# Patient Record
Sex: Female | Born: 1955 | Race: White | Hispanic: No | Smoking: Never smoker
Health system: Southern US, Community
[De-identification: ages and names within clinical notes are randomized; demographics above are authoritative.]

## PROBLEM LIST (undated history)

## (undated) DIAGNOSIS — F419 Anxiety disorder, unspecified: Secondary | ICD-10-CM

## (undated) DIAGNOSIS — R7303 Prediabetes: Secondary | ICD-10-CM

## (undated) DIAGNOSIS — T7840XA Allergy, unspecified, initial encounter: Secondary | ICD-10-CM

## (undated) DIAGNOSIS — E785 Hyperlipidemia, unspecified: Secondary | ICD-10-CM

## (undated) DIAGNOSIS — E119 Type 2 diabetes mellitus without complications: Secondary | ICD-10-CM

## (undated) DIAGNOSIS — J302 Other seasonal allergic rhinitis: Secondary | ICD-10-CM

## (undated) DIAGNOSIS — E669 Obesity, unspecified: Secondary | ICD-10-CM

## (undated) DIAGNOSIS — I10 Essential (primary) hypertension: Secondary | ICD-10-CM

## (undated) HISTORY — DX: Type 2 diabetes mellitus without complications: E11.9

## (undated) HISTORY — PX: COLONOSCOPY: SHX174

## (undated) HISTORY — DX: Hyperlipidemia, unspecified: E78.5

## (undated) HISTORY — DX: Anxiety disorder, unspecified: F41.9

## (undated) HISTORY — DX: Allergy, unspecified, initial encounter: T78.40XA

## (undated) HISTORY — DX: Obesity, unspecified: E66.9

## (undated) HISTORY — DX: Essential (primary) hypertension: I10

## (undated) HISTORY — DX: Prediabetes: R73.03

## (undated) HISTORY — DX: Other seasonal allergic rhinitis: J30.2

---
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
## 1995-05-19 HISTORY — PX: FRACTURE SURGERY: SHX138

## 2000-02-16 ENCOUNTER — Encounter: Admission: RE | Admit: 2000-02-16 | Discharge: 2000-02-16 | Payer: Self-pay | Admitting: Gynecology

## 2000-02-16 ENCOUNTER — Encounter: Payer: Self-pay | Admitting: Gynecology

## 2001-03-09 ENCOUNTER — Encounter: Admission: RE | Admit: 2001-03-09 | Discharge: 2001-03-09 | Payer: Self-pay | Admitting: Gynecology

## 2001-03-09 ENCOUNTER — Encounter: Payer: Self-pay | Admitting: Gynecology

## 2001-07-07 ENCOUNTER — Other Ambulatory Visit: Admission: RE | Admit: 2001-07-07 | Discharge: 2001-07-07 | Payer: Self-pay | Admitting: Family Medicine

## 2002-03-10 ENCOUNTER — Encounter: Payer: Self-pay | Admitting: Family Medicine

## 2002-03-10 ENCOUNTER — Encounter: Admission: RE | Admit: 2002-03-10 | Discharge: 2002-03-10 | Payer: Self-pay | Admitting: Family Medicine

## 2003-02-22 ENCOUNTER — Encounter: Admission: RE | Admit: 2003-02-22 | Discharge: 2003-02-22 | Payer: Self-pay | Admitting: Family Medicine

## 2003-02-22 ENCOUNTER — Encounter: Payer: Self-pay | Admitting: Family Medicine

## 2004-03-14 ENCOUNTER — Encounter: Admission: RE | Admit: 2004-03-14 | Discharge: 2004-03-14 | Payer: Self-pay | Admitting: Family Medicine

## 2005-04-02 ENCOUNTER — Encounter: Admission: RE | Admit: 2005-04-02 | Discharge: 2005-04-02 | Payer: Self-pay | Admitting: Family Medicine

## 2006-04-05 ENCOUNTER — Encounter: Admission: RE | Admit: 2006-04-05 | Discharge: 2006-04-05 | Payer: Self-pay | Admitting: Family Medicine

## 2007-04-08 ENCOUNTER — Ambulatory Visit (HOSPITAL_COMMUNITY): Admission: RE | Admit: 2007-04-08 | Discharge: 2007-04-08 | Payer: Self-pay | Admitting: Family Medicine

## 2007-05-09 ENCOUNTER — Ambulatory Visit: Payer: Self-pay | Admitting: Gastroenterology

## 2007-05-09 LAB — HM COLONOSCOPY: HM Colonoscopy: NORMAL

## 2008-04-09 ENCOUNTER — Ambulatory Visit (HOSPITAL_COMMUNITY): Admission: RE | Admit: 2008-04-09 | Discharge: 2008-04-09 | Payer: Self-pay | Admitting: Family Medicine

## 2009-05-15 ENCOUNTER — Ambulatory Visit (HOSPITAL_COMMUNITY): Admission: RE | Admit: 2009-05-15 | Discharge: 2009-05-15 | Payer: Self-pay | Admitting: Family Medicine

## 2010-06-12 ENCOUNTER — Ambulatory Visit (HOSPITAL_COMMUNITY)
Admission: RE | Admit: 2010-06-12 | Discharge: 2010-06-12 | Payer: Self-pay | Source: Home / Self Care | Attending: Family Medicine | Admitting: Family Medicine

## 2011-05-27 ENCOUNTER — Other Ambulatory Visit (HOSPITAL_COMMUNITY): Payer: Self-pay | Admitting: Family Medicine

## 2011-05-27 DIAGNOSIS — Z1231 Encounter for screening mammogram for malignant neoplasm of breast: Secondary | ICD-10-CM

## 2011-06-26 ENCOUNTER — Ambulatory Visit (HOSPITAL_COMMUNITY)
Admission: RE | Admit: 2011-06-26 | Discharge: 2011-06-26 | Disposition: A | Discharge status: Home / Self Care | Payer: Federal, State, Local not specified - PPO | Source: Ambulatory Visit | Attending: Family Medicine | Admitting: Family Medicine

## 2011-06-26 DIAGNOSIS — Z1231 Encounter for screening mammogram for malignant neoplasm of breast: Secondary | ICD-10-CM | POA: Insufficient documentation

## 2011-07-08 LAB — HM PAP SMEAR: HM PAP: NEGATIVE

## 2012-04-22 LAB — HM PAP SMEAR: HM Pap smear: NEGATIVE

## 2012-10-11 ENCOUNTER — Other Ambulatory Visit (HOSPITAL_COMMUNITY): Payer: Self-pay | Admitting: Family Medicine

## 2012-10-11 DIAGNOSIS — Z1231 Encounter for screening mammogram for malignant neoplasm of breast: Secondary | ICD-10-CM

## 2012-10-18 ENCOUNTER — Ambulatory Visit (HOSPITAL_COMMUNITY)
Admission: RE | Admit: 2012-10-18 | Discharge: 2012-10-18 | Disposition: A | Discharge status: Home / Self Care | Payer: Federal, State, Local not specified - PPO | Source: Ambulatory Visit | Attending: Family Medicine | Admitting: Family Medicine

## 2012-10-18 DIAGNOSIS — Z1231 Encounter for screening mammogram for malignant neoplasm of breast: Secondary | ICD-10-CM | POA: Insufficient documentation

## 2013-10-26 LAB — HM MAMMOGRAPHY

## 2013-11-06 ENCOUNTER — Other Ambulatory Visit (HOSPITAL_COMMUNITY): Payer: Self-pay | Admitting: Family Medicine

## 2013-11-06 DIAGNOSIS — Z1231 Encounter for screening mammogram for malignant neoplasm of breast: Secondary | ICD-10-CM

## 2013-11-09 ENCOUNTER — Ambulatory Visit (HOSPITAL_COMMUNITY)
Admission: RE | Admit: 2013-11-09 | Discharge: 2013-11-09 | Disposition: A | Discharge status: Home / Self Care | Payer: Federal, State, Local not specified - PPO | Source: Ambulatory Visit | Attending: Family Medicine | Admitting: Family Medicine

## 2013-11-09 DIAGNOSIS — Z1231 Encounter for screening mammogram for malignant neoplasm of breast: Secondary | ICD-10-CM | POA: Insufficient documentation

## 2013-11-09 LAB — HM MAMMOGRAPHY

## 2014-08-22 LAB — CBC AND DIFFERENTIAL
HEMATOCRIT: 43 % (ref 36–46)
Hemoglobin: 14.4 g/dL (ref 12.0–16.0)
PLATELETS: 315 10*3/uL (ref 150–399)
WBC: 4.8 10*3/mL

## 2014-08-22 LAB — HEMOGLOBIN A1C: Hgb A1c MFr Bld: 6.3 % — AB (ref 4.0–6.0)

## 2014-08-22 LAB — TSH: TSH: 2.16 u[IU]/mL (ref <=5.90)

## 2014-10-10 LAB — LIPID PANEL
CHOLESTEROL: 204 mg/dL — AB (ref 0–200)
HDL: 75 mg/dL — AB (ref 35–70)
LDL CALC: 114 mg/dL
Triglycerides: 73 mg/dL (ref 40–160)

## 2014-10-10 LAB — BASIC METABOLIC PANEL
BUN: 20 mg/dL (ref 4–21)
CREATININE: 0.6 mg/dL (ref 0.5–1.1)
Glucose: 109 mg/dL
Potassium: 3.9 mmol/L (ref 3.4–5.3)
Sodium: 140 mmol/L (ref 137–147)

## 2014-10-10 LAB — HEPATIC FUNCTION PANEL
ALT: 30 U/L (ref 7–35)
AST: 32 U/L (ref 13–35)

## 2014-12-06 ENCOUNTER — Other Ambulatory Visit: Payer: Self-pay | Admitting: Family Medicine

## 2014-12-06 DIAGNOSIS — Z1231 Encounter for screening mammogram for malignant neoplasm of breast: Secondary | ICD-10-CM

## 2014-12-12 ENCOUNTER — Ambulatory Visit (HOSPITAL_COMMUNITY)
Admission: RE | Admit: 2014-12-12 | Discharge: 2014-12-12 | Disposition: A | Discharge status: Home / Self Care | Payer: Federal, State, Local not specified - PPO | Source: Ambulatory Visit | Attending: Family Medicine | Admitting: Family Medicine

## 2014-12-12 DIAGNOSIS — Z1231 Encounter for screening mammogram for malignant neoplasm of breast: Secondary | ICD-10-CM | POA: Diagnosis not present

## 2014-12-24 ENCOUNTER — Other Ambulatory Visit: Payer: Self-pay | Admitting: Family Medicine

## 2014-12-24 DIAGNOSIS — E785 Hyperlipidemia, unspecified: Secondary | ICD-10-CM

## 2014-12-25 DIAGNOSIS — E785 Hyperlipidemia, unspecified: Secondary | ICD-10-CM | POA: Insufficient documentation

## 2014-12-25 NOTE — Telephone Encounter (Signed)
Last OV 07/2014  Thanks,   -Mikalah Skyles  

## 2014-12-25 NOTE — Telephone Encounter (Signed)
Pt called to make sure [facility] sent the refill request. Thanks TNP

## 2015-02-14 DIAGNOSIS — J309 Allergic rhinitis, unspecified: Secondary | ICD-10-CM | POA: Insufficient documentation

## 2015-02-14 DIAGNOSIS — E559 Vitamin D deficiency, unspecified: Secondary | ICD-10-CM | POA: Insufficient documentation

## 2015-02-14 DIAGNOSIS — F4320 Adjustment disorder, unspecified: Secondary | ICD-10-CM | POA: Insufficient documentation

## 2015-02-14 DIAGNOSIS — I10 Essential (primary) hypertension: Secondary | ICD-10-CM | POA: Insufficient documentation

## 2015-02-14 DIAGNOSIS — E1159 Type 2 diabetes mellitus with other circulatory complications: Secondary | ICD-10-CM | POA: Insufficient documentation

## 2015-02-14 DIAGNOSIS — E7800 Pure hypercholesterolemia, unspecified: Secondary | ICD-10-CM | POA: Insufficient documentation

## 2015-02-14 DIAGNOSIS — F419 Anxiety disorder, unspecified: Secondary | ICD-10-CM | POA: Insufficient documentation

## 2015-02-14 DIAGNOSIS — E119 Type 2 diabetes mellitus without complications: Secondary | ICD-10-CM | POA: Insufficient documentation

## 2015-02-14 DIAGNOSIS — E1169 Type 2 diabetes mellitus with other specified complication: Secondary | ICD-10-CM | POA: Insufficient documentation

## 2015-02-14 DIAGNOSIS — R7303 Prediabetes: Secondary | ICD-10-CM | POA: Insufficient documentation

## 2015-02-14 DIAGNOSIS — G4700 Insomnia, unspecified: Secondary | ICD-10-CM | POA: Insufficient documentation

## 2015-02-15 ENCOUNTER — Ambulatory Visit (INDEPENDENT_AMBULATORY_CARE_PROVIDER_SITE_OTHER): Payer: Federal, State, Local not specified - PPO | Admitting: Family Medicine

## 2015-02-15 ENCOUNTER — Encounter: Payer: Self-pay | Admitting: Family Medicine

## 2015-02-15 VITALS — BP 120/64 | HR 92 | Temp 98.2°F | Resp 16 | Ht 65.5 in | Wt 189.0 lb

## 2015-02-15 DIAGNOSIS — I10 Essential (primary) hypertension: Secondary | ICD-10-CM

## 2015-02-15 DIAGNOSIS — J309 Allergic rhinitis, unspecified: Secondary | ICD-10-CM | POA: Diagnosis not present

## 2015-02-15 DIAGNOSIS — Z23 Encounter for immunization: Secondary | ICD-10-CM

## 2015-02-15 DIAGNOSIS — R7309 Other abnormal glucose: Secondary | ICD-10-CM

## 2015-02-15 DIAGNOSIS — R7303 Prediabetes: Secondary | ICD-10-CM

## 2015-02-15 DIAGNOSIS — E785 Hyperlipidemia, unspecified: Secondary | ICD-10-CM | POA: Diagnosis not present

## 2015-02-15 LAB — POCT GLYCOSYLATED HEMOGLOBIN (HGB A1C)
Est. average glucose Bld gHb Est-mCnc: 123
Hemoglobin A1C: 5.9

## 2015-02-15 NOTE — Progress Notes (Signed)
Subjective:    Patient ID: MAURA BRAATEN, female    DOB: 02-16-1956, 59 y.o.   MRN: [id number]  Hypertension This is a chronic problem. The problem is unchanged. The problem is controlled. Associated symptoms include headaches (occasional possibly due to allergies). Pertinent negatives include no anxiety, blurred vision, chest pain, malaise/fatigue, neck pain, orthopnea, palpitations, peripheral edema, shortness of breath or sweats. Treatments tried: Triamterene-HCTZ 37.5-25. The current treatment provides moderate improvement. There are no compliance problems (Pt has lost 10 pounds since LOV).  There is no history of angina.  Hyperglycemia This is a chronic (Last A1C 08/22/2014 and was 6.3%) problem. Associated symptoms include arthralgias, headaches (occasional possibly due to allergies) and myalgias (Leg cramps; possibly due to Statin?). Pertinent negatives include no abdominal pain, anorexia, change in bowel habit, chest pain, diaphoresis, fatigue, nausea, neck pain, numbness, urinary symptoms or visual change.   Is on medication for cholesterol.   Not sure if causing cramps.  Does want to continue the medication.  Not severe enough to stop.    Review of Systems  Constitutional: Negative for malaise/fatigue, diaphoresis and fatigue.  Eyes: Negative for blurred vision.  Respiratory: Negative for shortness of breath.   Cardiovascular: Negative for chest pain, palpitations and orthopnea.  Gastrointestinal: Negative for nausea, abdominal pain, anorexia and change in bowel habit.  Musculoskeletal: Positive for myalgias (Leg cramps; possibly due to Statin?) and arthralgias. Negative for neck pain.  Neurological: Positive for headaches (occasional possibly due to allergies). Negative for numbness.   BP 120/64 mmHg  Pulse 92  Temp(Src) 98.2 F (36.8 C) (Oral)  Resp 16  Ht 5' 5.5" (1.664 m)  Wt 189 lb (85.73 kg)  BMI 30.96 kg/m2   Patient Active Problem List   Diagnosis Date Noted  .  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 Allergic rhinitis 02/14/2015  . Anxiety 02/14/2015  . Hypercholesteremia 02/14/2015  . BP (high blood pressure) 02/14/2015  . Cannot sleep 02/14/2015  . Borderline diabetes 02/14/2015  . Avitaminosis D 02/14/2015  . Hyperlipemia 12/25/2014   Past Medical History  Diagnosis Date  . Anxiety   . Hypertension   . Hyperlipidemia   . Allergy    Current Outpatient Prescriptions on File Prior to Visit  Medication Sig  . aspirin 81 MG tablet Take 1 tablet by mouth daily.  . B Complex Vitamins (VITAMIN B COMPLEX PO) Take 1 tablet by mouth daily.  . Cholecalciferol 10000 UNITS CAPS Take 1 Can by mouth daily.  . fluticasone (FLONASE) 50 MCG/ACT nasal spray Place 2 sprays into the nose daily.  Marland Kitchen loratadine (CLARITIN) 10 MG tablet Take 1 tablet by mouth daily.  . MULTIPLE VITAMIN PO Take 1 tablet by mouth daily.  Marland Kitchen omega-3 fish oil (MAXEPA) 1000 MG CAPS capsule Take 1 capsule by mouth daily.  . simvastatin (ZOCOR) 20 MG tablet TAKE ONE TABLET BY MOUTH AT BEDTIME  . triamterene-hydrochlorothiazide (MAXZIDE-25) 37.5-25 MG tablet Take 1 tablet by mouth daily.   No current facility-administered medications on file prior to visit.   Allergies  Allergen Reactions  . Codeine     Itching  . Lisinopril Cough   Past Surgical History  Procedure Laterality Date  . Cesarean section    . Mouth surgery  1976    WISDOM TEETH REMOVED  . Fracture surgery Right 1997    PLATES AND PINS   Social History   Social History  . Marital Status: Married    Spouse Name: N/A  . Number of Children: N/A  .  Years of Education: N/A   Occupational History  . Not on file.   Social History Main Topics  . Smoking status: Never Smoker   . Smokeless tobacco: Never Used  . Alcohol Use: Yes     Comment: rare glass of wine  . Drug Use: No  . Sexual Activity: Not on file   Other Topics Concern  . Not on file   Social History Narrative   Family History  Problem Relation  Age of Onset  . Diabetes Mother   . Hyperlipidemia Mother   . Cancer Father   . Congestive Heart Failure Father   . CVA Maternal Grandmother   . Diabetes Paternal Grandfather         Objective:   Physical Exam  Constitutional: She is oriented to person, place, and time. She appears well-developed and well-nourished.  Eyes: Conjunctivae and EOM are normal. Pupils are equal, round, and reactive to light. Right eye exhibits no discharge. Left eye exhibits no discharge.  Neck: Normal range of motion. Neck supple.  Cardiovascular: Normal rate and regular rhythm.   Pulmonary/Chest: Effort normal and breath sounds normal.  Neurological: She is alert and oriented to person, place, and time.  Psychiatric: She has a normal mood and affect. Her behavior is normal. Judgment and thought content normal.  BP 120/64 mmHg  Pulse 92  Temp(Src) 98.2 F (36.8 C) (Oral)  Resp 16  Ht 5' 5.5" (1.664 m)  Wt 189 lb (85.73 kg)  BMI 30.96 kg/m2     Assessment & Plan:  1. Borderline diabetes Improved. Continue current lifestyle changes. Recheck in 6 months. - POCT glycosylated hemoglobin (Hb A1C) Results for orders placed or performed in visit on 02/15/15  POCT glycosylated hemoglobin (Hb A1C)  Result Value Ref Range   Hemoglobin A1C 5.9    Est. average glucose Bld gHb Est-mCnc 123    vi 2. Flu vaccine need Given today.  - Flu Vaccine QUAD 36+ mos IM  3. Essential hypertension Stable. Continue current medication.   4. Allergic rhinitis, unspecified allergic rhinitis type Stable. Continue current medication. Call if worsens.   5. For cholesterol- Continue medication. Call if cramps worsens.  Lorie Phenix, MD

## 2015-03-02 ENCOUNTER — Other Ambulatory Visit: Payer: Self-pay | Admitting: Family Medicine

## 2015-03-02 DIAGNOSIS — I10 Essential (primary) hypertension: Secondary | ICD-10-CM

## 2015-03-02 NOTE — Telephone Encounter (Signed)
Pt contacted office for refill request on the following medications: triamterene-hydrochlorothiazide (MAXZIDE-25) 37.5-25 MG tablet to [facility] [address]. Thanks TNP

## 2015-03-02 NOTE — Telephone Encounter (Signed)
Refilled medication per Dr. Elease HashimotoMaloney.

## 2015-06-22 ENCOUNTER — Other Ambulatory Visit: Payer: Self-pay | Admitting: Family Medicine

## 2015-06-22 DIAGNOSIS — E7800 Pure hypercholesterolemia, unspecified: Secondary | ICD-10-CM

## 2015-06-24 ENCOUNTER — Other Ambulatory Visit: Payer: Self-pay | Admitting: Family Medicine

## 2015-06-24 DIAGNOSIS — E7800 Pure hypercholesterolemia, unspecified: Secondary | ICD-10-CM

## 2015-06-24 MED ORDER — SIMVASTATIN 20 MG PO TABS: 20.0000 mg | ORAL_TABLET | Freq: Every day | ORAL | Status: DC

## 2015-06-24 NOTE — Telephone Encounter (Signed)
Pt call ed for refill simvastatin (ZOCOR) 20 MG tablet .  She wants 90 days  [facility] [address]  Pt call back [phone]  Thanks Barth Kirks

## 2015-08-05 ENCOUNTER — Ambulatory Visit (INDEPENDENT_AMBULATORY_CARE_PROVIDER_SITE_OTHER): Payer: Federal, State, Local not specified - PPO | Admitting: Family Medicine

## 2015-08-05 ENCOUNTER — Encounter: Payer: Self-pay | Admitting: Family Medicine

## 2015-08-05 VITALS — BP 136/72 | HR 104 | Temp 99.1°F | Resp 16 | Wt 196.0 lb

## 2015-08-05 DIAGNOSIS — J069 Acute upper respiratory infection, unspecified: Secondary | ICD-10-CM

## 2015-08-05 MED ORDER — HYDROCODONE-HOMATROPINE 5-1.5 MG/5ML PO SYRP: 5.0000 mL | ORAL_SOLUTION | Freq: Three times a day (TID) | ORAL | Status: DC | PRN

## 2015-08-05 NOTE — Progress Notes (Signed)
Subjective:    Patient ID: Krista Cooper, female    DOB: [birth year], 60 y.o.   MRN: [id number]  URI  The current episode started in the past 7 days (x 5 days). Progression since onset: cough is worse at night. Associated symptoms include congestion, coughing (dry), diarrhea, headaches, neck pain, a plugged ear sensation, rhinorrhea, sinus pain, sneezing and a sore throat. Pertinent negatives include no abdominal pain, chest pain, dysuria, ear pain, joint pain, joint swelling, nausea, swollen glands, vomiting or wheezing. She has tried acetaminophen (Robitussin, cough drops) for the symptoms. The treatment provided moderate relief.      Review of Systems  HENT: Positive for congestion, rhinorrhea, sneezing and sore throat. Negative for ear pain.   Respiratory: Positive for cough (dry). Negative for wheezing.   Cardiovascular: Negative for chest pain.  Gastrointestinal: Positive for diarrhea. Negative for nausea, vomiting and abdominal pain.  Genitourinary: Negative for dysuria.  Musculoskeletal: Positive for neck pain. Negative for joint pain.  Neurological: Positive for headaches.   BP 136/72 mmHg  Pulse 104  Temp(Src) 99.1 F (37.3 C) (Oral)  Resp 16  Wt 196 lb (88.905 kg)  SpO2 98%   Patient Active Problem List   Diagnosis Date Noted  . 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 Allergic rhinitis 02/14/2015  . Anxiety 02/14/2015  . Hypercholesteremia 02/14/2015  . BP (high blood pressure) 02/14/2015  . Cannot sleep 02/14/2015  . Borderline diabetes 02/14/2015  . Avitaminosis D 02/14/2015  . Hyperlipemia 12/25/2014   Past Medical History  Diagnosis Date  . Anxiety   . Hypertension   . Hyperlipidemia   . Allergy    Current Outpatient Prescriptions on File Prior to Visit  Medication Sig  . aspirin 81 MG tablet Take 1 tablet by mouth daily.  . B Complex Vitamins (VITAMIN B COMPLEX PO) Take 1 tablet by mouth daily.  . Cholecalciferol 10000 UNITS CAPS Take 1 Can by mouth  daily.  . fluticasone (FLONASE) 50 MCG/ACT nasal spray Place 2 sprays into the nose daily.  Marland Kitchen loratadine (CLARITIN) 10 MG tablet Take 1 tablet by mouth every other day.   . MULTIPLE VITAMIN PO Take 1 tablet by mouth daily.  Marland Kitchen omega-3 fish oil (MAXEPA) 1000 MG CAPS capsule Take 1 capsule by mouth daily.  . simvastatin (ZOCOR) 20 MG tablet Take 1 tablet (20 mg total) by mouth at bedtime.  . triamterene-hydrochlorothiazide (MAXZIDE-25) 37.5-25 MG tablet TAKE ONE TABLET BY MOUTH ONCE DAILY   No current facility-administered medications on file prior to visit.   Allergies  Allergen Reactions  . Codeine     Itching; Can take Cough Syrup  . Lisinopril Cough   Past Surgical History  Procedure Laterality Date  . Cesarean section    . Mouth surgery  1976    WISDOM TEETH REMOVED  . Fracture surgery Right 1997    PLATES AND PINS   Social History   Social History  . Marital Status: Married    Spouse Name: N/A  . Number of Children: N/A  . Years of Education: N/A   Occupational History  . Not on file.   Social History Main Topics  . Smoking status: Never Smoker   . Smokeless tobacco: Never Used  . Alcohol Use: Yes     Comment: rare glass of wine  . Drug Use: No  . Sexual Activity: Not on file   Other Topics Concern  . Not on file   Social History Narrative   Family History  Problem  Relation Age of Onset  . Diabetes Mother   . Hyperlipidemia Mother   . Cancer Father   . Congestive Heart Failure Father   . CVA Maternal Grandmother   . Diabetes Paternal Grandfather        Objective:   Physical Exam  Constitutional: She appears well-developed and well-nourished.  HENT:  Right Ear: External ear normal.  Left Ear: External ear normal.  Nose: Nose normal. Right sinus exhibits no maxillary sinus tenderness and no frontal sinus tenderness. Left sinus exhibits no maxillary sinus tenderness and no frontal sinus tenderness.  Cardiovascular: Normal rate and regular rhythm.     Pulmonary/Chest: Effort normal and breath sounds normal.  Psychiatric: She has a normal mood and affect. Her behavior is normal.    BP 136/72 mmHg  Pulse 104  Temp(Src) 99.1 F (37.3 C) (Oral)  Resp 16  Wt 196 lb (88.905 kg)  SpO2 98%     Assessment & Plan:  1. Upper respiratory infection New problem. Continue OTC medication and add Hycodan at night to help her sleep. Patient instructed to call back if condition worsens or does not improve, including fever.   - HYDROcodone-homatropine (HYCODAN) 5-1.5 MG/5ML syrup; Take 5 mLs by mouth every 8 (eight) hours as needed for cough.  Dispense: 120 mL; Refill: 0   Patient was seen and examined by Leo GrosserNancy J. Laquashia Mergenthaler, MD, and note scribed by Allene DillonEmily Drozdowski, CMA.  I have reviewed the document for accuracy and completeness and I agree with above. Leo Grosser- Kirstyn Lean J. Aundrea Horace, MD

## 2015-08-07 ENCOUNTER — Telehealth: Payer: Self-pay | Admitting: Family Medicine

## 2015-08-07 NOTE — Telephone Encounter (Signed)
Suspect a virus. Thanks.

## 2015-08-07 NOTE — Telephone Encounter (Signed)
Pt states she started throwing up Tuesday afternoon about 3 times from 4pm to 7pm.  Pt is not throwing up this morning.  Pt spoke to the pharmacy last night and he did not think it was from the cough medication but maybe a virus.  Pt is asking if Dr Dorthy CoolerMoloney thinks it was from the cough medication or a virus?  [id number]#[phone]/WJCB#[phone]/MW

## 2015-08-07 NOTE — Telephone Encounter (Signed)
Pt advised.   Thanks,   -Laura  

## 2015-08-12 ENCOUNTER — Encounter: Payer: Federal, State, Local not specified - PPO | Admitting: Family Medicine

## 2015-09-16 ENCOUNTER — Encounter: Payer: Self-pay | Admitting: Family Medicine

## 2015-09-16 ENCOUNTER — Ambulatory Visit (INDEPENDENT_AMBULATORY_CARE_PROVIDER_SITE_OTHER): Payer: Federal, State, Local not specified - PPO | Admitting: Family Medicine

## 2015-09-16 VITALS — BP 138/88 | HR 64 | Temp 98.3°F | Resp 16 | Ht 65.5 in | Wt 196.0 lb

## 2015-09-16 DIAGNOSIS — R7303 Prediabetes: Secondary | ICD-10-CM | POA: Diagnosis not present

## 2015-09-16 DIAGNOSIS — Z0000 Encounter for general adult medical examination without abnormal findings: Secondary | ICD-10-CM | POA: Diagnosis not present

## 2015-09-16 DIAGNOSIS — E7800 Pure hypercholesterolemia, unspecified: Secondary | ICD-10-CM

## 2015-09-16 DIAGNOSIS — E559 Vitamin D deficiency, unspecified: Secondary | ICD-10-CM | POA: Diagnosis not present

## 2015-09-16 DIAGNOSIS — F419 Anxiety disorder, unspecified: Secondary | ICD-10-CM | POA: Diagnosis not present

## 2015-09-16 DIAGNOSIS — I10 Essential (primary) hypertension: Secondary | ICD-10-CM | POA: Diagnosis not present

## 2015-09-16 DIAGNOSIS — Z1239 Encounter for other screening for malignant neoplasm of breast: Secondary | ICD-10-CM

## 2015-09-16 DIAGNOSIS — Z1211 Encounter for screening for malignant neoplasm of colon: Secondary | ICD-10-CM | POA: Diagnosis not present

## 2015-09-16 DIAGNOSIS — J302 Other seasonal allergic rhinitis: Secondary | ICD-10-CM

## 2015-09-16 LAB — POCT URINALYSIS DIPSTICK
Bilirubin, UA: NEGATIVE
Blood, UA: NEGATIVE
Glucose, UA: NEGATIVE
KETONES UA: NEGATIVE
Leukocytes, UA: NEGATIVE
Nitrite, UA: NEGATIVE
PH UA: 7.5
PROTEIN UA: NEGATIVE
UROBILINOGEN UA: 0.2

## 2015-09-16 LAB — IFOBT (OCCULT BLOOD): IMMUNOLOGICAL FECAL OCCULT BLOOD TEST: NEGATIVE

## 2015-09-16 MED ORDER — TRIAMTERENE-HCTZ 37.5-25 MG PO TABS: 1.0000 | ORAL_TABLET | Freq: Every day | ORAL | Status: DC

## 2015-09-16 MED ORDER — SIMVASTATIN 20 MG PO TABS: 20.0000 mg | ORAL_TABLET | Freq: Every day | ORAL | Status: DC

## 2015-09-16 MED ORDER — FLUTICASONE PROPIONATE 50 MCG/ACT NA SUSP: 2.0000 | Freq: Every day | NASAL | Status: DC

## 2015-09-16 NOTE — Progress Notes (Signed)
Patient ID: KALI DEADWYLER, female   DOB: Sep 22, 1955, 59 y.o.   MRN: [id number]       Patient: Krista Cooper, Female    DOB: Nov 23, 1955, 60 y.o.   MRN: [id number] Visit Date: 09/16/2015  Today's Provider: Lorie Phenix, MD   Chief Complaint  Patient presents with  . Annual Exam   Subjective:    Annual physical exam CARNETTA LOSADA is a 60 y.o. female who presents today for health maintenance and complete physical. She feels well. She reports exercising daily. She reports she is sleeping well.  08/10/14 CPE 04/22/12 Pap-neg; HPV-neg 12/12/14 Mammogram-BI-RADS 1 05/09/07 Colonoscopy-normal -----------------------------------------------------------------   Review of Systems  Constitutional: Positive for diaphoresis.  HENT: Positive for sneezing.   Eyes: Negative.   Respiratory: Negative.   Cardiovascular: Negative.   Gastrointestinal: Negative.   Endocrine: Negative.   Genitourinary: Negative.   Musculoskeletal: Positive for arthralgias.  Skin: Negative.   Allergic/Immunologic: Positive for environmental allergies.  Neurological: Negative.   Hematological: Negative.   Psychiatric/Behavioral: Negative.     Social History      She  reports that she has never smoked. She has never used smokeless tobacco. She reports that she drinks alcohol. She reports that she does not use illicit drugs.       Social History   Social History  . Marital Status: Married    Spouse Name: N/A  . Number of Children: N/A  . Years of Education: N/A   Social History Main Topics  . Smoking status: Never Smoker   . Smokeless tobacco: Never Used  . Alcohol Use: Yes     Comment: rare glass of wine  . Drug Use: No  . Sexual Activity: Not Asked   Other Topics Concern  . None   Social History Narrative    Past Medical History  Diagnosis Date  . Anxiety   . Hypertension   . Hyperlipidemia   . Allergy      Patient Active Problem List   Diagnosis Date Noted  . Adaptation  reaction 02/14/2015  . Allergic rhinitis 02/14/2015  . Anxiety 02/14/2015  . Hypercholesteremia 02/14/2015  . BP (high blood pressure) 02/14/2015  . Cannot sleep 02/14/2015  . Borderline diabetes 02/14/2015  . Avitaminosis D 02/14/2015  . Hyperlipemia 12/25/2014    Past Surgical History  Procedure Laterality Date  . Cesarean section    . Mouth surgery  1976    WISDOM TEETH REMOVED  . Fracture surgery Right 1997    PLATES AND PINS    Family History        Family Status  Relation Status Death Age  . Mother Deceased   . Father Deceased 53    LUNG CANCER  . Paternal Grandfather Deceased     MI  . Daughter Deceased     MVA        Her family history includes COPD in her mother; CVA in her maternal grandmother; Cancer in her father; Congestive Heart Failure in her father; Diabetes in her mother and paternal grandfather; Hyperlipidemia in her mother; Hypertension in her father and mother.    Allergies  Allergen Reactions  . Codeine     Itching; Can take Cough Syrup  . Lisinopril Cough    Previous Medications   ASPIRIN 81 MG TABLET    Take 1 tablet by mouth daily.   B COMPLEX VITAMINS (VITAMIN B COMPLEX PO)    Take 1 tablet by mouth daily.   CHOLECALCIFEROL 10000 UNITS CAPS  Take 1 Can by mouth daily.   FLUTICASONE (FLONASE) 50 MCG/ACT NASAL SPRAY    Place 2 sprays into the nose daily.   HYDROCODONE-HOMATROPINE (HYCODAN) 5-1.5 MG/5ML SYRUP    Take 5 mLs by mouth every 8 (eight) hours as needed for cough.   LORATADINE (CLARITIN) 10 MG TABLET    Take 1 tablet by mouth every other day.    MULTIPLE VITAMIN PO    Take 1 tablet by mouth daily.   OMEGA-3 FISH OIL (MAXEPA) 1000 MG CAPS CAPSULE    Take 1 capsule by mouth daily.   SIMVASTATIN (ZOCOR) 20 MG TABLET    Take 1 tablet (20 mg total) by mouth at bedtime.   TRIAMTERENE-HYDROCHLOROTHIAZIDE (MAXZIDE-25) 37.5-25 MG TABLET    TAKE ONE TABLET BY MOUTH ONCE DAILY    Patient Care Team: Lorie PhenixNancy Don Tiu, MD as PCP - General  (Family Medicine)     Objective:   Vitals: BP 138/88 mmHg  Pulse 64  Temp(Src) 98.3 F (36.8 C) (Oral)  Resp 16  Ht 5' 5.5" (1.664 m)  Wt 196 lb (88.905 kg)  BMI 32.11 kg/m2   Physical Exam  Constitutional: She is oriented to person, place, and time. She appears well-developed and well-nourished.  HENT:  Head: Normocephalic and atraumatic.  Right Ear: Tympanic membrane, external ear and ear canal normal.  Left Ear: Tympanic membrane, external ear and ear canal normal.  Nose: Nose normal.  Mouth/Throat: Uvula is midline, oropharynx is clear and moist and mucous membranes are normal.  Eyes: Conjunctivae, EOM and lids are normal. Pupils are equal, round, and reactive to light.  Neck: Trachea normal and normal range of motion. Neck supple. Carotid bruit is not present. No thyroid mass and no thyromegaly present.  Cardiovascular: Normal rate, regular rhythm and normal heart sounds.   Pulmonary/Chest: Effort normal and breath sounds normal.  Abdominal: Soft. Normal appearance and bowel sounds are normal. There is no hepatosplenomegaly. There is no tenderness.  Genitourinary: Rectum normal. No breast swelling, tenderness or discharge.  Musculoskeletal: Normal range of motion.  Lymphadenopathy:    She has no cervical adenopathy.    She has no axillary adenopathy.  Neurological: She is alert and oriented to person, place, and time. She has normal strength. No cranial nerve deficit.  Skin: Skin is warm, dry and intact.  Psychiatric: She has a normal mood and affect. Her speech is normal and behavior is normal. Judgment and thought content normal. Cognition and memory are normal.     Depression Screen PHQ 2/9 Scores 09/16/2015  PHQ - 2 Score 0      Assessment & Plan:     Routine Health Maintenance and Physical Exam  Exercise Activities and Dietary recommendations Goals    None      Immunization History  Administered Date(s) Administered  . Influenza,inj,Quad PF,36+ Mos  02/15/2015  . Tdap 10/15/2005       1. Annual physical exam Stable. Patient advised to continue eating healthy and exercise daily. - POCT urinalysis dipstick  2. Breast cancer screening - MM DIGITAL SCREENING BILATERAL; Future  3. Colon cancer screening - IFOBT POC (occult bld, rslt in office)  4. Essential hypertension - CBC with Differential/Platelet - Comprehensive metabolic panel - triamterene-hydrochlorothiazide (MAXZIDE-25) 37.5-25 MG tablet; Take 1 tablet by mouth daily.  Dispense: 90 tablet; Refill: 3  5. Borderline diabetes - Hemoglobin A1c  6. Anxiety - TSH  7. Avitaminosis D - VITAMIN D 25 Hydroxy (Vit-D Deficiency, Fractures)  8. Hypercholesteremia - Lipid Panel With  LDL/HDL Ratio - simvastatin (ZOCOR) 20 MG tablet; Take 1 tablet (20 mg total) by mouth at bedtime.  Dispense: 90 tablet; Refill: 3  9. Other seasonal allergic rhinitis - fluticasone (FLONASE) 50 MCG/ACT nasal spray; Place 2 sprays into both nostrils daily.  Dispense: 16 g; Refill: 5   Patient seen and examined by Dr. Leo Grosser, and note scribed by Liz Beach. Dimas, CMA.  I have reviewed the document for accuracy and completeness and I agree with above. Leo Grosser, MD    Lorie Phenix, MD   --------------------------------------------------------------------

## 2015-09-25 DIAGNOSIS — E7800 Pure hypercholesterolemia, unspecified: Secondary | ICD-10-CM | POA: Diagnosis not present

## 2015-09-25 DIAGNOSIS — E559 Vitamin D deficiency, unspecified: Secondary | ICD-10-CM | POA: Diagnosis not present

## 2015-09-25 DIAGNOSIS — R7303 Prediabetes: Secondary | ICD-10-CM | POA: Diagnosis not present

## 2015-09-25 DIAGNOSIS — F419 Anxiety disorder, unspecified: Secondary | ICD-10-CM | POA: Diagnosis not present

## 2015-09-25 DIAGNOSIS — I10 Essential (primary) hypertension: Secondary | ICD-10-CM | POA: Diagnosis not present

## 2015-09-26 ENCOUNTER — Telehealth: Payer: Self-pay

## 2015-09-26 DIAGNOSIS — J302 Other seasonal allergic rhinitis: Secondary | ICD-10-CM

## 2015-09-26 LAB — TSH: TSH: 3 u[IU]/mL (ref 0.450–4.500)

## 2015-09-26 LAB — CBC WITH DIFFERENTIAL/PLATELET
BASOS: 1 %
Basophils Absolute: 0 10*3/uL (ref 0.0–0.2)
EOS (ABSOLUTE): 0.1 10*3/uL (ref 0.0–0.4)
Eos: 2 %
Hematocrit: 41.2 % (ref 34.0–46.6)
Hemoglobin: 13.8 g/dL (ref 11.1–15.9)
Immature Grans (Abs): 0 10*3/uL (ref 0.0–0.1)
Immature Granulocytes: 0 %
Lymphocytes Absolute: 1.5 10*3/uL (ref 0.7–3.1)
Lymphs: 35 %
MCH: 29.4 pg (ref 26.6–33.0)
MCHC: 33.5 g/dL (ref 31.5–35.7)
MCV: 88 fL (ref 79–97)
MONOS ABS: 0.4 10*3/uL (ref 0.1–0.9)
Monocytes: 10 %
NEUTROS ABS: 2.3 10*3/uL (ref 1.4–7.0)
Neutrophils: 52 %
PLATELETS: 328 10*3/uL (ref 150–379)
RBC: 4.7 x10E6/uL (ref 3.77–5.28)
RDW: 14.6 % (ref 12.3–15.4)
WBC: 4.3 10*3/uL (ref 3.4–10.8)

## 2015-09-26 LAB — LIPID PANEL WITH LDL/HDL RATIO
CHOLESTEROL TOTAL: 183 mg/dL (ref 100–199)
HDL: 75 mg/dL (ref >39)
LDL CALC: 97 mg/dL (ref 0–99)
LDl/HDL Ratio: 1.3 ratio units (ref 0.0–3.2)
TRIGLYCERIDES: 54 mg/dL (ref 0–149)
VLDL CHOLESTEROL CAL: 11 mg/dL (ref 5–40)

## 2015-09-26 LAB — COMPREHENSIVE METABOLIC PANEL
A/G RATIO: 2.6 — AB (ref 1.2–2.2)
ALT: 27 IU/L (ref 0–32)
AST: 31 IU/L (ref 0–40)
Albumin: 4.5 g/dL (ref 3.5–5.5)
Alkaline Phosphatase: 62 IU/L (ref 39–117)
BILIRUBIN TOTAL: 0.6 mg/dL (ref 0.0–1.2)
BUN/Creatinine Ratio: 18 (ref 9–23)
BUN: 12 mg/dL (ref 6–24)
CHLORIDE: 96 mmol/L (ref 96–106)
CO2: 27 mmol/L (ref 18–29)
Calcium: 9.5 mg/dL (ref 8.7–10.2)
Creatinine, Ser: 0.65 mg/dL (ref 0.57–1.00)
GFR calc non Af Amer: 97 mL/min/{1.73_m2} (ref >59)
GFR, EST AFRICAN AMERICAN: 112 mL/min/{1.73_m2} (ref >59)
Globulin, Total: 1.7 g/dL (ref 1.5–4.5)
Glucose: 107 mg/dL — ABNORMAL HIGH (ref 65–99)
POTASSIUM: 4 mmol/L (ref 3.5–5.2)
Sodium: 138 mmol/L (ref 134–144)
Total Protein: 6.2 g/dL (ref 6.0–8.5)

## 2015-09-26 LAB — HEMOGLOBIN A1C
Est. average glucose Bld gHb Est-mCnc: 137 mg/dL
Hgb A1c MFr Bld: 6.4 % — ABNORMAL HIGH (ref 4.8–5.6)

## 2015-09-26 LAB — VITAMIN D 25 HYDROXY (VIT D DEFICIENCY, FRACTURES): VIT D 25 HYDROXY: 29.8 ng/mL — AB (ref 30.0–100.0)

## 2015-09-26 MED ORDER — FLUTICASONE PROPIONATE 50 MCG/ACT NA SUSP: 2.0000 | Freq: Every day | NASAL | Status: DC

## 2015-09-26 NOTE — Telephone Encounter (Signed)
-----   Message from Lorie PhenixNancy Maloney, MD sent at 09/26/2015  2:26 PM EDT ----- Labs stable. Blood sugar not as good as previous. Recommend treat with lifestyle modification and recheck in 3 to 4 months.  Vitamin D borderline. May sure taking Vit D 1000 daily.  Thanks.

## 2015-09-26 NOTE — Telephone Encounter (Signed)
Advised pt of lab results. Pt verbally acknowledges understanding. Pt requested refill for Flonase. Sent to mail-order pharmacy per verbal ok from Dr. Elease HashimotoMaloney. Allene DillonEmily Drozdowski, CMA

## 2015-11-01 ENCOUNTER — Other Ambulatory Visit: Payer: Self-pay

## 2015-11-01 DIAGNOSIS — J302 Other seasonal allergic rhinitis: Secondary | ICD-10-CM

## 2015-11-01 MED ORDER — FLUTICASONE PROPIONATE 50 MCG/ACT NA SUSP: 2.0000 | Freq: Every day | NASAL | Status: DC

## 2015-11-01 NOTE — Addendum Note (Signed)
Addended by: Kavin LeechWALSH, Kylei Purington E on: 11/01/2015 02:57 PM   Modules accepted: Orders

## 2015-11-04 ENCOUNTER — Other Ambulatory Visit: Payer: Self-pay

## 2015-11-04 DIAGNOSIS — J302 Other seasonal allergic rhinitis: Secondary | ICD-10-CM

## 2015-11-04 MED ORDER — FLUTICASONE PROPIONATE 50 MCG/ACT NA SUSP: 2.0000 | Freq: Every day | NASAL | Status: DC

## 2015-12-26 ENCOUNTER — Ambulatory Visit
Admission: RE | Admit: 2015-12-26 | Discharge: 2015-12-26 | Disposition: A | Discharge status: Home / Self Care | Payer: Federal, State, Local not specified - PPO | Source: Ambulatory Visit | Attending: Family Medicine | Admitting: Family Medicine

## 2015-12-26 DIAGNOSIS — Z1239 Encounter for other screening for malignant neoplasm of breast: Secondary | ICD-10-CM

## 2015-12-26 DIAGNOSIS — Z1231 Encounter for screening mammogram for malignant neoplasm of breast: Secondary | ICD-10-CM | POA: Diagnosis not present

## 2015-12-31 ENCOUNTER — Other Ambulatory Visit: Payer: Self-pay | Admitting: Family Medicine

## 2015-12-31 DIAGNOSIS — R928 Other abnormal and inconclusive findings on diagnostic imaging of breast: Secondary | ICD-10-CM

## 2016-01-01 ENCOUNTER — Other Ambulatory Visit: Payer: Self-pay | Admitting: Physician Assistant

## 2016-01-01 DIAGNOSIS — R928 Other abnormal and inconclusive findings on diagnostic imaging of breast: Secondary | ICD-10-CM

## 2016-01-02 ENCOUNTER — Ambulatory Visit
Admission: RE | Admit: 2016-01-02 | Discharge: 2016-01-02 | Disposition: A | Discharge status: Home / Self Care | Payer: Federal, State, Local not specified - PPO | Source: Ambulatory Visit | Attending: Physician Assistant | Admitting: Physician Assistant

## 2016-01-02 DIAGNOSIS — R928 Other abnormal and inconclusive findings on diagnostic imaging of breast: Secondary | ICD-10-CM

## 2016-01-02 DIAGNOSIS — N63 Unspecified lump in breast: Secondary | ICD-10-CM | POA: Diagnosis not present

## 2016-01-16 DIAGNOSIS — K080 Exfoliation of teeth due to systemic causes: Secondary | ICD-10-CM | POA: Diagnosis not present

## 2016-03-19 ENCOUNTER — Ambulatory Visit (INDEPENDENT_AMBULATORY_CARE_PROVIDER_SITE_OTHER): Payer: Federal, State, Local not specified - PPO | Admitting: Physician Assistant

## 2016-03-19 ENCOUNTER — Encounter: Payer: Self-pay | Admitting: Physician Assistant

## 2016-03-19 VITALS — BP 126/70 | HR 80 | Temp 98.4°F | Resp 16 | Wt 193.0 lb

## 2016-03-19 DIAGNOSIS — I10 Essential (primary) hypertension: Secondary | ICD-10-CM

## 2016-03-19 DIAGNOSIS — Z23 Encounter for immunization: Secondary | ICD-10-CM

## 2016-03-19 DIAGNOSIS — R7303 Prediabetes: Secondary | ICD-10-CM | POA: Diagnosis not present

## 2016-03-19 DIAGNOSIS — E7800 Pure hypercholesterolemia, unspecified: Secondary | ICD-10-CM | POA: Diagnosis not present

## 2016-03-19 LAB — POCT GLYCOSYLATED HEMOGLOBIN (HGB A1C)
Est. average glucose Bld gHb Est-mCnc: 128
Hemoglobin A1C: 6.1

## 2016-03-19 NOTE — Patient Instructions (Signed)
Prediabetes Eating Plan Prediabetes--also called impaired glucose tolerance or impaired fasting glucose--is a condition that causes blood sugar (blood glucose) levels to be higher than normal. Following a healthy diet can help to keep prediabetes under control. It can also help to lower the risk of type 2 diabetes and heart disease, which are increased in people who have prediabetes. Along with regular exercise, a healthy diet:  Promotes weight loss.  Helps to control blood sugar levels.  Helps to improve the way that the body uses insulin. WHAT DO I NEED TO KNOW ABOUT THIS EATING PLAN?  Use the glycemic index (GI) to plan your meals. The index tells you how quickly a food will raise your blood sugar. Choose low-GI foods. These foods take a longer time to raise blood sugar.  Pay close attention to the amount of carbohydrates in the food that you eat. Carbohydrates increase blood sugar levels.  Keep track of how many calories you take in. Eating the right amount of calories will help you to achieve a healthy weight. Losing about 7 percent of your starting weight can help to prevent type 2 diabetes.  You may want to follow a Mediterranean diet. This diet includes a lot of vegetables, lean meats or fish, whole grains, fruits, and healthy oils and fats. WHAT FOODS CAN I EAT? Grains Whole grains, such as whole-wheat or whole-grain breads, crackers, cereals, and pasta. Unsweetened oatmeal. Bulgur. Barley. Quinoa. Brown rice. Corn or whole-wheat flour tortillas or taco shells. Vegetables Lettuce. Spinach. Peas. Beets. Cauliflower. Cabbage. Broccoli. Carrots. Tomatoes. Squash. Eggplant. Herbs. Peppers. Onions. Cucumbers. Brussels sprouts. Fruits Berries. Bananas. Apples. Oranges. Grapes. Papaya. Mango. Pomegranate. Kiwi. Grapefruit. Cherries. Meats and Other Protein Sources Seafood. Lean meats, such as chicken and turkey or lean cuts of pork and beef. Tofu. Eggs. Nuts. Beans. Dairy Low-fat or  fat-free dairy products, such as yogurt, cottage cheese, and cheese. Beverages Water. Tea. Coffee. Sugar-free or diet soda. Seltzer water. Milk. Milk alternatives, such as soy or almond milk. Condiments Mustard. Relish. Low-fat, low-sugar ketchup. Low-fat, low-sugar barbecue sauce. Low-fat or fat-free mayonnaise. Sweets and Desserts Sugar-free or low-fat pudding. Sugar-free or low-fat ice cream and other frozen treats. Fats and Oils Avocado. Walnuts. Olive oil. The items listed above may not be a complete list of recommended foods or beverages. Contact your dietitian for more options.  WHAT FOODS ARE NOT RECOMMENDED? Grains Refined white flour and flour products, such as bread, pasta, snack foods, and cereals. Beverages Sweetened drinks, such as sweet iced tea and soda. Sweets and Desserts Baked goods, such as cake, cupcakes, pastries, cookies, and cheesecake. The items listed above may not be a complete list of foods and beverages to avoid. Contact your dietitian for more information.   This information is not intended to replace advice given to you by your health care provider. Make sure you discuss any questions you have with your health care provider.   Document Released: 09/18/2014 Document Reviewed: 09/18/2014 Elsevier Interactive Patient Education 2016 [employer]  

## 2016-03-19 NOTE — Progress Notes (Signed)
Patient: Krista Cooper Female    DOB: Apr 13, 1956   60 y.o.   MRN: [id number] Visit Date: 03/19/2016  Today's Provider: Margaretann LovelessJennifer M Terrion Gencarelli, PA-C   Chief Complaint  Patient presents with  . Hyperglycemia  . Hypertension  . Hyperlipidemia   Subjective:    HPI  Prediabetes, Follow-up:   Lab Results  Component Value Date   HGBA1C 6.1 03/19/2016   HGBA1C 6.4 (H) 09/25/2015   HGBA1C 5.9 02/15/2015   GLUCOSE 107 (H) 09/25/2015    Last seen for for this 6 months ago.  Management since that visit includes no changes. Current symptoms include none and have been stable.  Weight trend: stable Prior visit with dietician: no Current diet: in general, a "healthy" diet   Current exercise: bicycling and walking  Pertinent Labs:    Component Value Date/Time   CHOL 183 09/25/2015 0826   TRIG 54 09/25/2015 0826   CREATININE 0.65 09/25/2015 0826    Wt Readings from Last 3 Encounters:  03/19/16 193 lb (87.5 kg)  09/16/15 196 lb (88.9 kg)  08/05/15 196 lb (88.9 kg)    Hypertension, follow-up:  BP Readings from Last 3 Encounters:  03/19/16 126/70  09/16/15 138/88  08/05/15 136/72    She was last seen for hypertension 6 months ago.  BP at that visit was 138/88. Management changes since that visit include no changes. She reports excellent compliance with treatment. She is not having side effects.  She is exercising. She is adherent to low salt diet.   Outside blood pressures are stable. She is experiencing none.  Patient denies chest pain and lower extremity edema.   Cardiovascular risk factors include none.  Use of agents associated with hypertension: none.     Weight trend: stable Wt Readings from Last 3 Encounters:  03/19/16 193 lb (87.5 kg)  09/16/15 196 lb (88.9 kg)  08/05/15 196 lb (88.9 kg)    Current diet: in general, a "healthy" diet    ------------------------------------------------------------------------   Lipid/Cholesterol, Follow-up:     Last seen for this6 months ago.  Management changes since that visit include no changes. . Last Lipid Panel:    Component Value Date/Time   CHOL 183 09/25/2015 0826   TRIG 54 09/25/2015 0826   HDL 75 09/25/2015 0826   LDLCALC 97 09/25/2015 0826    Risk factors for vascular disease include hypercholesterolemia and hypertension  She reports excellent compliance with treatment. She is not having side effects.  Current symptoms include none and have been stable. Weight trend: stable Prior visit with dietician: no Current diet: in general, a "healthy" diet   Current exercise: walking  Wt Readings from Last 3 Encounters:  03/19/16 193 lb (87.5 kg)  09/16/15 196 lb (88.9 kg)  08/05/15 196 lb (88.9 kg)    -------------------------------------------------------------------     Allergies  Allergen Reactions  . Codeine     Itching; Can take Cough Syrup  . Lisinopril Cough     Current Outpatient Prescriptions:  .  aspirin 81 MG tablet, Take 1 tablet by mouth daily., Disp: , Rfl:  .  B Complex Vitamins (VITAMIN B COMPLEX PO), Take 1 tablet by mouth daily., Disp: , Rfl:  .  Cholecalciferol 10000 UNITS CAPS, Take 2 capsules by mouth daily. , Disp: , Rfl:  .  fluticasone (FLONASE) 50 MCG/ACT nasal spray, Place 2 sprays into both nostrils daily., Disp: 48 g, Rfl: 1 .  loratadine (CLARITIN) 10 MG tablet, Take 1 tablet by  mouth every other day. , Disp: , Rfl:  .  MULTIPLE VITAMIN PO, Take 1 tablet by mouth daily., Disp: , Rfl:  .  omega-3 fish oil (MAXEPA) 1000 MG CAPS capsule, Take 1 capsule by mouth daily., Disp: , Rfl:  .  simvastatin (ZOCOR) 20 MG tablet, Take 1 tablet (20 mg total) by mouth at bedtime., Disp: 90 tablet, Rfl: 3 .  triamterene-hydrochlorothiazide (MAXZIDE-25) 37.5-25 MG tablet, Take 1 tablet by mouth daily., Disp: 90 tablet, Rfl: 3  Review of Systems  Constitutional: Negative.   Respiratory: Negative.   Cardiovascular: Negative.   Gastrointestinal:  Negative.   Endocrine: Negative.   Neurological: Negative.     Social History  Substance Use Topics  . Smoking status: Never Smoker  . Smokeless tobacco: Never Used  . Alcohol use Yes     Comment: rare glass of wine   Objective:   BP 126/70 (BP Location: Right Arm, Patient Position: Sitting, Cuff Size: Large)   Pulse 80   Temp 98.4 F (36.9 C) (Oral)   Resp 16   Wt 193 lb (87.5 kg)   BMI 31.63 kg/m   Physical Exam  Constitutional: She appears well-developed and well-nourished. No distress.  Neck: Normal range of motion. Neck supple.  Cardiovascular: Normal rate, regular rhythm and normal heart sounds.  Exam reveals no gallop and no friction rub.   No murmur heard. Pulmonary/Chest: Effort normal and breath sounds normal. No respiratory distress. She has no wheezes. She has no rales.  Musculoskeletal: She exhibits no edema.  Skin: She is not diaphoretic.  Vitals reviewed.     Assessment & Plan:     1. Borderline diabetes A1c improved back to 6.1. Continue lifestyle modifications.  - POCT glycosylated hemoglobin (Hb A1C)  2. Essential hypertension Stable. Continue Maxzide.  3. Hypercholesteremia Stable.   4. Need for TD vaccine Tdap Vaccine given to patient without complications. Patient sat for 15 minutes after administration and was tolerated well without adverse effects. - Td : Tetanus/diphtheria >7yo Preservative  free  5. Need for influenza vaccination Flu vaccine given today without complication. Patient sat upright for 15 minutes to check for adverse reaction before being released. - Flu Vaccine QUAD 36+ mos PF IM (Fluarix & Fluzone Quad PF)       Margaretann LovelessJennifer M Eliz Nigg, PA-C  [facility]

## 2016-08-11 ENCOUNTER — Other Ambulatory Visit: Payer: Self-pay | Admitting: Physician Assistant

## 2016-08-11 DIAGNOSIS — Z1231 Encounter for screening mammogram for malignant neoplasm of breast: Secondary | ICD-10-CM

## 2016-09-23 ENCOUNTER — Encounter: Payer: Self-pay | Admitting: Physician Assistant

## 2016-09-23 ENCOUNTER — Telehealth: Payer: Self-pay | Admitting: Physician Assistant

## 2016-09-23 ENCOUNTER — Ambulatory Visit (INDEPENDENT_AMBULATORY_CARE_PROVIDER_SITE_OTHER): Payer: Federal, State, Local not specified - PPO | Admitting: Physician Assistant

## 2016-09-23 VITALS — BP 130/80 | HR 77 | Temp 98.6°F | Resp 16 | Ht 66.0 in | Wt 194.4 lb

## 2016-09-23 DIAGNOSIS — Z1231 Encounter for screening mammogram for malignant neoplasm of breast: Secondary | ICD-10-CM | POA: Diagnosis not present

## 2016-09-23 DIAGNOSIS — Z1211 Encounter for screening for malignant neoplasm of colon: Secondary | ICD-10-CM

## 2016-09-23 DIAGNOSIS — J302 Other seasonal allergic rhinitis: Secondary | ICD-10-CM

## 2016-09-23 DIAGNOSIS — Z124 Encounter for screening for malignant neoplasm of cervix: Secondary | ICD-10-CM | POA: Diagnosis not present

## 2016-09-23 DIAGNOSIS — Z1239 Encounter for other screening for malignant neoplasm of breast: Secondary | ICD-10-CM

## 2016-09-23 DIAGNOSIS — R7303 Prediabetes: Secondary | ICD-10-CM | POA: Diagnosis not present

## 2016-09-23 DIAGNOSIS — Z0000 Encounter for general adult medical examination without abnormal findings: Secondary | ICD-10-CM | POA: Diagnosis not present

## 2016-09-23 DIAGNOSIS — E785 Hyperlipidemia, unspecified: Secondary | ICD-10-CM

## 2016-09-23 DIAGNOSIS — Z1329 Encounter for screening for other suspected endocrine disorder: Secondary | ICD-10-CM | POA: Diagnosis not present

## 2016-09-23 DIAGNOSIS — I10 Essential (primary) hypertension: Secondary | ICD-10-CM

## 2016-09-23 MED ORDER — FLUTICASONE PROPIONATE 50 MCG/ACT NA SUSP: 2.0000 | Freq: Every day | NASAL | 1 refills | Status: DC

## 2016-09-23 MED ORDER — TRIAMTERENE-HCTZ 37.5-25 MG PO TABS: 1.0000 | ORAL_TABLET | Freq: Every day | ORAL | 3 refills | Status: DC

## 2016-09-23 MED ORDER — SIMVASTATIN 20 MG PO TABS: 20.0000 mg | ORAL_TABLET | Freq: Every day | ORAL | 3 refills | Status: DC

## 2016-09-23 NOTE — Patient Instructions (Signed)
Claritin-D x 7 days   Health Maintenance for Postmenopausal Women Menopause is a normal process in which your reproductive ability comes to an end. This process happens gradually over a span of months to years, usually between the ages of 42 and 100. Menopause is complete when you have missed 12 consecutive menstrual periods. It is important to talk with your health care provider about some of the most common conditions that affect postmenopausal women, such as heart disease, cancer, and bone loss (osteoporosis). Adopting a healthy lifestyle and getting preventive care can help to promote your health and wellness. Those actions can also lower your chances of developing some of these common conditions. What should I know about menopause? During menopause, you may experience a number of symptoms, such as:  Moderate-to-severe hot flashes.  Night sweats.  Decrease in sex drive.  Mood swings.  Headaches.  Tiredness.  Irritability.  Memory problems.  Insomnia. Choosing to treat or not to treat menopausal changes is an individual decision that you make with your health care provider. What should I know about hormone replacement therapy and supplements? Hormone therapy products are effective for treating symptoms that are associated with menopause, such as hot flashes and night sweats. Hormone replacement carries certain risks, especially as you become older. If you are thinking about using estrogen or estrogen with progestin treatments, discuss the benefits and risks with your health care provider. What should I know about heart disease and stroke? Heart disease, heart attack, and stroke become more likely as you age. This may be due, in part, to the hormonal changes that your body experiences during menopause. These can affect how your body processes dietary fats, triglycerides, and cholesterol. Heart attack and stroke are both medical emergencies. There are many things that you can do to  help prevent heart disease and stroke:  Have your blood pressure checked at least every 1-2 years. High blood pressure causes heart disease and increases the risk of stroke.  If you are 44-40 years old, ask your health care provider if you should take aspirin to prevent a heart attack or a stroke.  Do not use any tobacco products, including cigarettes, chewing tobacco, or electronic cigarettes. If you need help quitting, ask your health care provider.  It is important to eat a healthy diet and maintain a healthy weight.  Be sure to include plenty of vegetables, fruits, low-fat dairy products, and lean protein.  Avoid eating foods that are high in solid fats, added sugars, or salt (sodium).  Get regular exercise. This is one of the most important things that you can do for your health.  Try to exercise for at least 150 minutes each week. The type of exercise that you do should increase your heart rate and make you sweat. This is known as moderate-intensity exercise.  Try to do strengthening exercises at least twice each week. Do these in addition to the moderate-intensity exercise.  Know your numbers.Ask your health care provider to check your cholesterol and your blood glucose. Continue to have your blood tested as directed by your health care provider. What should I know about cancer screening? There are several types of cancer. Take the following steps to reduce your risk and to catch any cancer development as early as possible. Breast Cancer  Practice breast self-awareness.  This means understanding how your breasts normally appear and feel.  It also means doing regular breast self-exams. Let your health care provider know about any changes, no matter how small.  If you are 23 or older, have a clinician do a breast exam (clinical breast exam or CBE) every year. Depending on your age, family history, and medical history, it may be recommended that you also have a yearly breast X-ray  (mammogram).  If you have a family history of breast cancer, talk with your health care provider about genetic screening.  If you are at high risk for breast cancer, talk with your health care provider about having an MRI and a mammogram every year.  Breast cancer (BRCA) gene test is recommended for women who have family members with BRCA-related cancers. Results of the assessment will determine the need for genetic counseling and BRCA1 and for BRCA2 testing. BRCA-related cancers include these types:  Breast. This occurs in males or females.  Ovarian.  Tubal. This may also be called fallopian tube cancer.  Cancer of the abdominal or pelvic lining (peritoneal cancer).  Prostate.  Pancreatic. Cervical, Uterine, and Ovarian Cancer  Your health care provider may recommend that you be screened regularly for cancer of the pelvic organs. These include your ovaries, uterus, and vagina. This screening involves a pelvic exam, which includes checking for microscopic changes to the surface of your cervix (Pap test).  For women ages 21-65, health care providers may recommend a pelvic exam and a Pap test every three years. For women ages 18-65, they may recommend the Pap test and pelvic exam, combined with testing for human papilloma virus (HPV), every five years. Some types of HPV increase your risk of cervical cancer. Testing for HPV may also be done on women of any age who have unclear Pap test results.  Other health care providers may not recommend any screening for nonpregnant women who are considered low risk for pelvic cancer and have no symptoms. Ask your health care provider if a screening pelvic exam is right for you.  If you have had past treatment for cervical cancer or a condition that could lead to cancer, you need Pap tests and screening for cancer for at least 20 years after your treatment. If Pap tests have been discontinued for you, your risk factors (such as having a new sexual  partner) need to be reassessed to determine if you should start having screenings again. Some women have medical problems that increase the chance of getting cervical cancer. In these cases, your health care provider may recommend that you have screening and Pap tests more often.  If you have a family history of uterine cancer or ovarian cancer, talk with your health care provider about genetic screening.  If you have vaginal bleeding after reaching menopause, tell your health care provider.  There are currently no reliable tests available to screen for ovarian cancer. Lung Cancer  Lung cancer screening is recommended for adults 77-68 years old who are at high risk for lung cancer because of a history of smoking. A yearly low-dose CT scan of the lungs is recommended if you:  Currently smoke.  Have a history of at least 30 pack-years of smoking and you currently smoke or have quit within the past 15 years. A pack-year is smoking an average of one pack of cigarettes per day for one year. Yearly screening should:  Continue until it has been 15 years since you quit.  Stop if you develop a health problem that would prevent you from having lung cancer treatment. Colorectal Cancer  This type of cancer can be detected and can often be prevented.  Routine colorectal cancer screening usually  begins at age 63 and continues through age 53.  If you have risk factors for colon cancer, your health care provider may recommend that you be screened at an earlier age.  If you have a family history of colorectal cancer, talk with your health care provider about genetic screening.  Your health care provider may also recommend using home test kits to check for hidden blood in your stool.  A small camera at the end of a tube can be used to examine your colon directly (sigmoidoscopy or colonoscopy). This is done to check for the earliest forms of colorectal cancer.  Direct examination of the colon should be  repeated every 5-10 years until age 80. However, if early forms of precancerous polyps or small growths are found or if you have a family history or genetic risk for colorectal cancer, you may need to be screened more often. Skin Cancer  Check your skin from head to toe regularly.  Monitor any moles. Be sure to tell your health care provider:  About any new moles or changes in moles, especially if there is a change in a mole's shape or color.  If you have a mole that is larger than the size of a pencil eraser.  If any of your family members has a history of skin cancer, especially at a young age, talk with your health care provider about genetic screening.  Always use sunscreen. Apply sunscreen liberally and repeatedly throughout the day.  Whenever you are outside, protect yourself by wearing long sleeves, pants, a wide-brimmed hat, and sunglasses. What should I know about osteoporosis? Osteoporosis is a condition in which bone destruction happens more quickly than new bone creation. After menopause, you may be at an increased risk for osteoporosis. To help prevent osteoporosis or the bone fractures that can happen because of osteoporosis, the following is recommended:  If you are 29-86 years old, get at least 1,000 mg of calcium and at least 600 mg of vitamin D per day.  If you are older than age 76 but younger than age 63, get at least 1,200 mg of calcium and at least 600 mg of vitamin D per day.  If you are older than age 76, get at least 1,200 mg of calcium and at least 800 mg of vitamin D per day. Smoking and excessive alcohol intake increase the risk of osteoporosis. Eat foods that are rich in calcium and vitamin D, and do weight-bearing exercises several times each week as directed by your health care provider. What should I know about how menopause affects my mental health? Depression may occur at any age, but it is more common as you become older. Common symptoms of depression  include:  Low or sad mood.  Changes in sleep patterns.  Changes in appetite or eating patterns.  Feeling an overall lack of motivation or enjoyment of activities that you previously enjoyed.  Frequent crying spells. Talk with your health care provider if you think that you are experiencing depression. What should I know about immunizations? It is important that you get and maintain your immunizations. These include:  Tetanus, diphtheria, and pertussis (Tdap) booster vaccine.  Influenza every year before the flu season begins.  Pneumonia vaccine.  Shingles vaccine. Your health care provider may also recommend other immunizations. This information is not intended to replace advice given to you by your health care provider. Make sure you discuss any questions you have with your health care provider. Document Released: 06/26/2005 Document Revised: 11/22/2015  Document Reviewed: 02/05/2015 Elsevier Interactive Patient Education  2017 [employer].

## 2016-09-23 NOTE — Progress Notes (Signed)
Patient: Krista Cooper, Female    DOB: 11-24-1955, 61 y.o.   MRN: [id number] Visit Date: 09/23/2016  Today's Provider: Margaretann LovelessJennifer M Jakaleb Payer, PA-C   Chief Complaint  Patient presents with  . Annual Exam   Subjective:    Annual physical exam Krista Cooper is a 61 y.o. female who presents today for health maintenance and complete physical. She feels well. She reports exercising. She reports she is sleeping well.  Last CPE:09/16/15 Last Pap:04/22/12-Neg,-HPV-neg Last Mammogram:01/02/16-BI-RADS 2. Has mammogram scheduled for 12/16/2016 Last Colonosopy:05/09/2007-WNL  -----------------------------------------------------------------   Review of Systems  Constitutional: Negative.   HENT: Positive for rhinorrhea.        Ear popping  Eyes: Negative.   Respiratory: Negative.   Cardiovascular: Negative.   Gastrointestinal: Negative.   Endocrine: Negative.   Genitourinary: Negative.   Musculoskeletal: Negative.   Skin: Negative.   Allergic/Immunologic: Positive for environmental allergies.  Neurological: Positive for dizziness.  Hematological: Negative.   Psychiatric/Behavioral: Negative.   All positive ROS are related to seasonal allergies  Social History      She  reports that she has never smoked. She has never used smokeless tobacco. She reports that she drinks alcohol. She reports that she does not use drugs.       Social History   Social History  . Marital status: Married    Spouse name: N/A  . Number of children: N/A  . Years of education: N/A   Social History Main Topics  . Smoking status: Never Smoker  . Smokeless tobacco: Never Used  . Alcohol use Yes     Comment: rare glass of wine  . Drug use: No  . Sexual activity: Not Asked   Other Topics Concern  . None   Social History Narrative  . None    Past Medical History:  Diagnosis Date  . Allergy   . Anxiety   . Hyperlipidemia   . Hypertension      Patient Active Problem List   Diagnosis Date Noted  . 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 Allergic rhinitis 02/14/2015  . Anxiety 02/14/2015  . Hypercholesteremia 02/14/2015  . BP (high blood pressure) 02/14/2015  . Cannot sleep 02/14/2015  . Borderline diabetes 02/14/2015  . Avitaminosis D 02/14/2015  . Hyperlipemia 12/25/2014    Past Surgical History:  Procedure Laterality Date  . CESAREAN SECTION    . FRACTURE SURGERY Right 1997   PLATES AND PINS  . MOUTH SURGERY  1976   WISDOM TEETH REMOVED    Family History        Family Status  Relation Status  . Mother Deceased  . Father Deceased at age 61   LUNG CANCER  . Paternal Grandfather Deceased   MI  . Daughter Deceased   MVA  . Maternal Grandmother         Her family history includes COPD in her mother; CVA in her maternal grandmother; Cancer in her father; Congestive Heart Failure in her father; Diabetes in her mother and paternal grandfather; Hyperlipidemia in her mother; Hypertension in her father and mother.     Allergies  Allergen Reactions  . Codeine     Itching; Can take Cough Syrup  . Lisinopril Cough     Current Outpatient Prescriptions:  .  aspirin 81 MG tablet, Take 1 tablet by mouth daily., Disp: , Rfl:  .  B Complex Vitamins (VITAMIN B COMPLEX PO), Take 1 tablet by mouth daily., Disp: , Rfl:  .  Cholecalciferol  10000 UNITS CAPS, Take 2 capsules by mouth daily. , Disp: , Rfl:  .  fluticasone (FLONASE) 50 MCG/ACT nasal spray, Place 2 sprays into both nostrils daily., Disp: 48 g, Rfl: 1 .  loratadine (CLARITIN) 10 MG tablet, Take 1 tablet by mouth every other day. , Disp: , Rfl:  .  MULTIPLE VITAMIN PO, Take 1 tablet by mouth daily., Disp: , Rfl:  .  omega-3 fish oil (MAXEPA) 1000 MG CAPS capsule, Take 1 capsule by mouth daily., Disp: , Rfl:  .  simvastatin (ZOCOR) 20 MG tablet, Take 1 tablet (20 mg total) by mouth at bedtime., Disp: 90 tablet, Rfl: 3 .  triamterene-hydrochlorothiazide (MAXZIDE-25) 37.5-25 MG tablet, Take 1 tablet by  mouth daily., Disp: 90 tablet, Rfl: 3   Patient Care Team: Margaretann Loveless, PA-C as PCP - General (Family Medicine)      Objective:   Vitals: BP 130/80 (BP Location: Right Arm, Patient Position: Sitting, Cuff Size: Normal)   Pulse 77   Temp 98.6 F (37 C) (Oral)   Resp 16   Ht 5\' 6"  (1.676 m)   Wt 194 lb 6.4 oz (88.2 kg)   BMI 31.38 kg/m     Physical Exam  Constitutional: She is oriented to person, place, and time. She appears well-developed and well-nourished. No distress.  HENT:  Head: Normocephalic and atraumatic.  Right Ear: Hearing, external ear and ear canal normal. Tympanic membrane is not perforated, not erythematous, not retracted and not bulging. A middle ear effusion is present.  Left Ear: Hearing, external ear and ear canal normal. Tympanic membrane is not perforated, not erythematous, not retracted and not bulging. A middle ear effusion is present.  Nose: Mucosal edema present. No rhinorrhea. Right sinus exhibits no maxillary sinus tenderness and no frontal sinus tenderness. Left sinus exhibits no maxillary sinus tenderness and no frontal sinus tenderness.  Mouth/Throat: Uvula is midline, oropharynx is clear and moist and mucous membranes are normal. No oropharyngeal exudate, posterior oropharyngeal edema or posterior oropharyngeal erythema.  Eyes: Conjunctivae and EOM are normal. Pupils are equal, round, and reactive to light. Right eye exhibits no discharge. Left eye exhibits no discharge. No scleral icterus.  Neck: Normal range of motion. Neck supple. No JVD present. Carotid bruit is not present. No tracheal deviation present. No thyromegaly present.  Cardiovascular: Normal rate, regular rhythm, normal heart sounds and intact distal pulses.  Exam reveals no gallop and no friction rub.   No murmur heard. Pulmonary/Chest: Effort normal and breath sounds normal. No respiratory distress. She has no wheezes. She has no rales. She exhibits no tenderness. Right breast  exhibits no inverted nipple, no mass, no nipple discharge, no skin change and no tenderness. Left breast exhibits no inverted nipple, no mass, no nipple discharge, no skin change and no tenderness. Breasts are symmetrical.  Abdominal: Soft. Bowel sounds are normal. She exhibits no distension and no mass. There is no tenderness. There is no rebound and no guarding. Hernia confirmed negative in the right inguinal area and confirmed negative in the left inguinal area.  Genitourinary: Rectum normal, vagina normal and uterus normal. No breast swelling, tenderness, discharge or bleeding. Pelvic exam was performed with patient supine. There is no rash, tenderness, lesion or injury on the right labia. There is no rash, tenderness, lesion or injury on the left labia. Cervix exhibits no motion tenderness, no discharge and no friability. Right adnexum displays no mass, no tenderness and no fullness. Left adnexum displays no mass, no tenderness and no  fullness. No erythema, tenderness or bleeding in the vagina. No signs of injury around the vagina. No vaginal discharge found.  Musculoskeletal: Normal range of motion. She exhibits no edema or tenderness.  Lymphadenopathy:    She has no cervical adenopathy.       Right: No inguinal adenopathy present.       Left: No inguinal adenopathy present.  Neurological: She is alert and oriented to person, place, and time. She has normal reflexes. No cranial nerve deficit. Coordination normal.  Skin: Skin is warm and dry. No rash noted. She is not diaphoretic.  Psychiatric: She has a normal mood and affect. Her behavior is normal. Judgment and thought content normal.  Vitals reviewed.    Depression Screen PHQ 2/9 Scores 09/23/2016 09/16/2015  PHQ - 2 Score 0 0  PHQ- 9 Score 0 -      Assessment & Plan:     Routine Health Maintenance and Physical Exam  Exercise Activities and Dietary recommendations Goals    None      Immunization History  Administered Date(s)  Administered  . Influenza,inj,Quad PF,36+ Mos 02/15/2015, 03/19/2016  . Td 03/19/2016  . Tdap 10/15/2005    Health Maintenance  Topic Date Due  . PAP SMEAR  04/23/2015  . INFLUENZA VACCINE  12/16/2016  . COLONOSCOPY  05/08/2017  . MAMMOGRAM  12/25/2017  . TETANUS/TDAP  03/19/2026  . Hepatitis C Screening  Completed  . HIV Screening  Completed     Discussed health benefits of physical activity, and encouraged her to engage in regular exercise appropriate for her age and condition.    1. Annual physical exam Normal physical exam today. Will check labs as below and f/u pending lab results. If labs are stable and WNL she will not need to have these rechecked for one year at her next annual physical exam. She is to call the office in the meantime if she has any acute issue, questions or concerns.  2. Screening for breast cancer Mammogram scheduled for 12/2016 already. Breast exam today normal. Patient does perform self breast exams regularly.  3. Screening for colon cancer Colonoscopy in 2008 normal. No family history of colon cancer. Prefers cologuard currently but is willing to get colonoscopy if cologuard positive. - Cologuard  4. Screening for cervical cancer Pap collected today. Will send as below and f/u pending results. - Pap IG and HPV (high risk) DNA detection  5. Screening for thyroid disorder Will check labs as below and f/u pending results. - TSH  6. Essential hypertension Stable. Continue triamterene-hctz 37.5-25mg . Will check labs as below and f/u pending results. - CBC with Differential/Platelet - Comprehensive metabolic panel - triamterene-hydrochlorothiazide (MAXZIDE-25) 37.5-25 MG tablet; Take 1 tablet by mouth daily.  Dispense: 90 tablet; Refill: 3  7. Borderline diabetes Will check labs as below and f/u pending results. - Hemoglobin A1c  8. Hyperlipidemia, unspecified hyperlipidemia type Stable. Continue Simvastatin 20mg . Will check labs as below and f/u  pending results. - CBC with Differential/Platelet - Comprehensive metabolic panel - Lipid panel - simvastatin (ZOCOR) 20 MG tablet; Take 1 tablet (20 mg total) by mouth at bedtime.  Dispense: 90 tablet; Refill: 3  9. Other seasonal allergic rhinitis Stable. Diagnosis pulled for medication refill. Continue current medical treatment plan. - fluticasone (FLONASE) 50 MCG/ACT nasal spray; Place 2 sprays into both nostrils daily.  Dispense: 48 g; Refill: 1  --------------------------------------------------------------------    Margaretann Loveless, PA-C  [facility]

## 2016-09-23 NOTE — Telephone Encounter (Signed)
Order for cologuard faxed to [facility] °

## 2016-09-26 LAB — PAP IG AND HPV HIGH-RISK
HPV, high-risk: NEGATIVE
PAP SMEAR COMMENT: 0

## 2016-09-28 ENCOUNTER — Telehealth: Payer: Self-pay

## 2016-09-28 NOTE — Telephone Encounter (Signed)
-----   Message from Margaretann LovelessJennifer M Burnette, PA-C sent at 09/26/2016  9:00 AM EDT ----- Pap is normal, HPV negative.  Will repeat in 3-5 years.

## 2016-09-28 NOTE — Telephone Encounter (Signed)
LMTCB  Thanks,  -Nima Bamburg 

## 2016-09-29 NOTE — Telephone Encounter (Signed)
LMTCB

## 2016-09-29 NOTE — Telephone Encounter (Signed)
Patient advised as below. Patient verbalizes understanding and is in agreement with treatment plan.  

## 2016-10-15 DIAGNOSIS — Z1329 Encounter for screening for other suspected endocrine disorder: Secondary | ICD-10-CM | POA: Diagnosis not present

## 2016-10-15 DIAGNOSIS — E785 Hyperlipidemia, unspecified: Secondary | ICD-10-CM | POA: Diagnosis not present

## 2016-10-15 DIAGNOSIS — R7303 Prediabetes: Secondary | ICD-10-CM | POA: Diagnosis not present

## 2016-10-15 DIAGNOSIS — I10 Essential (primary) hypertension: Secondary | ICD-10-CM | POA: Diagnosis not present

## 2016-10-16 LAB — CBC WITH DIFFERENTIAL/PLATELET
BASOS: 0 %
Basophils Absolute: 0 10*3/uL (ref 0.0–0.2)
EOS (ABSOLUTE): 0.1 10*3/uL (ref 0.0–0.4)
EOS: 1 %
HEMATOCRIT: 42 % (ref 34.0–46.6)
Hemoglobin: 13.7 g/dL (ref 11.1–15.9)
Immature Grans (Abs): 0 10*3/uL (ref 0.0–0.1)
Immature Granulocytes: 0 %
LYMPHS ABS: 1.6 10*3/uL (ref 0.7–3.1)
Lymphs: 35 %
MCH: 28.8 pg (ref 26.6–33.0)
MCHC: 32.6 g/dL (ref 31.5–35.7)
MCV: 88 fL (ref 79–97)
MONOS ABS: 0.4 10*3/uL (ref 0.1–0.9)
Monocytes: 8 %
Neutrophils Absolute: 2.6 10*3/uL (ref 1.4–7.0)
Neutrophils: 56 %
PLATELETS: 313 10*3/uL (ref 150–379)
RBC: 4.75 x10E6/uL (ref 3.77–5.28)
RDW: 14 % (ref 12.3–15.4)
WBC: 4.6 10*3/uL (ref 3.4–10.8)

## 2016-10-16 LAB — COMPREHENSIVE METABOLIC PANEL
A/G RATIO: 2 (ref 1.2–2.2)
ALK PHOS: 62 IU/L (ref 39–117)
ALT: 25 IU/L (ref 0–32)
AST: 29 IU/L (ref 0–40)
Albumin: 4.3 g/dL (ref 3.6–4.8)
BILIRUBIN TOTAL: 0.6 mg/dL (ref 0.0–1.2)
BUN/Creatinine Ratio: 25 (ref 12–28)
BUN: 15 mg/dL (ref 8–27)
CHLORIDE: 99 mmol/L (ref 96–106)
CO2: 25 mmol/L (ref 18–29)
Calcium: 9.6 mg/dL (ref 8.7–10.3)
Creatinine, Ser: 0.59 mg/dL (ref 0.57–1.00)
GFR calc Af Amer: 115 mL/min/{1.73_m2} (ref >59)
GFR, EST NON AFRICAN AMERICAN: 100 mL/min/{1.73_m2} (ref >59)
GLOBULIN, TOTAL: 2.2 g/dL (ref 1.5–4.5)
Glucose: 113 mg/dL — ABNORMAL HIGH (ref 65–99)
POTASSIUM: 3.9 mmol/L (ref 3.5–5.2)
SODIUM: 140 mmol/L (ref 134–144)
Total Protein: 6.5 g/dL (ref 6.0–8.5)

## 2016-10-16 LAB — LIPID PANEL
CHOL/HDL RATIO: 2.7 ratio (ref 0.0–4.4)
CHOLESTEROL TOTAL: 184 mg/dL (ref 100–199)
HDL: 69 mg/dL (ref >39)
LDL Calculated: 101 mg/dL — ABNORMAL HIGH (ref 0–99)
TRIGLYCERIDES: 70 mg/dL (ref 0–149)
VLDL Cholesterol Cal: 14 mg/dL (ref 5–40)

## 2016-10-16 LAB — HEMOGLOBIN A1C
Est. average glucose Bld gHb Est-mCnc: 126 mg/dL
Hgb A1c MFr Bld: 6 % — ABNORMAL HIGH (ref 4.8–5.6)

## 2016-10-16 LAB — TSH: TSH: 2.55 u[IU]/mL (ref 0.450–4.500)

## 2016-10-22 LAB — COLOGUARD: COLOGUARD: NEGATIVE

## 2016-12-16 ENCOUNTER — Ambulatory Visit
Admission: RE | Admit: 2016-12-16 | Discharge: 2016-12-16 | Disposition: A | Discharge status: Home / Self Care | Payer: Federal, State, Local not specified - PPO | Source: Ambulatory Visit | Attending: Physician Assistant | Admitting: Physician Assistant

## 2016-12-16 DIAGNOSIS — Z1231 Encounter for screening mammogram for malignant neoplasm of breast: Secondary | ICD-10-CM

## 2016-12-17 ENCOUNTER — Telehealth: Payer: Self-pay

## 2016-12-17 NOTE — Telephone Encounter (Signed)
-----   Message from Margaretann LovelessJennifer M Burnette, PA-C sent at 12/17/2016  8:20 AM EDT ----- Normal mammogram. Repeat screening in one year.

## 2016-12-17 NOTE — Telephone Encounter (Signed)
Patient advised as directed below.  Thanks,  -Joseline 

## 2017-03-16 DIAGNOSIS — Z23 Encounter for immunization: Secondary | ICD-10-CM | POA: Diagnosis not present

## 2017-03-26 ENCOUNTER — Encounter: Payer: Self-pay | Admitting: Physician Assistant

## 2017-03-26 ENCOUNTER — Ambulatory Visit: Payer: Federal, State, Local not specified - PPO | Admitting: Physician Assistant

## 2017-03-26 VITALS — BP 140/86 | HR 90 | Temp 98.1°F | Resp 16 | Ht 66.0 in | Wt 191.8 lb

## 2017-03-26 DIAGNOSIS — R7303 Prediabetes: Secondary | ICD-10-CM

## 2017-03-26 DIAGNOSIS — R030 Elevated blood-pressure reading, without diagnosis of hypertension: Secondary | ICD-10-CM | POA: Diagnosis not present

## 2017-03-26 DIAGNOSIS — I10 Essential (primary) hypertension: Secondary | ICD-10-CM | POA: Insufficient documentation

## 2017-03-26 LAB — POCT GLYCOSYLATED HEMOGLOBIN (HGB A1C)
ESTIMATED AVERAGE GLUCOSE: 131
Hemoglobin A1C: 6.2

## 2017-03-26 NOTE — Progress Notes (Signed)
Patient: Krista LudwigCarolyn C Cooper Female    DOB: 1955/10/02   61 y.o.   MRN: [id number] Visit Date: 03/26/2017  Today's Provider: Margaretann LovelessJennifer M Macel Yearsley, PA-C   Chief Complaint  Patient presents with  . Hyperglycemia  . Hypertension   Subjective:    HPI  Prediabetes, Follow-up:   Lab Results  Component Value Date   HGBA1C 6.0 (H) 10/15/2016   HGBA1C 6.1 03/19/2016   HGBA1C 6.4 (H) 09/25/2015   GLUCOSE 113 (H) 10/15/2016   GLUCOSE 107 (H) 09/25/2015    Last seen for for this 6 months ago.  Management since that visit includes no changes. Current symptoms include none and have been stable.  Weight trend: stable Prior visit with dietician: no Current diet: in general, a "healthy" diet   Current exercise: walking  Pertinent Labs:    Component Value Date/Time   CHOL 184 10/15/2016 0919   TRIG 70 10/15/2016 0919   CHOLHDL 2.7 10/15/2016 0919   CREATININE 0.59 10/15/2016 0919    Wt Readings from Last 3 Encounters:  03/26/17 191 lb 12.8 oz (87 kg)  09/23/16 194 lb 6.4 oz (88.2 kg)  03/19/16 193 lb (87.5 kg)     Hypertension, follow-up:  BP Readings from Last 3 Encounters:  03/26/17 140/86  09/23/16 130/80  03/19/16 126/70    She was last seen for hypertension 6 months ago.  BP at that visit was 130/80. Management changes since that visit include no changes. She reports excellent compliance with treatment. She is not having side effects.  She is exercising. She is adherent to low salt diet.   Outside blood pressures are stable. She is experiencing none.  Patient denies chest pain.   Cardiovascular risk factors include hypertension and obesity (BMI >= 30 kg/m2).  Use of agents associated with hypertension: none.     Weight trend: stable Wt Readings from Last 3 Encounters:  03/26/17 191 lb 12.8 oz (87 kg)  09/23/16 194 lb 6.4 oz (88.2 kg)  03/19/16 193 lb (87.5 kg)    Current diet: in general, a "healthy" diet     ------------------------------------------------------------------------       Allergies  Allergen Reactions  . Codeine     Itching; Can take Cough Syrup  . Lisinopril Cough     Current Outpatient Medications:  .  aspirin 81 MG tablet, Take 1 tablet by mouth daily., Disp: , Rfl:  .  B Complex Vitamins (VITAMIN B COMPLEX PO), Take 1 tablet by mouth daily., Disp: , Rfl:  .  Cholecalciferol 10000 UNITS CAPS, Take 2 capsules by mouth daily. , Disp: , Rfl:  .  fluticasone (FLONASE) 50 MCG/ACT nasal spray, Place 2 sprays into both nostrils daily., Disp: 48 g, Rfl: 1 .  loratadine (CLARITIN) 10 MG tablet, Take 1 tablet by mouth every other day. , Disp: , Rfl:  .  MULTIPLE VITAMIN PO, Take 1 tablet by mouth daily., Disp: , Rfl:  .  omega-3 fish oil (MAXEPA) 1000 MG CAPS capsule, Take 1 capsule by mouth daily., Disp: , Rfl:  .  simvastatin (ZOCOR) 20 MG tablet, Take 1 tablet (20 mg total) by mouth at bedtime., Disp: 90 tablet, Rfl: 3 .  triamterene-hydrochlorothiazide (MAXZIDE-25) 37.5-25 MG tablet, Take 1 tablet by mouth daily., Disp: 90 tablet, Rfl: 3  Review of Systems  Constitutional: Negative.   HENT: Negative.   Respiratory: Negative.   Cardiovascular: Negative.   Gastrointestinal: Negative.   Endocrine: Negative.   Allergic/Immunologic: Positive for environmental allergies.  Neurological: Negative.     Social History   Tobacco Use  . Smoking status: Never Smoker  . Smokeless tobacco: Never Used  Substance Use Topics  . Alcohol use: Yes    Comment: rare glass of wine   Objective:   BP 140/86 (BP Location: Left Arm, Patient Position: Sitting, Cuff Size: Large)   Pulse 90   Temp 98.1 F (36.7 C) (Oral)   Resp 16   Ht 5\' 6"  (1.676 m)   Wt 191 lb 12.8 oz (87 kg)   SpO2 99%   BMI 30.96 kg/m  Vitals:   03/26/17 0814  BP: 140/86  Pulse: 90  Resp: 16  Temp: 98.1 F (36.7 C)  TempSrc: Oral  SpO2: 99%  Weight: 191 lb 12.8 oz (87 kg)  Height: 5\' 6"  (1.676 m)      Physical Exam  Constitutional: She appears well-developed and well-nourished. No distress.  Neck: Normal range of motion. Neck supple. No JVD present. No tracheal deviation present. No thyromegaly present.  Cardiovascular: Normal rate, regular rhythm and normal heart sounds. Exam reveals no gallop and no friction rub.  No murmur heard. Pulmonary/Chest: Effort normal and breath sounds normal. No respiratory distress. She has no wheezes. She has no rales.  Musculoskeletal: She exhibits no edema.  Lymphadenopathy:    She has no cervical adenopathy.  Skin: She is not diaphoretic.  Vitals reviewed.      Assessment & Plan:     1. Borderline diabetes A1c up slightly to 6.2 from 6.0. Patient reports not exercising as much in the summer due to heat and humidity, but has recently started back daily walking. Continue lifestyle modifications. I will see her back in 6 months for CPE. - POCT glycosylated hemoglobin (Hb A1C)  2. White coat syndrome without diagnosis of hypertension BP up in office today but home readings range from 110-120s/70s. No symptoms.       Margaretann LovelessJennifer M Astha Probasco, PA-C  [facility]

## 2017-03-26 NOTE — Patient Instructions (Signed)

## 2017-04-21 DIAGNOSIS — K080 Exfoliation of teeth due to systemic causes: Secondary | ICD-10-CM | POA: Diagnosis not present

## 2017-05-05 IMAGING — MG DIGITAL SCREENING BILATERAL MAMMOGRAM WITH CAD
4 series · 4 of 4 positions shown · non-contrast
Comparison: Previous exam(s).

CLINICAL DATA: Screening.

EXAM:
DIGITAL SCREENING BILATERAL MAMMOGRAM WITH CAD

[L MLO]
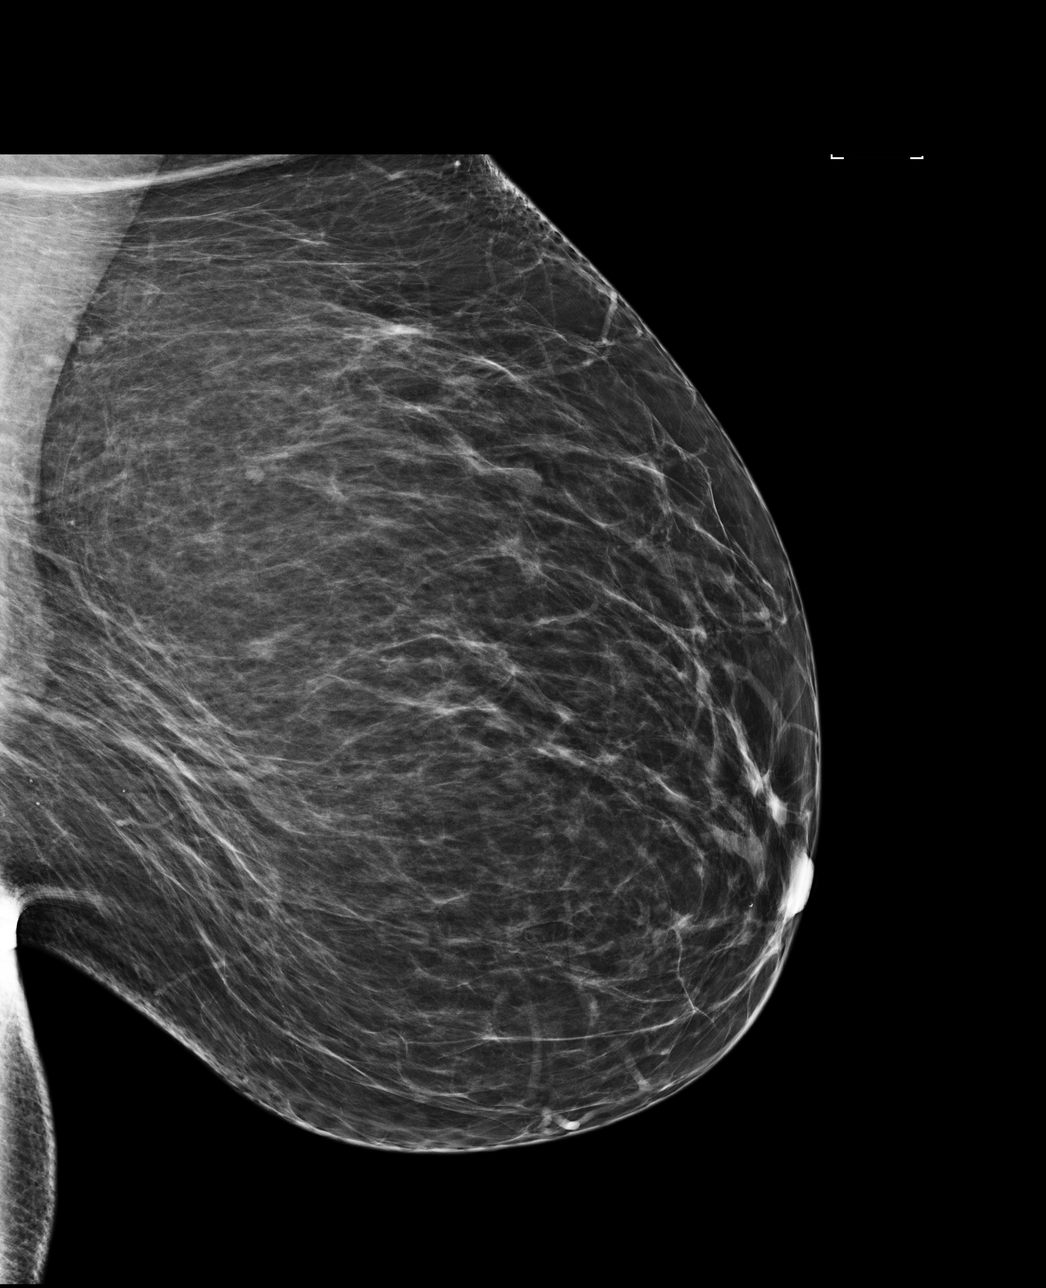

[R CC]
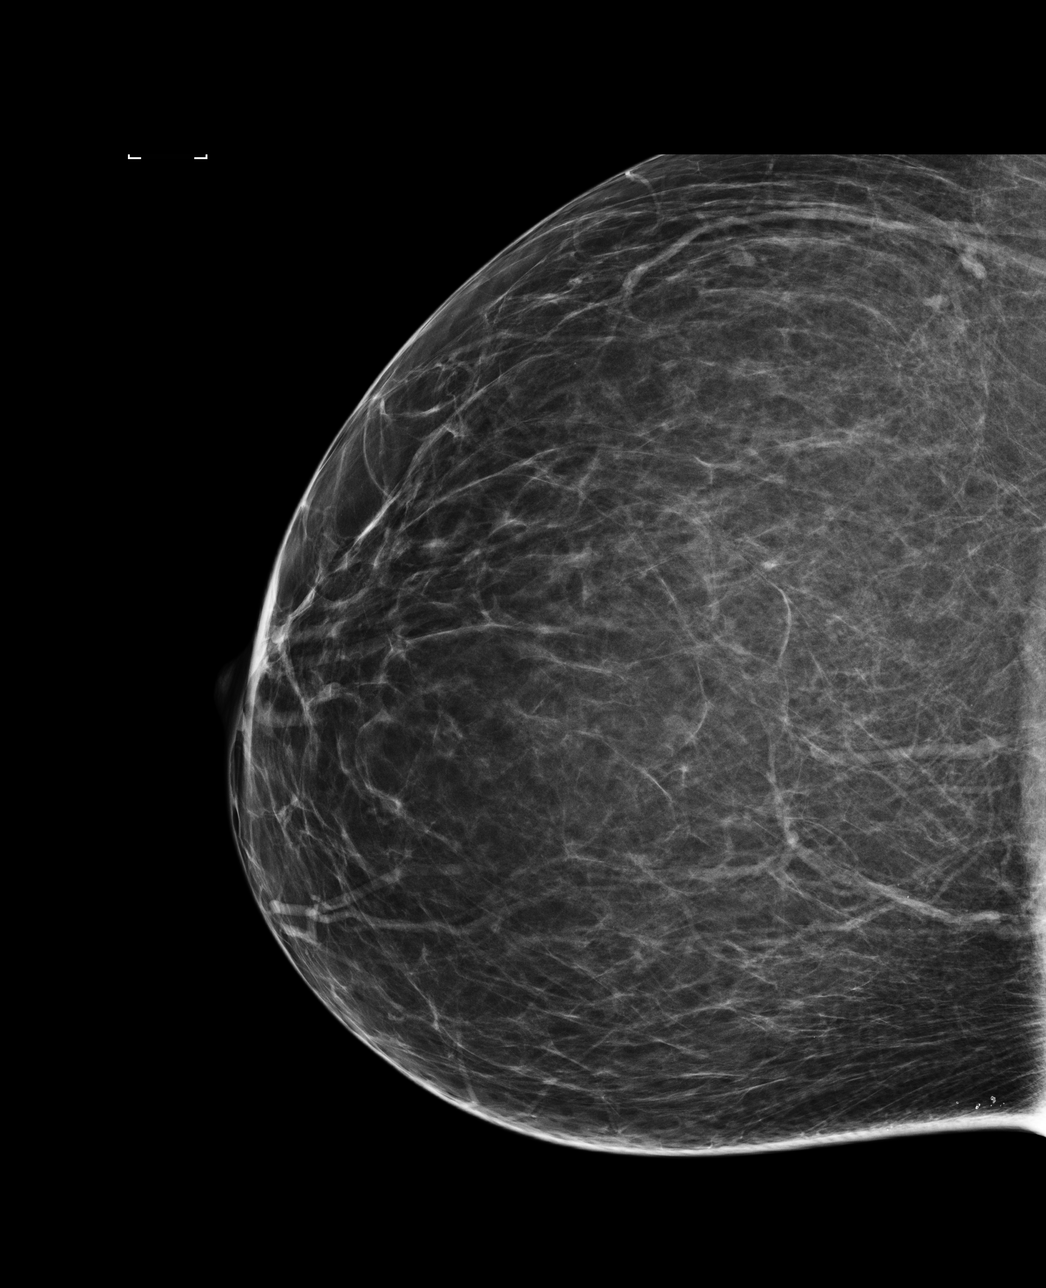

[L CC]
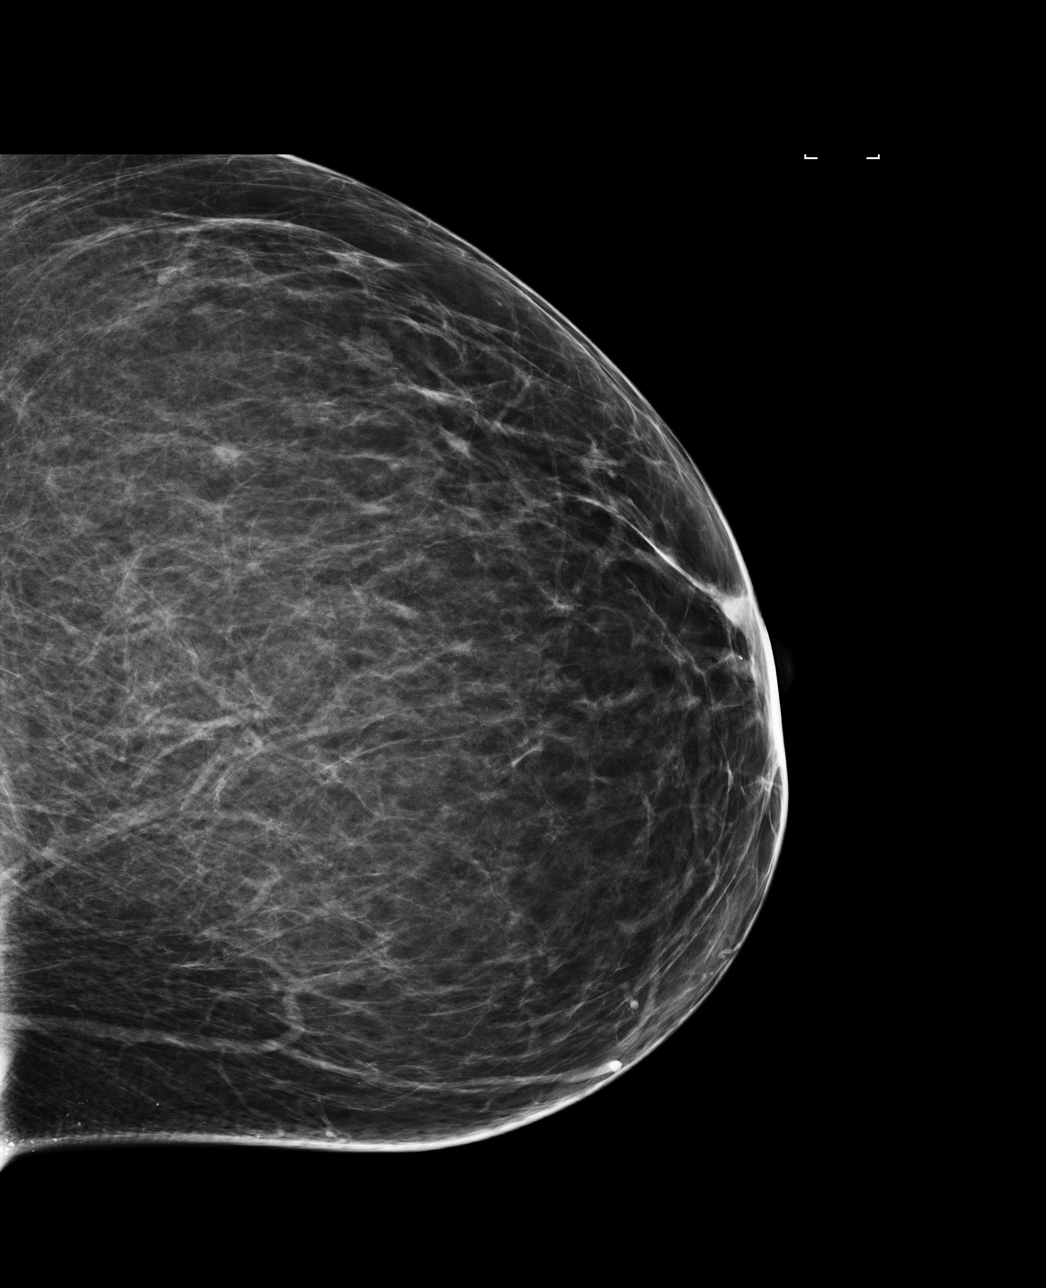

[R MLO]
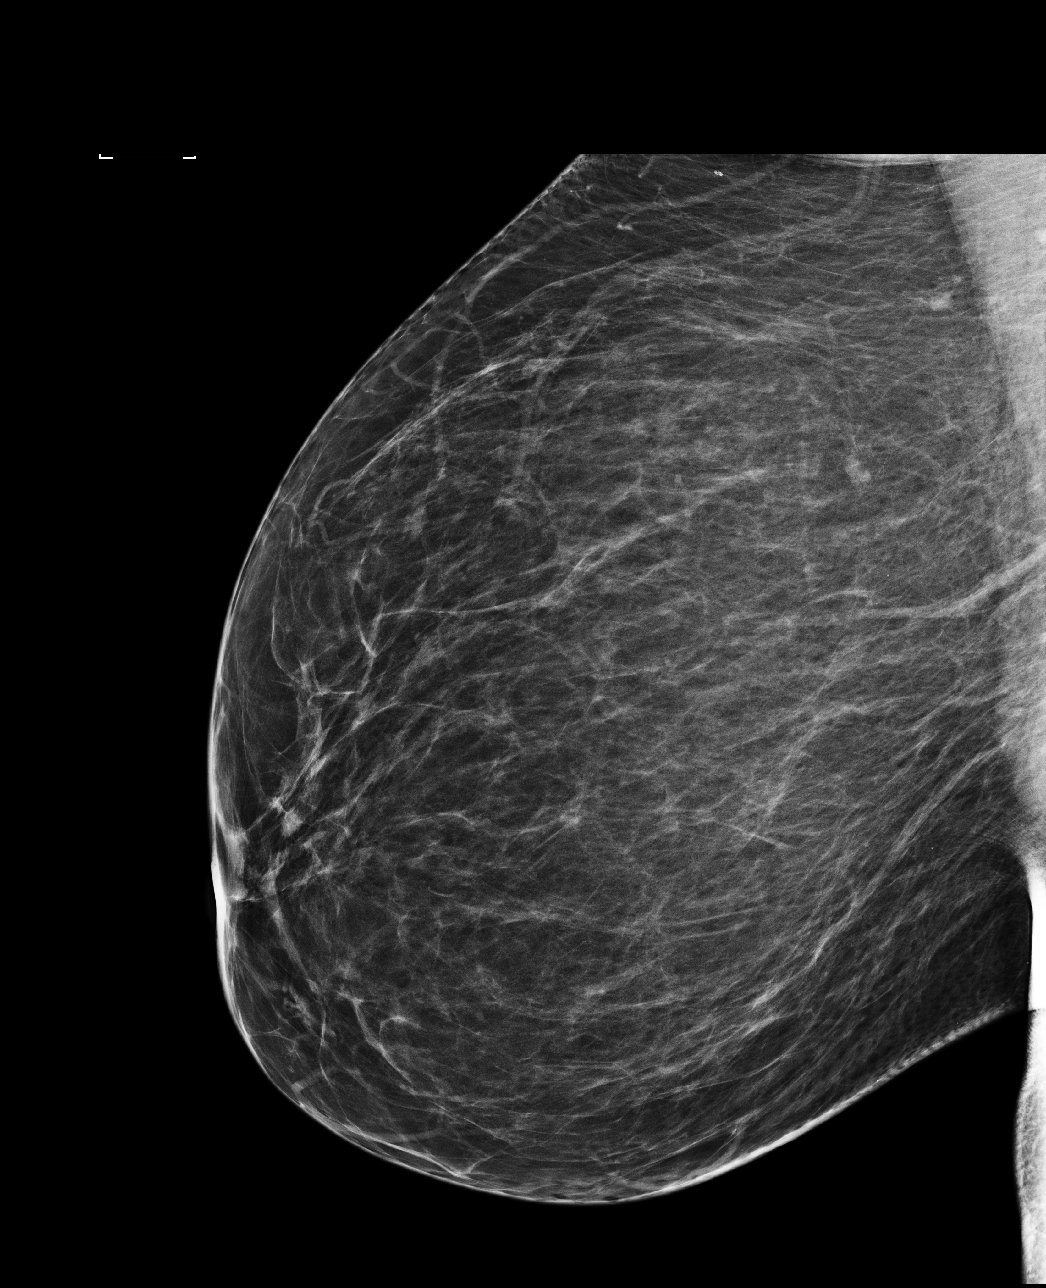

[4 of 4 positions shown; findings below may reference images not displayed]

ACR Breast Density Category b: There are scattered areas of
fibroglandular density.
FINDINGS: In the left breast, a possible asymmetries warrant further
evaluation. In the right breast, no findings suspicious for
malignancy. Images were processed with CAD.
IMPRESSION: Further evaluation is suggested for possible asymmetries in the left
breast.

RECOMMENDATION:
Diagnostic mammogram and possibly ultrasound of the left breast.
(Code:QV-[id number])

The patient will be contacted regarding the findings, and additional
imaging will be scheduled.

BI-RADS CATEGORY  0: Incomplete. Need additional imaging evaluation
and/or prior mammograms for comparison.

## 2017-09-10 ENCOUNTER — Other Ambulatory Visit: Payer: Self-pay | Admitting: Physician Assistant

## 2017-09-10 DIAGNOSIS — E785 Hyperlipidemia, unspecified: Secondary | ICD-10-CM

## 2017-09-10 MED ORDER — SIMVASTATIN 20 MG PO TABS: 20.0000 mg | ORAL_TABLET | Freq: Every day | ORAL | 3 refills | Status: DC

## 2017-09-10 NOTE — Telephone Encounter (Signed)
Please review. Thanks!  

## 2017-09-10 NOTE — Telephone Encounter (Signed)
[facility] faxed a refill request for the following medication. Thanks CC  simvastatin (ZOCOR) 20 MG tablet

## 2017-09-24 ENCOUNTER — Encounter: Payer: Self-pay | Admitting: Physician Assistant

## 2017-09-24 ENCOUNTER — Ambulatory Visit (INDEPENDENT_AMBULATORY_CARE_PROVIDER_SITE_OTHER): Payer: Federal, State, Local not specified - PPO | Admitting: Physician Assistant

## 2017-09-24 VITALS — BP 128/80 | HR 88 | Temp 98.3°F | Resp 16 | Ht 66.0 in | Wt 190.0 lb

## 2017-09-24 DIAGNOSIS — I10 Essential (primary) hypertension: Secondary | ICD-10-CM

## 2017-09-24 DIAGNOSIS — Z1239 Encounter for other screening for malignant neoplasm of breast: Secondary | ICD-10-CM

## 2017-09-24 DIAGNOSIS — E7800 Pure hypercholesterolemia, unspecified: Secondary | ICD-10-CM

## 2017-09-24 DIAGNOSIS — R7303 Prediabetes: Secondary | ICD-10-CM

## 2017-09-24 DIAGNOSIS — J302 Other seasonal allergic rhinitis: Secondary | ICD-10-CM

## 2017-09-24 DIAGNOSIS — E559 Vitamin D deficiency, unspecified: Secondary | ICD-10-CM | POA: Diagnosis not present

## 2017-09-24 DIAGNOSIS — Z1231 Encounter for screening mammogram for malignant neoplasm of breast: Secondary | ICD-10-CM | POA: Diagnosis not present

## 2017-09-24 DIAGNOSIS — Z1329 Encounter for screening for other suspected endocrine disorder: Secondary | ICD-10-CM

## 2017-09-24 DIAGNOSIS — Z0000 Encounter for general adult medical examination without abnormal findings: Secondary | ICD-10-CM

## 2017-09-24 MED ORDER — SIMVASTATIN 20 MG PO TABS: 20.0000 mg | ORAL_TABLET | Freq: Every day | ORAL | 3 refills | Status: DC

## 2017-09-24 MED ORDER — LEVOCETIRIZINE DIHYDROCHLORIDE 5 MG PO TABS: 5.0000 mg | ORAL_TABLET | Freq: Every evening | ORAL | 3 refills | Status: DC

## 2017-09-24 MED ORDER — TRIAMTERENE-HCTZ 37.5-25 MG PO TABS: 1.0000 | ORAL_TABLET | Freq: Every day | ORAL | 3 refills | Status: DC

## 2017-09-24 NOTE — Patient Instructions (Signed)
Health Maintenance for Postmenopausal Women Menopause is a normal process in which your reproductive ability comes to an end. This process happens gradually over a span of months to years, usually between the ages of 22 and 9. Menopause is complete when you have missed 12 consecutive menstrual periods. It is important to talk with your health care provider about some of the most common conditions that affect postmenopausal women, such as heart disease, cancer, and bone loss (osteoporosis). Adopting a healthy lifestyle and getting preventive care can help to promote your health and wellness. Those actions can also lower your chances of developing some of these common conditions. What should I know about menopause? During menopause, you may experience a number of symptoms, such as:  Moderate-to-severe hot flashes.  Night sweats.  Decrease in sex drive.  Mood swings.  Headaches.  Tiredness.  Irritability.  Memory problems.  Insomnia.  Choosing to treat or not to treat menopausal changes is an individual decision that you make with your health care provider. What should I know about hormone replacement therapy and supplements? Hormone therapy products are effective for treating symptoms that are associated with menopause, such as hot flashes and night sweats. Hormone replacement carries certain risks, especially as you become older. If you are thinking about using estrogen or estrogen with progestin treatments, discuss the benefits and risks with your health care provider. What should I know about heart disease and stroke? Heart disease, heart attack, and stroke become more likely as you age. This may be due, in part, to the hormonal changes that your body experiences during menopause. These can affect how your body processes dietary fats, triglycerides, and cholesterol. Heart attack and stroke are both medical emergencies. There are many things that you can do to help prevent heart disease  and stroke:  Have your blood pressure checked at least every 1-2 years. High blood pressure causes heart disease and increases the risk of stroke.  If you are 53-22 years old, ask your health care provider if you should take aspirin to prevent a heart attack or a stroke.  Do not use any tobacco products, including cigarettes, chewing tobacco, or electronic cigarettes. If you need help quitting, ask your health care provider.  It is important to eat a healthy diet and maintain a healthy weight. ? Be sure to include plenty of vegetables, fruits, low-fat dairy products, and lean protein. ? Avoid eating foods that are high in solid fats, added sugars, or salt (sodium).  Get regular exercise. This is one of the most important things that you can do for your health. ? Try to exercise for at least 150 minutes each week. The type of exercise that you do should increase your heart rate and make you sweat. This is known as moderate-intensity exercise. ? Try to do strengthening exercises at least twice each week. Do these in addition to the moderate-intensity exercise.  Know your numbers.Ask your health care provider to check your cholesterol and your blood glucose. Continue to have your blood tested as directed by your health care provider.  What should I know about cancer screening? There are several types of cancer. Take the following steps to reduce your risk and to catch any cancer development as early as possible. Breast Cancer  Practice breast self-awareness. ? This means understanding how your breasts normally appear and feel. ? It also means doing regular breast self-exams. Let your health care provider know about any changes, no matter how small.  If you are 40  or older, have a clinician do a breast exam (clinical breast exam or CBE) every year. Depending on your age, family history, and medical history, it may be recommended that you also have a yearly breast X-ray (mammogram).  If you  have a family history of breast cancer, talk with your health care provider about genetic screening.  If you are at high risk for breast cancer, talk with your health care provider about having an MRI and a mammogram every year.  Breast cancer (BRCA) gene test is recommended for women who have family members with BRCA-related cancers. Results of the assessment will determine the need for genetic counseling and BRCA1 and for BRCA2 testing. BRCA-related cancers include these types: ? Breast. This occurs in males or females. ? Ovarian. ? Tubal. This may also be called fallopian tube cancer. ? Cancer of the abdominal or pelvic lining (peritoneal cancer). ? Prostate. ? Pancreatic.  Cervical, Uterine, and Ovarian Cancer Your health care provider may recommend that you be screened regularly for cancer of the pelvic organs. These include your ovaries, uterus, and vagina. This screening involves a pelvic exam, which includes checking for microscopic changes to the surface of your cervix (Pap test).  For women ages 21-65, health care providers may recommend a pelvic exam and a Pap test every three years. For women ages 79-65, they may recommend the Pap test and pelvic exam, combined with testing for human papilloma virus (HPV), every five years. Some types of HPV increase your risk of cervical cancer. Testing for HPV may also be done on women of any age who have unclear Pap test results.  Other health care providers may not recommend any screening for nonpregnant women who are considered low risk for pelvic cancer and have no symptoms. Ask your health care provider if a screening pelvic exam is right for you.  If you have had past treatment for cervical cancer or a condition that could lead to cancer, you need Pap tests and screening for cancer for at least 20 years after your treatment. If Pap tests have been discontinued for you, your risk factors (such as having a new sexual partner) need to be  reassessed to determine if you should start having screenings again. Some women have medical problems that increase the chance of getting cervical cancer. In these cases, your health care provider may recommend that you have screening and Pap tests more often.  If you have a family history of uterine cancer or ovarian cancer, talk with your health care provider about genetic screening.  If you have vaginal bleeding after reaching menopause, tell your health care provider.  There are currently no reliable tests available to screen for ovarian cancer.  Lung Cancer Lung cancer screening is recommended for adults 69-62 years old who are at high risk for lung cancer because of a history of smoking. A yearly low-dose CT scan of the lungs is recommended if you:  Currently smoke.  Have a history of at least 30 pack-years of smoking and you currently smoke or have quit within the past 15 years. A pack-year is smoking an average of one pack of cigarettes per day for one year.  Yearly screening should:  Continue until it has been 15 years since you quit.  Stop if you develop a health problem that would prevent you from having lung cancer treatment.  Colorectal Cancer  This type of cancer can be detected and can often be prevented.  Routine colorectal cancer screening usually begins at  age 42 and continues through age 45.  If you have risk factors for colon cancer, your health care provider may recommend that you be screened at an earlier age.  If you have a family history of colorectal cancer, talk with your health care provider about genetic screening.  Your health care provider may also recommend using home test kits to check for hidden blood in your stool.  A small camera at the end of a tube can be used to examine your colon directly (sigmoidoscopy or colonoscopy). This is done to check for the earliest forms of colorectal cancer.  Direct examination of the colon should be repeated every  5-10 years until age 71. However, if early forms of precancerous polyps or small growths are found or if you have a family history or genetic risk for colorectal cancer, you may need to be screened more often.  Skin Cancer  Check your skin from head to toe regularly.  Monitor any moles. Be sure to tell your health care provider: ? About any new moles or changes in moles, especially if there is a change in a mole's shape or color. ? If you have a mole that is larger than the size of a pencil eraser.  If any of your family members has a history of skin cancer, especially at a young age, talk with your health care provider about genetic screening.  Always use sunscreen. Apply sunscreen liberally and repeatedly throughout the day.  Whenever you are outside, protect yourself by wearing long sleeves, pants, a wide-brimmed hat, and sunglasses.  What should I know about osteoporosis? Osteoporosis is a condition in which bone destruction happens more quickly than new bone creation. After menopause, you may be at an increased risk for osteoporosis. To help prevent osteoporosis or the bone fractures that can happen because of osteoporosis, the following is recommended:  If you are 46-71 years old, get at least 1,000 mg of calcium and at least 600 mg of vitamin D per day.  If you are older than age 55 but younger than age 65, get at least 1,200 mg of calcium and at least 600 mg of vitamin D per day.  If you are older than age 54, get at least 1,200 mg of calcium and at least 800 mg of vitamin D per day.  Smoking and excessive alcohol intake increase the risk of osteoporosis. Eat foods that are rich in calcium and vitamin D, and do weight-bearing exercises several times each week as directed by your health care provider. What should I know about how menopause affects my mental health? Depression may occur at any age, but it is more common as you become older. Common symptoms of depression  include:  Low or sad mood.  Changes in sleep patterns.  Changes in appetite or eating patterns.  Feeling an overall lack of motivation or enjoyment of activities that you previously enjoyed.  Frequent crying spells.  Talk with your health care provider if you think that you are experiencing depression. What should I know about immunizations? It is important that you get and maintain your immunizations. These include:  Tetanus, diphtheria, and pertussis (Tdap) booster vaccine.  Influenza every year before the flu season begins.  Pneumonia vaccine.  Shingles vaccine.  Your health care provider may also recommend other immunizations. This information is not intended to replace advice given to you by your health care provider. Make sure you discuss any questions you have with your health care provider. Document Released: 06/26/2005  Document Revised: 11/22/2015 Document Reviewed: 02/05/2015 Elsevier Interactive Patient Education  2018 [employer]  

## 2017-09-24 NOTE — Progress Notes (Signed)
Patient: Krista Cooper, Female    DOB: 23-Feb-1956, 62 y.o.   MRN: [id number] Visit Date: 09/24/2017  Today's Provider: Margaretann Loveless, PA-C   Chief Complaint  Patient presents with  . Annual Exam   Subjective:    Annual physical exam Krista Cooper is a 62 y.o. female who presents today for health maintenance and complete physical. She feels well. She reports exercising daily. She reports she is sleeping well.  09/23/16 CPE 09/23/16 Pap-neg; HPV-neg 12/16/16 Mammogram-BI-RADS 1 05/09/07 Colonoscopy-WNL 10/15/16 Cologuard-Negative -----------------------------------------------------------------   Review of Systems  Constitutional: Negative.   HENT: Positive for congestion and sneezing.   Eyes: Negative.   Respiratory: Negative.   Cardiovascular: Negative.   Gastrointestinal: Negative.   Endocrine: Negative.   Genitourinary: Negative.   Musculoskeletal: Positive for arthralgias and myalgias.  Skin: Negative.   Allergic/Immunologic: Negative.   Neurological: Negative.   Hematological: Negative.   Psychiatric/Behavioral: Negative.     Social History      She  reports that she has never smoked. She has never used smokeless tobacco. She reports that she drinks alcohol. She reports that she does not use drugs.       Social History   Socioeconomic History  . Marital status: Married    Spouse name: Not on file  . Number of children: Not on file  . Years of education: Not on file  . Highest education level: Not on file  Occupational History  . 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 Financial resource strain: Not on file  . Food insecurity:    Worry: Not on file    Inability: Not on file  . Transportation needs:    Medical: Not on file    Non-medical: Not on file  Tobacco Use  . Smoking status: Never Smoker  . Smokeless tobacco: Never Used  Substance and Sexual Activity  . Alcohol use: Yes    Comment:  rare glass of wine  . Drug use: No  . Sexual activity: Not on file  Lifestyle  . Physical activity:    Days per week: Not on file    Minutes per session: Not on file  . Stress: Not on file  Relationships  . Social connections:    Talks on phone: Not on file    Gets together: Not on file    Attends religious service: Not on file    Active member of club or organization: Not on file    Attends meetings of clubs or organizations: Not on file    Relationship status: Not on file  Other Topics Concern  . Not on file  Social History Narrative  . Not on file    Past Medical History:  Diagnosis Date  . Allergy   . Anxiety   . Hyperlipidemia   . Hypertension      Patient Active Problem List   Diagnosis Date Noted  . White coat syndrome without diagnosis of hypertension 03/26/2017  . 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 Allergic rhinitis 02/14/2015  . Anxiety 02/14/2015  . Hypercholesteremia 02/14/2015  . BP (high blood pressure) 02/14/2015  . Cannot sleep 02/14/2015  . Borderline diabetes 02/14/2015  . Avitaminosis D 02/14/2015  . Hyperlipemia 12/25/2014    Past Surgical History:  Procedure Laterality Date  . CESAREAN SECTION    . FRACTURE SURGERY Right 1997   PLATES AND PINS  . MOUTH SURGERY  1976  WISDOM TEETH REMOVED    Family History        Family Status  Relation Name Status  . Mother  Deceased  . Father  Deceased at age 38       LUNG CANCER  . PGF  Deceased       MI  . Daughter  Deceased       MVA  . MGM  (Not Specified)        Her family history includes COPD in her mother; CVA in her maternal grandmother; Cancer in her father; Congestive Heart Failure in her father; Diabetes in her mother and paternal grandfather; Hyperlipidemia in her mother; Hypertension in her father and mother.      Allergies  Allergen Reactions  . Codeine     Itching; Can take Cough Syrup  . Lisinopril Cough     Current Outpatient Medications:  .  aspirin 81 MG tablet,  Take 1 tablet by mouth daily., Disp: , Rfl:  .  B Complex Vitamins (VITAMIN B COMPLEX PO), Take 1 tablet by mouth daily., Disp: , Rfl:  .  Cholecalciferol 10000 UNITS CAPS, Take 2 capsules by mouth daily. , Disp: , Rfl:  .  fluticasone (FLONASE) 50 MCG/ACT nasal spray, Place 2 sprays into both nostrils daily., Disp: 48 g, Rfl: 1 .  levocetirizine (XYZAL) 5 MG tablet, Take 5 mg by mouth every evening., Disp: , Rfl:  .  MULTIPLE VITAMIN PO, Take 1 tablet by mouth daily., Disp: , Rfl:  .  omega-3 fish oil (MAXEPA) 1000 MG CAPS capsule, Take 1 capsule by mouth daily., Disp: , Rfl:  .  simvastatin (ZOCOR) 20 MG tablet, Take 1 tablet (20 mg total) by mouth at bedtime., Disp: 90 tablet, Rfl: 3 .  triamterene-hydrochlorothiazide (MAXZIDE-25) 37.5-25 MG tablet, Take 1 tablet by mouth daily., Disp: 90 tablet, Rfl: 3   Patient Care Team: Margaretann Loveless, PA-C as PCP - General (Family Medicine)      Objective:   Vitals: BP 128/80 (BP Location: Right Arm, Patient Position: Sitting, Cuff Size: Large)   Pulse 88   Temp 98.3 F (36.8 C) (Oral)   Resp 16   Ht  (1.676 m)   Wt 190 lb (86.2 kg)   SpO2 99%   BMI 30.67 kg/m    Vitals:   09/24/17 0911  BP: 128/80  Pulse: 88  Resp: 16  Temp: 98.3 F (36.8 C)  TempSrc: Oral  SpO2: 99%  Weight: 190 lb (86.2 kg)  Height:  (1.676 m)     Physical Exam  Constitutional: She is oriented to person, place, and time. She appears well-developed and well-nourished. No distress.  HENT:  Head: Normocephalic and atraumatic.  Right Ear: Hearing, tympanic membrane, external ear and ear canal normal.  Left Ear: Hearing, tympanic membrane, external ear and ear canal normal.  Nose: Nose normal.  Mouth/Throat: Uvula is midline, oropharynx is clear and moist and mucous membranes are normal. No oropharyngeal exudate.  Eyes: Pupils are equal, round, and reactive to light. Conjunctivae and EOM are normal. Right eye exhibits no discharge. Left eye  exhibits no discharge. No scleral icterus.  Neck: Normal range of motion. Neck supple. No JVD present. Carotid bruit is not present. No tracheal deviation present. No thyromegaly present.  Cardiovascular: Normal rate, regular rhythm, normal heart sounds and intact distal pulses. Exam reveals no gallop and no friction rub.  No murmur heard. Pulmonary/Chest: Effort normal and breath sounds normal. No respiratory distress. She has no  wheezes. She has no rales. She exhibits no tenderness. Right breast exhibits no inverted nipple, no mass, no nipple discharge, no skin change and no tenderness. Left breast exhibits no inverted nipple, no mass, no nipple discharge, no skin change and no tenderness. No breast tenderness or discharge. Breasts are symmetrical.  Abdominal: Soft. Bowel sounds are normal. She exhibits no distension and no mass. There is no tenderness. There is no rebound and no guarding.  Musculoskeletal: Normal range of motion. She exhibits no edema or tenderness.  Lymphadenopathy:    She has no cervical adenopathy.  Neurological: She is alert and oriented to person, place, and time.  Skin: Skin is warm and dry. No rash noted. She is not diaphoretic.  Psychiatric: She has a normal mood and affect. Her behavior is normal. Judgment and thought content normal.  Vitals reviewed.    Depression Screen PHQ 2/9 Scores 09/24/2017 09/23/2016 09/16/2015  PHQ - 2 Score 0 0 0  PHQ- 9 Score 0 0 -      Assessment & Plan:     Routine Health Maintenance and Physical Exam  Exercise Activities and Dietary recommendations Goals    None      Immunization History  Administered Date(s) Administered  . Influenza,inj,Quad PF,6+ Mos 02/02/2014, 02/15/2015, 03/19/2016  . Influenza-Unspecified 03/16/2017  . Td 03/19/2016  . Tdap 10/15/2005    Health Maintenance  Topic Date Due  . INFLUENZA VACCINE  12/16/2017  . MAMMOGRAM  12/17/2018  . PAP SMEAR  09/24/2019  . Fecal DNA (Cologuard)  10/16/2019    . TETANUS/TDAP  03/19/2026  . Hepatitis C Screening  Completed  . HIV Screening  Completed     Discussed health benefits of physical activity, and encouraged her to engage in regular exercise appropriate for her age and condition.    1. Annual physical exam Normal physical exam today. Will check labs as below and f/u pending lab results. If labs are stable and WNL she will not need to have these rechecked for one year at her next annual physical exam. She is to call the office in the meantime if she has any acute issue, questions or concerns.  2. Screening for breast cancer Breast exam today was normal. There is no family history of breast cancer. She does perform regular self breast exams. Mammogram was ordered as below. Information for [facility] Breast clinic was given to patient so she may schedule her mammogram at her convenience. - MM DIGITAL SCREENING BILATERAL; Future  3. Essential hypertension Stable. Diagnosis pulled for medication refill. Continue current medical treatment plan. Will check labs as below and f/u pending results. Will see her back in 6 months for chronic illness rechecks.  - CBC with Differential/Platelet - Comprehensive metabolic panel - triamterene-hydrochlorothiazide (MAXZIDE-25) 37.5-25 MG tablet; Take 1 tablet by mouth daily.  Dispense: 90 tablet; Refill: 3  4. Hypercholesteremia Stable. Diagnosis pulled for medication refill. Continue current medical treatment plan. Will check labs as below and f/u pending results. - Lipid Panel With LDL/HDL Ratio - simvastatin (ZOCOR) 20 MG tablet; Take 1 tablet (20 mg total) by mouth at bedtime.  Dispense: 90 tablet; Refill: 3  5. Screening for thyroid disorder Will check labs as below and f/u pending results. - TSH  6. Borderline diabetes Diet controlled. Will check labs as below and f/u pending results. - Hemoglobin A1c  7. Avitaminosis D Postmenopausal and h/o this. Will check labs as below and f/u pending  results. - VITAMIN D 25 Hydroxy (Vit-D Deficiency, Fractures)  8. Other seasonal allergic rhinitis Stable. Diagnosis pulled for medication refill. Continue current medical treatment plan. - levocetirizine (XYZAL) 5 MG tablet; Take 1 tablet (5 mg total) by mouth every evening.  Dispense: 90 tablet; Refill: 3  --------------------------------------------------------------------    Margaretann Loveless, PA-C  [facility]

## 2017-10-14 DIAGNOSIS — R7303 Prediabetes: Secondary | ICD-10-CM | POA: Diagnosis not present

## 2017-10-14 DIAGNOSIS — E7800 Pure hypercholesterolemia, unspecified: Secondary | ICD-10-CM | POA: Diagnosis not present

## 2017-10-14 DIAGNOSIS — E559 Vitamin D deficiency, unspecified: Secondary | ICD-10-CM | POA: Diagnosis not present

## 2017-10-14 DIAGNOSIS — I10 Essential (primary) hypertension: Secondary | ICD-10-CM | POA: Diagnosis not present

## 2017-10-14 DIAGNOSIS — Z1329 Encounter for screening for other suspected endocrine disorder: Secondary | ICD-10-CM | POA: Diagnosis not present

## 2017-10-15 ENCOUNTER — Telehealth: Payer: Self-pay

## 2017-10-15 LAB — CBC WITH DIFFERENTIAL/PLATELET
Basophils Absolute: 0 10*3/uL (ref 0.0–0.2)
Basos: 0 %
EOS (ABSOLUTE): 0.1 10*3/uL (ref 0.0–0.4)
EOS: 1 %
HEMATOCRIT: 41.4 % (ref 34.0–46.6)
HEMOGLOBIN: 13.6 g/dL (ref 11.1–15.9)
Immature Grans (Abs): 0 10*3/uL (ref 0.0–0.1)
Immature Granulocytes: 0 %
Lymphocytes Absolute: 1.5 10*3/uL (ref 0.7–3.1)
Lymphs: 34 %
MCH: 29.1 pg (ref 26.6–33.0)
MCHC: 32.9 g/dL (ref 31.5–35.7)
MCV: 89 fL (ref 79–97)
MONOCYTES: 8 %
MONOS ABS: 0.3 10*3/uL (ref 0.1–0.9)
NEUTROS ABS: 2.4 10*3/uL (ref 1.4–7.0)
Neutrophils: 57 %
Platelets: 327 10*3/uL (ref 150–450)
RBC: 4.68 x10E6/uL (ref 3.77–5.28)
RDW: 14.1 % (ref 12.3–15.4)
WBC: 4.2 10*3/uL (ref 3.4–10.8)

## 2017-10-15 LAB — LIPID PANEL WITH LDL/HDL RATIO
Cholesterol, Total: 200 mg/dL — ABNORMAL HIGH (ref 100–199)
HDL: 72 mg/dL (ref >39)
LDL Calculated: 115 mg/dL — ABNORMAL HIGH (ref 0–99)
LDL/HDL RATIO: 1.6 ratio (ref 0.0–3.2)
Triglycerides: 64 mg/dL (ref 0–149)
VLDL Cholesterol Cal: 13 mg/dL (ref 5–40)

## 2017-10-15 LAB — COMPREHENSIVE METABOLIC PANEL
A/G RATIO: 2.3 — AB (ref 1.2–2.2)
ALBUMIN: 4.4 g/dL (ref 3.6–4.8)
ALT: 27 IU/L (ref 0–32)
AST: 33 IU/L (ref 0–40)
Alkaline Phosphatase: 59 IU/L (ref 39–117)
BILIRUBIN TOTAL: 0.7 mg/dL (ref 0.0–1.2)
BUN / CREAT RATIO: 20 (ref 12–28)
BUN: 14 mg/dL (ref 8–27)
CHLORIDE: 100 mmol/L (ref 96–106)
CO2: 24 mmol/L (ref 20–29)
CREATININE: 0.71 mg/dL (ref 0.57–1.00)
Calcium: 9.3 mg/dL (ref 8.7–10.3)
GFR calc Af Amer: 106 mL/min/{1.73_m2} (ref >59)
GFR calc non Af Amer: 92 mL/min/{1.73_m2} (ref >59)
GLOBULIN, TOTAL: 1.9 g/dL (ref 1.5–4.5)
Glucose: 106 mg/dL — ABNORMAL HIGH (ref 65–99)
POTASSIUM: 3.9 mmol/L (ref 3.5–5.2)
SODIUM: 140 mmol/L (ref 134–144)
Total Protein: 6.3 g/dL (ref 6.0–8.5)

## 2017-10-15 LAB — TSH: TSH: 2.89 u[IU]/mL (ref 0.450–4.500)

## 2017-10-15 LAB — HEMOGLOBIN A1C
Est. average glucose Bld gHb Est-mCnc: 126 mg/dL
HEMOGLOBIN A1C: 6 % — AB (ref 4.8–5.6)

## 2017-10-15 LAB — VITAMIN D 25 HYDROXY (VIT D DEFICIENCY, FRACTURES): Vit D, 25-Hydroxy: 31.8 ng/mL (ref 30.0–100.0)

## 2017-10-15 NOTE — Telephone Encounter (Signed)
Patient advised as below.  

## 2017-10-15 NOTE — Telephone Encounter (Signed)
-----   Message from Margaretann LovelessJennifer M Burnette, PA-C sent at 10/15/2017 10:10 AM EDT ----- Cholesterol up from last year. A1c doing ok at 6.0. Blood count normal. Kidney and liver function normal. Thyroid normal. Vit D normal but low normal. Continue an OTC Vit D supplement of [phone] IU daily.

## 2017-10-27 DIAGNOSIS — K080 Exfoliation of teeth due to systemic causes: Secondary | ICD-10-CM | POA: Diagnosis not present

## 2017-12-17 ENCOUNTER — Ambulatory Visit
Admission: RE | Admit: 2017-12-17 | Discharge: 2017-12-17 | Disposition: A | Discharge status: Home / Self Care | Payer: Federal, State, Local not specified - PPO | Source: Ambulatory Visit | Attending: Physician Assistant | Admitting: Physician Assistant

## 2017-12-17 DIAGNOSIS — Z1239 Encounter for other screening for malignant neoplasm of breast: Secondary | ICD-10-CM

## 2017-12-17 DIAGNOSIS — Z1231 Encounter for screening mammogram for malignant neoplasm of breast: Secondary | ICD-10-CM | POA: Diagnosis not present

## 2017-12-20 ENCOUNTER — Telehealth: Payer: Self-pay

## 2017-12-20 NOTE — Telephone Encounter (Signed)
-----   Message from Margaretann LovelessJennifer M Burnette, PA-C sent at 12/20/2017  8:46 AM EDT ----- Normal mammogram. Repeat screening in one year.

## 2017-12-20 NOTE — Telephone Encounter (Signed)
Tried calling; no answer.   Thanks,   -Jordanna Hendrie  

## 2017-12-22 NOTE — Telephone Encounter (Signed)
Patient advised as directed below.  Thanks,  -Joseline 

## 2018-03-28 ENCOUNTER — Encounter: Payer: Self-pay | Admitting: Physician Assistant

## 2018-03-28 ENCOUNTER — Ambulatory Visit: Payer: Federal, State, Local not specified - PPO | Admitting: Physician Assistant

## 2018-03-28 VITALS — BP 168/90 | HR 86 | Temp 97.9°F | Resp 16 | Wt 191.4 lb

## 2018-03-28 DIAGNOSIS — Z6830 Body mass index (BMI) 30.0-30.9, adult: Secondary | ICD-10-CM

## 2018-03-28 DIAGNOSIS — E7800 Pure hypercholesterolemia, unspecified: Secondary | ICD-10-CM

## 2018-03-28 DIAGNOSIS — J302 Other seasonal allergic rhinitis: Secondary | ICD-10-CM

## 2018-03-28 DIAGNOSIS — Z23 Encounter for immunization: Secondary | ICD-10-CM | POA: Diagnosis not present

## 2018-03-28 DIAGNOSIS — R030 Elevated blood-pressure reading, without diagnosis of hypertension: Secondary | ICD-10-CM | POA: Diagnosis not present

## 2018-03-28 MED ORDER — FLUTICASONE PROPIONATE 50 MCG/ACT NA SUSP: 2.0000 | Freq: Every day | NASAL | 1 refills | Status: DC

## 2018-03-28 NOTE — Patient Instructions (Signed)
DASH Eating Plan DASH stands for "Dietary Approaches to Stop Hypertension." The DASH eating plan is a healthy eating plan that has been shown to reduce high blood pressure (hypertension). It may also reduce your risk for type 2 diabetes, heart disease, and stroke. The DASH eating plan may also help with weight loss. What are tips for following this plan? General guidelines  Avoid eating more than 2,300 mg (milligrams) of salt (sodium) a day. If you have hypertension, you may need to reduce your sodium intake to 1,500 mg a day.  Limit alcohol intake to no more than 1 drink a day for nonpregnant women and 2 drinks a day for men. One drink equals 12 oz of beer, 5 oz of wine, or 1 oz of hard liquor.  Work with your health care provider to maintain a healthy body weight or to lose weight. Ask what an ideal weight is for you.  Get at least 30 minutes of exercise that causes your heart to beat faster (aerobic exercise) most days of the week. Activities may include walking, swimming, or biking.  Work with your health care provider or diet and nutrition specialist (dietitian) to adjust your eating plan to your individual calorie needs. Reading food labels  Check food labels for the amount of sodium per serving. Choose foods with less than 5 percent of the Daily Value of sodium. Generally, foods with less than 300 mg of sodium per serving fit into this eating plan.  To find whole grains, look for the word "whole" as the first word in the ingredient list. Shopping  Buy products labeled as "low-sodium" or "no salt added."  Buy fresh foods. Avoid canned foods and premade or frozen meals. Cooking  Avoid adding salt when cooking. Use salt-free seasonings or herbs instead of table salt or sea salt. Check with your health care provider or pharmacist before using salt substitutes.  Do not fry foods. Cook foods using healthy methods such as baking, boiling, grilling, and broiling instead.  Cook with  heart-healthy oils, such as olive, canola, soybean, or sunflower oil. Meal planning   Eat a balanced diet that includes: ? 5 or more servings of fruits and vegetables each day. At each meal, try to fill half of your plate with fruits and vegetables. ? Up to 6-8 servings of whole grains each day. ? Less than 6 oz of lean meat, poultry, or fish each day. A 3-oz serving of meat is about the same size as a deck of cards. One egg equals 1 oz. ? 2 servings of low-fat dairy each day. ? A serving of nuts, seeds, or beans 5 times each week. ? Heart-healthy fats. Healthy fats called Omega-3 fatty acids are found in foods such as flaxseeds and coldwater fish, like sardines, salmon, and mackerel.  Limit how much you eat of the following: ? Canned or prepackaged foods. ? Food that is high in trans fat, such as fried foods. ? Food that is high in saturated fat, such as fatty meat. ? Sweets, desserts, sugary drinks, and other foods with added sugar. ? Full-fat dairy products.  Do not salt foods before eating.  Try to eat at least 2 vegetarian meals each week.  Eat more home-cooked food and less restaurant, buffet, and fast food.  When eating at a restaurant, ask that your food be prepared with less salt or no salt, if possible. What foods are recommended? The items listed may not be a complete list. Talk with your dietitian about what   dietary choices are best for you. Grains Whole-grain or whole-wheat bread. Whole-grain or whole-wheat pasta. Brown rice. Oatmeal. Quinoa. Bulgur. Whole-grain and low-sodium cereals. Pita bread. Low-fat, low-sodium crackers. Whole-wheat flour tortillas. Vegetables Fresh or frozen vegetables (raw, steamed, roasted, or grilled). Low-sodium or reduced-sodium tomato and vegetable juice. Low-sodium or reduced-sodium tomato sauce and tomato paste. Low-sodium or reduced-sodium canned vegetables. Fruits All fresh, dried, or frozen fruit. Canned fruit in natural juice (without  added sugar). Meat and other protein foods Skinless chicken or turkey. Ground chicken or turkey. Pork with fat trimmed off. Fish and seafood. Egg whites. Dried beans, peas, or lentils. Unsalted nuts, nut butters, and seeds. Unsalted canned beans. Lean cuts of beef with fat trimmed off. Low-sodium, lean deli meat. Dairy Low-fat (1%) or fat-free (skim) milk. Fat-free, low-fat, or reduced-fat cheeses. Nonfat, low-sodium ricotta or cottage cheese. Low-fat or nonfat yogurt. Low-fat, low-sodium cheese. Fats and oils Soft margarine without trans fats. Vegetable oil. Low-fat, reduced-fat, or light mayonnaise and salad dressings (reduced-sodium). Canola, safflower, olive, soybean, and sunflower oils. Avocado. Seasoning and other foods Herbs. Spices. Seasoning mixes without salt. Unsalted popcorn and pretzels. Fat-free sweets. What foods are not recommended? The items listed may not be a complete list. Talk with your dietitian about what dietary choices are best for you. Grains Baked goods made with fat, such as croissants, muffins, or some breads. Dry pasta or rice meal packs. Vegetables Creamed or fried vegetables. Vegetables in a cheese sauce. Regular canned vegetables (not low-sodium or reduced-sodium). Regular canned tomato sauce and paste (not low-sodium or reduced-sodium). Regular tomato and vegetable juice (not low-sodium or reduced-sodium). Pickles. Olives. Fruits Canned fruit in a light or heavy syrup. Fried fruit. Fruit in cream or butter sauce. Meat and other protein foods Fatty cuts of meat. Ribs. Fried meat. Bacon. Sausage. Bologna and other processed lunch meats. Salami. Fatback. Hotdogs. Bratwurst. Salted nuts and seeds. Canned beans with added salt. Canned or smoked fish. Whole eggs or egg yolks. Chicken or turkey with skin. Dairy Whole or 2% milk, cream, and half-and-half. Whole or full-fat cream cheese. Whole-fat or sweetened yogurt. Full-fat cheese. Nondairy creamers. Whipped toppings.  Processed cheese and cheese spreads. Fats and oils Butter. Stick margarine. Lard. Shortening. Ghee. Bacon fat. Tropical oils, such as coconut, palm kernel, or palm oil. Seasoning and other foods Salted popcorn and pretzels. Onion salt, garlic salt, seasoned salt, table salt, and sea salt. Worcestershire sauce. Tartar sauce. Barbecue sauce. Teriyaki sauce. Soy sauce, including reduced-sodium. Steak sauce. Canned and packaged gravies. Fish sauce. Oyster sauce. Cocktail sauce. Horseradish that you find on the shelf. Ketchup. Mustard. Meat flavorings and tenderizers. Bouillon cubes. Hot sauce and Tabasco sauce. Premade or packaged marinades. Premade or packaged taco seasonings. Relishes. Regular salad dressings. Where to find more information:  National Heart, Lung, and Blood Institute: www.nhlbi.nih.gov  American Heart Association: www.heart.org Summary  The DASH eating plan is a healthy eating plan that has been shown to reduce high blood pressure (hypertension). It may also reduce your risk for type 2 diabetes, heart disease, and stroke.  With the DASH eating plan, you should limit salt (sodium) intake to 2,300 mg a day. If you have hypertension, you may need to reduce your sodium intake to 1,500 mg a day.  When on the DASH eating plan, aim to eat more fresh fruits and vegetables, whole grains, lean proteins, low-fat dairy, and heart-healthy fats.  Work with your health care provider or diet and nutrition specialist (dietitian) to adjust your eating plan to your individual   calorie needs. This information is not intended to replace advice given to you by your health care provider. Make sure you discuss any questions you have with your health care provider. Document Released: 04/23/2011 Document Revised: 04/27/2016 Document Reviewed: 04/27/2016 Elsevier Interactive Patient Education  2018 [employer]  

## 2018-03-28 NOTE — Progress Notes (Signed)
Patient: Krista Cooper Female    DOB: 28-Feb-1956   62 y.o.   MRN: [id number] Visit Date: 03/28/2018  Today's Provider: Margaretann Loveless, PA-C   Chief Complaint  Patient presents with  . Follow-up    Hypertension,hyperlipedimia and borderline diabetes   Subjective:    HPI  Hypertension, follow-up:  BP Readings from Last 3 Encounters:  03/28/18 (!) 168/90  09/24/17 128/80  03/26/17 140/86    She was last seen for hypertension 6 months ago.  BP at that visit was 128/80. Management since that visit includes none. She reports excellent compliance with treatment. She is not having side effects.  She is exercising. She is adherent to low salt diet.   Outside blood pressures are 120's-130's/70's. She is experiencing none.  Patient denies chest pain, chest pressure/discomfort, exertional chest pressure/discomfort, fatigue, irregular heart beat, lower extremity edema, near-syncope and palpitations.   Cardiovascular risk factors include dyslipidemia and hypertension.    Weight trend: stable Wt Readings from Last 3 Encounters:  03/28/18 191 lb 6.4 oz (86.8 kg)  09/24/17 190 lb (86.2 kg)  03/26/17 191 lb 12.8 oz (87 kg)    Current diet: in general, a "healthy" diet    ------------------------------------------------------------------------   Lipid/Cholesterol, Follow-up:   Last seen for this 6 months ago.  Management changes since that visit include none. . Last Lipid Panel:    Component Value Date/Time   CHOL 200 (H) 10/14/2017 0939   TRIG 64 10/14/2017 0939   HDL 72 10/14/2017 0939   CHOLHDL 2.7 10/15/2016 0919   LDLCALC 115 (H) 10/14/2017 0939    Risk factors for vascular disease include hypercholesterolemia and hypertension  She reports excellent compliance with treatment. She is not having side effects.  Current symptoms include none and have been stable.  -------------------------------------------------------------------     Allergies    Allergen Reactions  . Codeine     Itching; Can take Cough Syrup  . Lisinopril Cough     Current Outpatient Medications:  .  aspirin 81 MG tablet, Take 1 tablet by mouth daily., Disp: , Rfl:  .  B Complex Vitamins (VITAMIN B COMPLEX PO), Take 1 tablet by mouth daily., Disp: , Rfl:  .  Cholecalciferol 10000 UNITS CAPS, Take 2 capsules by mouth daily. , Disp: , Rfl:  .  fluticasone (FLONASE) 50 MCG/ACT nasal spray, Place 2 sprays into both nostrils daily., Disp: 48 g, Rfl: 1 .  levocetirizine (XYZAL) 5 MG tablet, Take 1 tablet (5 mg total) by mouth every evening., Disp: 90 tablet, Rfl: 3 .  MULTIPLE VITAMIN PO, Take 1 tablet by mouth daily., Disp: , Rfl:  .  omega-3 fish oil (MAXEPA) 1000 MG CAPS capsule, Take 1 capsule by mouth daily., Disp: , Rfl:  .  simvastatin (ZOCOR) 20 MG tablet, Take 1 tablet (20 mg total) by mouth at bedtime., Disp: 90 tablet, Rfl: 3 .  triamterene-hydrochlorothiazide (MAXZIDE-25) 37.5-25 MG tablet, Take 1 tablet by mouth daily., Disp: 90 tablet, Rfl: 3  Review of Systems  Constitutional: Negative.   Respiratory: Negative.   Cardiovascular: Negative.   Neurological: Negative.   Psychiatric/Behavioral: Negative.     Social History   Tobacco Use  . Smoking status: Never Smoker  . Smokeless tobacco: Never Used  Substance Use Topics  . Alcohol use: Yes    Comment: rare glass of wine   Objective:   BP (!) 168/90 (BP Location: Left Arm, Patient Position: Sitting, Cuff Size: Normal)   Pulse 86  Temp 97.9 F (36.6 C) (Oral)   Resp 16   Wt 191 lb 6.4 oz (86.8 kg)   SpO2 99%   BMI 30.89 kg/m  Vitals:   03/28/18 0758  BP: (!) 168/90  Pulse: 86  Resp: 16  Temp: 97.9 F (36.6 C)  TempSrc: Oral  SpO2: 99%  Weight: 191 lb 6.4 oz (86.8 kg)     Physical Exam  Constitutional: She appears well-developed and well-nourished. No distress.  Neck: Normal range of motion. Neck supple.  Cardiovascular: Normal rate, regular rhythm and normal heart sounds.  Exam reveals no gallop and no friction rub.  No murmur heard. Pulmonary/Chest: Effort normal and breath sounds normal. No respiratory distress. She has no wheezes. She has no rales.  Musculoskeletal: She exhibits no edema.  Skin: She is not diaphoretic.  Vitals reviewed.       Assessment & Plan:     1. White coat syndrome without diagnosis of hypertension Stable BP on Maxzide 37.5-25mg . At home readings are normal.   2. Other seasonal allergic rhinitis Stable. Diagnosis pulled for medication refill. Continue current medical treatment plan. - fluticasone (FLONASE) 50 MCG/ACT nasal spray; Place 2 sprays into both nostrils daily.  Dispense: 48 g; Refill: 1  3. Hypercholesteremia Due for labs in 6 months. Stable otherwise. Continue healthy lifestyle modifications. Continue simvastatin 20mg .   4. BMI 30.0-30.9,adult Counseled patient on healthy lifestyle modifications including dieting and exercise.   5. Need for influenza vaccination Flu vaccine given today without complication. Patient sat upright for 15 minutes to check for adverse reaction before being released. - Flu Vaccine QUAD 36+ mos IM       Margaretann Loveless, PA-C  [facility]

## 2018-03-30 ENCOUNTER — Telehealth: Payer: Self-pay | Admitting: Physician Assistant

## 2018-03-30 DIAGNOSIS — J302 Other seasonal allergic rhinitis: Secondary | ICD-10-CM

## 2018-03-30 MED ORDER — FLUTICASONE PROPIONATE 50 MCG/ACT NA SUSP: 2.0000 | Freq: Every day | NASAL | 1 refills | Status: DC

## 2018-03-30 NOTE — Telephone Encounter (Signed)
Refilled

## 2018-03-30 NOTE — Telephone Encounter (Signed)
Pt's medication was sent to wrong pharmacy.  Pt needing her fluticasone (FLONASE) 50 MCG/ACT nasal spray [facility] into: [facility] [place] [place] - [id number] AT [address] [facility] [phone] (Phone) [phone] (Fax)    Thanks, [facility]

## 2018-05-05 DIAGNOSIS — K080 Exfoliation of teeth due to systemic causes: Secondary | ICD-10-CM | POA: Diagnosis not present

## 2018-09-22 ENCOUNTER — Other Ambulatory Visit: Payer: Self-pay | Admitting: Physician Assistant

## 2018-09-22 DIAGNOSIS — J302 Other seasonal allergic rhinitis: Secondary | ICD-10-CM

## 2018-09-30 ENCOUNTER — Other Ambulatory Visit: Payer: Self-pay

## 2018-09-30 ENCOUNTER — Encounter: Payer: Self-pay | Admitting: Physician Assistant

## 2018-09-30 ENCOUNTER — Ambulatory Visit (INDEPENDENT_AMBULATORY_CARE_PROVIDER_SITE_OTHER): Payer: Federal, State, Local not specified - PPO | Admitting: Physician Assistant

## 2018-09-30 VITALS — BP 149/83 | HR 88 | Temp 98.5°F | Resp 16 | Ht 66.0 in | Wt 191.2 lb

## 2018-09-30 DIAGNOSIS — R7303 Prediabetes: Secondary | ICD-10-CM

## 2018-09-30 DIAGNOSIS — Z23 Encounter for immunization: Secondary | ICD-10-CM

## 2018-09-30 DIAGNOSIS — E7800 Pure hypercholesterolemia, unspecified: Secondary | ICD-10-CM | POA: Diagnosis not present

## 2018-09-30 DIAGNOSIS — Z0000 Encounter for general adult medical examination without abnormal findings: Secondary | ICD-10-CM | POA: Diagnosis not present

## 2018-09-30 DIAGNOSIS — E559 Vitamin D deficiency, unspecified: Secondary | ICD-10-CM

## 2018-09-30 DIAGNOSIS — Z1239 Encounter for other screening for malignant neoplasm of breast: Secondary | ICD-10-CM | POA: Diagnosis not present

## 2018-09-30 DIAGNOSIS — I10 Essential (primary) hypertension: Secondary | ICD-10-CM | POA: Diagnosis not present

## 2018-09-30 DIAGNOSIS — E6609 Other obesity due to excess calories: Secondary | ICD-10-CM | POA: Diagnosis not present

## 2018-09-30 DIAGNOSIS — Z6830 Body mass index (BMI) 30.0-30.9, adult: Secondary | ICD-10-CM

## 2018-09-30 NOTE — Patient Instructions (Signed)
Health Maintenance for Postmenopausal Women Menopause is a normal process in which your reproductive ability comes to an end. This process happens gradually over a span of months to years, usually between the ages of 62 and 89. Menopause is complete when you have missed 12 consecutive menstrual periods. It is important to talk with your health care provider about some of the most common conditions that affect postmenopausal women, such as heart disease, cancer, and bone loss (osteoporosis). Adopting a healthy lifestyle and getting preventive care can help to promote your health and wellness. Those actions can also lower your chances of developing some of these common conditions. What should I know about menopause? During menopause, you may experience a number of symptoms, such as:  Moderate-to-severe hot flashes.  Night sweats.  Decrease in sex drive.  Mood swings.  Headaches.  Tiredness.  Irritability.  Memory problems.  Insomnia. Choosing to treat or not to treat menopausal changes is an individual decision that you make with your health care provider. What should I know about hormone replacement therapy and supplements? Hormone therapy products are effective for treating symptoms that are associated with menopause, such as hot flashes and night sweats. Hormone replacement carries certain risks, especially as you become older. If you are thinking about using estrogen or estrogen with progestin treatments, discuss the benefits and risks with your health care provider. What should I know about heart disease and stroke? Heart disease, heart attack, and stroke become more likely as you age. This may be due, in part, to the hormonal changes that your body experiences during menopause. These can affect how your body processes dietary fats, triglycerides, and cholesterol. Heart attack and stroke are both medical emergencies. There are many things that you can do to help prevent heart disease  and stroke:  Have your blood pressure checked at least every 1-2 years. High blood pressure causes heart disease and increases the risk of stroke.  If you are 79-72 years old, ask your health care provider if you should take aspirin to prevent a heart attack or a stroke.  Do not use any tobacco products, including cigarettes, chewing tobacco, or electronic cigarettes. If you need help quitting, ask your health care provider.  It is important to eat a healthy diet and maintain a healthy weight. ? Be sure to include plenty of vegetables, fruits, low-fat dairy products, and lean protein. ? Avoid eating foods that are high in solid fats, added sugars, or salt (sodium).  Get regular exercise. This is one of the most important things that you can do for your health. ? Try to exercise for at least 150 minutes each week. The type of exercise that you do should increase your heart rate and make you sweat. This is known as moderate-intensity exercise. ? Try to do strengthening exercises at least twice each week. Do these in addition to the moderate-intensity exercise.  Know your numbers.Ask your health care provider to check your cholesterol and your blood glucose. Continue to have your blood tested as directed by your health care provider.  What should I know about cancer screening? There are several types of cancer. Take the following steps to reduce your risk and to catch any cancer development as early as possible. Breast Cancer  Practice breast self-awareness. ? This means understanding how your breasts normally appear and feel. ? It also means doing regular breast self-exams. Let your health care provider know about any changes, no matter how small.  If you are 40 or  older, have a clinician do a breast exam (clinical breast exam or CBE) every year. Depending on your age, family history, and medical history, it may be recommended that you also have a yearly breast X-ray (mammogram).  If you  have a family history of breast cancer, talk with your health care provider about genetic screening.  If you are at high risk for breast cancer, talk with your health care provider about having an MRI and a mammogram every year.  Breast cancer (BRCA) gene test is recommended for women who have family members with BRCA-related cancers. Results of the assessment will determine the need for genetic counseling and BRCA1 and for BRCA2 testing. BRCA-related cancers include these types: ? Breast. This occurs in males or females. ? Ovarian. ? Tubal. This may also be called fallopian tube cancer. ? Cancer of the abdominal or pelvic lining (peritoneal cancer). ? Prostate. ? Pancreatic. Cervical, Uterine, and Ovarian Cancer Your health care provider may recommend that you be screened regularly for cancer of the pelvic organs. These include your ovaries, uterus, and vagina. This screening involves a pelvic exam, which includes checking for microscopic changes to the surface of your cervix (Pap test).  For women ages 21-65, health care providers may recommend a pelvic exam and a Pap test every three years. For women ages 39-65, they may recommend the Pap test and pelvic exam, combined with testing for human papilloma virus (HPV), every five years. Some types of HPV increase your risk of cervical cancer. Testing for HPV may also be done on women of any age who have unclear Pap test results.  Other health care providers may not recommend any screening for nonpregnant women who are considered low risk for pelvic cancer and have no symptoms. Ask your health care provider if a screening pelvic exam is right for you.  If you have had past treatment for cervical cancer or a condition that could lead to cancer, you need Pap tests and screening for cancer for at least 20 years after your treatment. If Pap tests have been discontinued for you, your risk factors (such as having a new sexual partner) need to be reassessed  to determine if you should start having screenings again. Some women have medical problems that increase the chance of getting cervical cancer. In these cases, your health care provider may recommend that you have screening and Pap tests more often.  If you have a family history of uterine cancer or ovarian cancer, talk with your health care provider about genetic screening.  If you have vaginal bleeding after reaching menopause, tell your health care provider.  There are currently no reliable tests available to screen for ovarian cancer. Lung Cancer Lung cancer screening is recommended for adults 57-50 years old who are at high risk for lung cancer because of a history of smoking. A yearly low-dose CT scan of the lungs is recommended if you:  Currently smoke.  Have a history of at least 30 pack-years of smoking and you currently smoke or have quit within the past 15 years. A pack-year is smoking an average of one pack of cigarettes per day for one year. Yearly screening should:  Continue until it has been 15 years since you quit.  Stop if you develop a health problem that would prevent you from having lung cancer treatment. Colorectal Cancer  This type of cancer can be detected and can often be prevented.  Routine colorectal cancer screening usually begins at age 12 and continues through  age 63.  If you have risk factors for colon cancer, your health care provider may recommend that you be screened at an earlier age.  If you have a family history of colorectal cancer, talk with your health care provider about genetic screening.  Your health care provider may also recommend using home test kits to check for hidden blood in your stool.  A small camera at the end of a tube can be used to examine your colon directly (sigmoidoscopy or colonoscopy). This is done to check for the earliest forms of colorectal cancer.  Direct examination of the colon should be repeated every 5-10 years until  age 75. However, if early forms of precancerous polyps or small growths are found or if you have a family history or genetic risk for colorectal cancer, you may need to be screened more often. Skin Cancer  Check your skin from head to toe regularly.  Monitor any moles. Be sure to tell your health care provider: ? About any new moles or changes in moles, especially if there is a change in a mole's shape or color. ? If you have a mole that is larger than the size of a pencil eraser.  If any of your family members has a history of skin cancer, especially at a young age, talk with your health care provider about genetic screening.  Always use sunscreen. Apply sunscreen liberally and repeatedly throughout the day.  Whenever you are outside, protect yourself by wearing long sleeves, pants, a wide-brimmed hat, and sunglasses. What should I know about osteoporosis? Osteoporosis is a condition in which bone destruction happens more quickly than new bone creation. After menopause, you may be at an increased risk for osteoporosis. To help prevent osteoporosis or the bone fractures that can happen because of osteoporosis, the following is recommended:  If you are 59-59 years old, get at least 1,000 mg of calcium and at least 600 mg of vitamin D per day.  If you are older than age 36 but younger than age 32, get at least 1,200 mg of calcium and at least 600 mg of vitamin D per day.  If you are older than age 47, get at least 1,200 mg of calcium and at least 800 mg of vitamin D per day. Smoking and excessive alcohol intake increase the risk of osteoporosis. Eat foods that are rich in calcium and vitamin D, and do weight-bearing exercises several times each week as directed by your health care provider. What should I know about how menopause affects my mental health? Depression may occur at any age, but it is more common as you become older. Common symptoms of depression include:  Low or sad mood.   Changes in sleep patterns.  Changes in appetite or eating patterns.  Feeling an overall lack of motivation or enjoyment of activities that you previously enjoyed.  Frequent crying spells. Talk with your health care provider if you think that you are experiencing depression. What should I know about immunizations? It is important that you get and maintain your immunizations. These include:  Tetanus, diphtheria, and pertussis (Tdap) booster vaccine.  Influenza every year before the flu season begins.  Pneumonia vaccine.  Shingles vaccine. Your health care provider may also recommend other immunizations. This information is not intended to replace advice given to you by your health care provider. Make sure you discuss any questions you have with your health care provider. Document Released: 06/26/2005 Document Revised: 11/22/2015 Document Reviewed: 02/05/2015 Elsevier Interactive Patient Education  2019 [place].

## 2018-09-30 NOTE — Progress Notes (Signed)
Patient: Krista Cooper, Female    DOB: 01-Apr-1956, 63 y.o.   MRN: [id number] Visit Date: 09/30/2018  Today's Provider: Margaretann Loveless, PA-C   Chief Complaint  Patient presents with  . Annual Exam   Subjective:     Annual physical exam Krista Cooper is a 63 y.o. female who presents today for health maintenance and complete physical. She feels well. She reports exercising. She reports she is sleeping well. ----------------------------------------------------------------- Pap:09/23/2016 Normal BP:120's/70'sreadings at home. Mammogram: 12/17/2017 Normal  Review of Systems  Constitutional: Negative.   HENT: Positive for congestion and sneezing.   Eyes: Negative.   Respiratory: Negative.   Cardiovascular: Negative.   Gastrointestinal: Negative.   Endocrine: Negative.   Genitourinary: Negative.   Musculoskeletal: Positive for myalgias.  Skin: Negative.   Allergic/Immunologic: Negative.   Neurological: Negative.   Hematological: Negative.   Psychiatric/Behavioral: Negative.     Social History      She  reports that she has never smoked. She has never used smokeless tobacco. She reports current alcohol use. She reports that she does not use drugs.       Social History   Socioeconomic History  . Marital status: Married    Spouse name: Not on file  . Number of children: Not on file  . Years of education: Not on file  . Highest education level: Not on file  Occupational History  . 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 Financial resource strain: Not on file  . Food insecurity:    Worry: Not on file    Inability: Not on file  . Transportation needs:    Medical: Not on file    Non-medical: Not on file  Tobacco Use  . Smoking status: Never Smoker  . Smokeless tobacco: Never Used  Substance and Sexual Activity  . Alcohol use: Yes    Comment: rare glass of wine  . Drug use: No  . Sexual activity: Not on file   Lifestyle  . Physical activity:    Days per week: Not on file    Minutes per session: Not on file  . Stress: Not on file  Relationships  . Social connections:    Talks on phone: Not on file    Gets together: Not on file    Attends religious service: Not on file    Active member of club or organization: Not on file    Attends meetings of clubs or organizations: Not on file    Relationship status: Not on file  Other Topics Concern  . Not on file  Social History Narrative  . Not on file    Past Medical History:  Diagnosis Date  . Allergy   . Anxiety   . Hyperlipidemia   . Hypertension      Patient Active Problem List   Diagnosis Date Noted  . White coat syndrome without diagnosis of hypertension 03/26/2017  . 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 Allergic rhinitis 02/14/2015  . Anxiety 02/14/2015  . Hypercholesteremia 02/14/2015  . BP (high blood pressure) 02/14/2015  . Cannot sleep 02/14/2015  . Borderline diabetes 02/14/2015  . Avitaminosis D 02/14/2015  . Hyperlipemia 12/25/2014    Past Surgical History:  Procedure Laterality Date  . CESAREAN SECTION    . FRACTURE SURGERY Right 1997   PLATES AND PINS  . MOUTH SURGERY  1976   WISDOM TEETH REMOVED    Family History  Family Status  Relation Name Status  . Mother  Deceased  . Father  Deceased at age 39       LUNG CANCER  . PGF  Deceased       MI  . Daughter  Deceased       MVA  . MGM  (Not Specified)        Her family history includes COPD in her mother; CVA in her maternal grandmother; Cancer in her father; Congestive Heart Failure in her father; Diabetes in her mother and paternal grandfather; Hyperlipidemia in her mother; Hypertension in her father and mother.      Allergies  Allergen Reactions  . Codeine     Itching; Can take Cough Syrup  . Lisinopril Cough     Current Outpatient Medications:  .  aspirin 81 MG tablet, Take 1 tablet by mouth daily., Disp: , Rfl:  .  B Complex Vitamins  (VITAMIN B COMPLEX PO), Take 1 tablet by mouth daily., Disp: , Rfl:  .  Cholecalciferol 10000 UNITS CAPS, Take 2 capsules by mouth daily. , Disp: , Rfl:  .  fluticasone (FLONASE) 50 MCG/ACT nasal spray, Place 2 sprays into both nostrils daily., Disp: 48 g, Rfl: 1 .  levocetirizine (XYZAL) 5 MG tablet, TAKE 1 TABLET BY MOUTH EVERY EVENING, Disp: 90 tablet, Rfl: 1 .  MULTIPLE VITAMIN PO, Take 1 tablet by mouth daily., Disp: , Rfl:  .  omega-3 fish oil (MAXEPA) 1000 MG CAPS capsule, Take 1 capsule by mouth daily., Disp: , Rfl:  .  simvastatin (ZOCOR) 20 MG tablet, Take 1 tablet (20 mg total) by mouth at bedtime., Disp: 90 tablet, Rfl: 3 .  triamterene-hydrochlorothiazide (MAXZIDE-25) 37.5-25 MG tablet, Take 1 tablet by mouth daily., Disp: 90 tablet, Rfl: 3   Patient Care Team: Margaretann Loveless, PA-C as PCP - General (Family Medicine)    Objective:    Vitals: BP (!) 149/83 (BP Location: Left Arm, Patient Position: Sitting, Cuff Size: Large)   Pulse 88   Temp 98.5 F (36.9 C) (Oral)   Resp 16   Ht  (1.676 m)   Wt 191 lb 3.2 oz (86.7 kg)   BMI 30.86 kg/m    Vitals:   09/30/18 0918  BP: (!) 149/83  Pulse: 88  Resp: 16  Temp: 98.5 F (36.9 C)  TempSrc: Oral  Weight: 191 lb 3.2 oz (86.7 kg)  Height:  (1.676 m)     Physical Exam Vitals signs reviewed.  Constitutional:      General: She is not in acute distress.    Appearance: Normal appearance. She is well-developed. She is obese. She is not ill-appearing or diaphoretic.  HENT:     Head: Normocephalic and atraumatic.     Right Ear: Tympanic membrane, ear canal and external ear normal.     Left Ear: Tympanic membrane, ear canal and external ear normal.     Nose: Nose normal. No congestion.     Mouth/Throat:     Mouth: Mucous membranes are moist.     Pharynx: Oropharynx is clear. No oropharyngeal exudate.  Eyes:     General: No scleral icterus.       Right eye: No discharge.        Left eye: No discharge.      Extraocular Movements: Extraocular movements intact.     Conjunctiva/sclera: Conjunctivae normal.     Pupils: Pupils are equal, round, and reactive to light.  Neck:     Musculoskeletal: Normal  range of motion and neck supple.     Thyroid: No thyromegaly.     Vascular: No JVD.     Trachea: No tracheal deviation.  Cardiovascular:     Rate and Rhythm: Normal rate and regular rhythm.     Pulses: Normal pulses.     Heart sounds: Normal heart sounds. No murmur. No friction rub. No gallop.   Pulmonary:     Effort: Pulmonary effort is normal. No respiratory distress.     Breath sounds: Normal breath sounds. No wheezing or rales.  Chest:     Chest wall: No tenderness.     Breasts:        Right: Skin change present. No swelling, inverted nipple, mass, nipple discharge or tenderness.        Left: No swelling, inverted nipple, mass, nipple discharge, skin change or tenderness.    Abdominal:     General: Bowel sounds are normal. There is no distension.     Palpations: Abdomen is soft. There is no mass.     Tenderness: There is no abdominal tenderness. There is no guarding or rebound.  Musculoskeletal: Normal range of motion.        General: No tenderness.  Lymphadenopathy:     Cervical: No cervical adenopathy.     Upper Body:     Right upper body: No supraclavicular, axillary or pectoral adenopathy.     Left upper body: No supraclavicular, axillary or pectoral adenopathy.  Skin:    General: Skin is warm and dry.     Capillary Refill: Capillary refill takes less than 2 seconds.     Findings: No rash.  Neurological:     General: No focal deficit present.     Mental Status: She is alert and oriented to person, place, and time. Mental status is at baseline.  Psychiatric:        Mood and Affect: Mood normal.        Behavior: Behavior normal.        Thought Content: Thought content normal.        Judgment: Judgment normal.      Depression Screen PHQ 2/9 Scores 09/30/2018 09/24/2017  09/23/2016 09/16/2015  PHQ - 2 Score 0 0 0 0  PHQ- 9 Score - 0 0 -       Assessment & Plan:     Routine Health Maintenance and Physical Exam  Exercise Activities and Dietary recommendations Goals   None     Immunization History  Administered Date(s) Administered  . Influenza,inj,Quad PF,6+ Mos 02/02/2014, 02/15/2015, 03/19/2016, 03/28/2018  . Influenza-Unspecified 03/16/2017  . Td 03/19/2016  . Tdap 10/15/2005    Health Maintenance  Topic Date Due  . INFLUENZA VACCINE  12/17/2018  . PAP SMEAR-Modifier  09/24/2019  . Fecal DNA (Cologuard)  10/16/2019  . MAMMOGRAM  12/18/2019  . TETANUS/TDAP  03/19/2026  . Hepatitis C Screening  Completed  . HIV Screening  Completed     Discussed health benefits of physical activity, and encouraged her to engage in regular exercise appropriate for her age and condition.    1. Annual physical exam Normal physical exam today. Will check labs as below and f/u pending lab results. If labs are stable and WNL she will not need to have these rechecked for one year at her next annual physical exam. She is to call the office in the meantime if she has any acute issue, questions or concerns. - CBC with Differential/Platelet - Comprehensive metabolic panel - Hemoglobin A1c -  Lipid panel - TSH - Vitamin D (25 hydroxy)  2. Breast cancer screening Breast exam today was normal. There is no family history of breast cancer. She does perform regular self breast exams. Mammogram was ordered as below. Information for [facility] was given to patient so she may schedule her mammogram at her convenience. - MM 3D SCREEN BREAST BILATERAL; Future  3. Essential hypertension Stable. Continue Maxzide 37.5-25mg . Will check labs as below and f/u pending results. - CBC with Differential/Platelet - Comprehensive metabolic panel - Hemoglobin A1c - Lipid panel - TSH  4. Avitaminosis D H/O this and postmenopausal. Will check labs as below and f/u  pending results. - CBC with Differential/Platelet - Comprehensive metabolic panel - Hemoglobin A1c - Lipid panel - TSH - Vitamin D (25 hydroxy)  5. Borderline diabetes Diet controlled. Will check labs as below and f/u pending results. - CBC with Differential/Platelet - Comprehensive metabolic panel - Hemoglobin A1c - Lipid panel - TSH  6. Hypercholesteremia Stable. Continue simvastatin 20mg  daily. Will check labs as below and f/u pending results. - CBC with Differential/Platelet - Comprehensive metabolic panel - Hemoglobin A1c - Lipid panel - TSH  7. Class 1 obesity due to excess calories with serious comorbidity and body mass index (BMI) of 30.0 to 30.9 in adult Counseled patient on healthy lifestyle modifications including dieting and exercise.  - CBC with Differential/Platelet - Comprehensive metabolic panel - Hemoglobin A1c - Lipid panel - TSH  8. Need for shingles vaccine Shingrix #1 Vaccine given to patient without complications. Patient sat for 15 minutes after administration and was tolerated well without adverse effects. Return in 2 months for next injection.  - Varicella-zoster vaccine IM (Shingrix)  --------------------------------------------------------------------    Margaretann Loveless, PA-C  [facility]

## 2018-10-01 LAB — COMPREHENSIVE METABOLIC PANEL
ALT: 33 IU/L — ABNORMAL HIGH (ref 0–32)
AST: 42 IU/L — ABNORMAL HIGH (ref 0–40)
Albumin/Globulin Ratio: 2.8 — ABNORMAL HIGH (ref 1.2–2.2)
Albumin: 4.8 g/dL (ref 3.8–4.8)
Alkaline Phosphatase: 63 IU/L (ref 39–117)
BUN/Creatinine Ratio: 17 (ref 12–28)
BUN: 11 mg/dL (ref 8–27)
Bilirubin Total: 0.7 mg/dL (ref 0.0–1.2)
CO2: 24 mmol/L (ref 20–29)
Calcium: 9.9 mg/dL (ref 8.7–10.3)
Chloride: 100 mmol/L (ref 96–106)
Creatinine, Ser: 0.64 mg/dL (ref 0.57–1.00)
GFR calc Af Amer: 111 mL/min/{1.73_m2} (ref >59)
GFR calc non Af Amer: 96 mL/min/{1.73_m2} (ref >59)
Globulin, Total: 1.7 g/dL (ref 1.5–4.5)
Glucose: 106 mg/dL — ABNORMAL HIGH (ref 65–99)
Potassium: 3.7 mmol/L (ref 3.5–5.2)
Sodium: 140 mmol/L (ref 134–144)
Total Protein: 6.5 g/dL (ref 6.0–8.5)

## 2018-10-01 LAB — HEMOGLOBIN A1C
Est. average glucose Bld gHb Est-mCnc: 128 mg/dL
Hgb A1c MFr Bld: 6.1 % — ABNORMAL HIGH (ref 4.8–5.6)

## 2018-10-01 LAB — LIPID PANEL
Chol/HDL Ratio: 2.4 ratio (ref 0.0–4.4)
Cholesterol, Total: 195 mg/dL (ref 100–199)
HDL: 81 mg/dL (ref >39)
LDL Calculated: 101 mg/dL — ABNORMAL HIGH (ref 0–99)
Triglycerides: 65 mg/dL (ref 0–149)
VLDL Cholesterol Cal: 13 mg/dL (ref 5–40)

## 2018-10-01 LAB — CBC WITH DIFFERENTIAL/PLATELET
Basophils Absolute: 0 10*3/uL (ref 0.0–0.2)
Basos: 0 %
EOS (ABSOLUTE): 0.1 10*3/uL (ref 0.0–0.4)
Eos: 1 %
Hematocrit: 38.5 % (ref 34.0–46.6)
Hemoglobin: 13.4 g/dL (ref 11.1–15.9)
Immature Grans (Abs): 0 10*3/uL (ref 0.0–0.1)
Immature Granulocytes: 0 %
Lymphocytes Absolute: 1.3 10*3/uL (ref 0.7–3.1)
Lymphs: 29 %
MCH: 29.8 pg (ref 26.6–33.0)
MCHC: 34.8 g/dL (ref 31.5–35.7)
MCV: 86 fL (ref 79–97)
Monocytes Absolute: 0.3 10*3/uL (ref 0.1–0.9)
Monocytes: 8 %
Neutrophils Absolute: 2.7 10*3/uL (ref 1.4–7.0)
Neutrophils: 62 %
Platelets: 318 10*3/uL (ref 150–450)
RBC: 4.49 x10E6/uL (ref 3.77–5.28)
RDW: 13.2 % (ref 11.7–15.4)
WBC: 4.4 10*3/uL (ref 3.4–10.8)

## 2018-10-01 LAB — TSH: TSH: 3.09 u[IU]/mL (ref 0.450–4.500)

## 2018-10-01 LAB — VITAMIN D 25 HYDROXY (VIT D DEFICIENCY, FRACTURES): Vit D, 25-Hydroxy: 38.6 ng/mL (ref 30.0–100.0)

## 2018-10-12 ENCOUNTER — Telehealth: Payer: Self-pay | Admitting: *Deleted

## 2018-10-12 NOTE — Telephone Encounter (Signed)
Patient was advised by Vernona Rieger earlier when patient call regarding her labs result.

## 2018-10-12 NOTE — Telephone Encounter (Signed)
-----   Message from Margaretann Loveless, [place] sent at 10/11/2018  1:20 PM EDT ----- Blood count is normal. Kidney function normal. Liver enzymes up just slightly. Recommend to limit tylenol (acetaminophen) based products, alcohol and fatty foods. We can recheck in 6-8 weeks. A1c also up slightly from last check to 6.1 from 6.0. Cholesterol slightly improved. Thyroid is normal. Vit D is normal.

## 2018-10-12 NOTE — Telephone Encounter (Signed)
LMOVM for pt to return call 

## 2018-11-11 ENCOUNTER — Other Ambulatory Visit: Payer: Self-pay | Admitting: Physician Assistant

## 2018-11-11 DIAGNOSIS — I10 Essential (primary) hypertension: Secondary | ICD-10-CM

## 2018-11-11 NOTE — Telephone Encounter (Signed)
Patient requesting refill on the following medication to be send to [facility]. triamterene-hydrochlorothiazide (MAXZIDE-25) 37.5-25 MG tablet

## 2018-12-06 ENCOUNTER — Other Ambulatory Visit: Payer: Self-pay | Admitting: Physician Assistant

## 2018-12-06 DIAGNOSIS — E7800 Pure hypercholesterolemia, unspecified: Secondary | ICD-10-CM

## 2018-12-07 ENCOUNTER — Ambulatory Visit: Payer: Federal, State, Local not specified - PPO | Admitting: Physician Assistant

## 2018-12-12 ENCOUNTER — Encounter: Payer: Self-pay | Admitting: Physician Assistant

## 2018-12-12 ENCOUNTER — Other Ambulatory Visit: Payer: Self-pay

## 2018-12-12 ENCOUNTER — Ambulatory Visit (INDEPENDENT_AMBULATORY_CARE_PROVIDER_SITE_OTHER): Payer: Federal, State, Local not specified - PPO | Admitting: Physician Assistant

## 2018-12-12 DIAGNOSIS — Z23 Encounter for immunization: Secondary | ICD-10-CM

## 2018-12-12 DIAGNOSIS — R945 Abnormal results of liver function studies: Secondary | ICD-10-CM

## 2018-12-12 DIAGNOSIS — R7989 Other specified abnormal findings of blood chemistry: Secondary | ICD-10-CM

## 2018-12-12 NOTE — Progress Notes (Signed)
Shingrix Vaccine #2 given to patient without complications. Patient sat for 15 minutes after administration and was tolerated well without adverse effects.  Patient also needing to recheck hepatic function. Labs ordered and will f/u pending results.

## 2018-12-13 ENCOUNTER — Telehealth: Payer: Self-pay

## 2018-12-13 LAB — COMPREHENSIVE METABOLIC PANEL
ALT: 27 IU/L (ref 0–32)
AST: 29 IU/L (ref 0–40)
Albumin/Globulin Ratio: 2.7 — ABNORMAL HIGH (ref 1.2–2.2)
Albumin: 4.6 g/dL (ref 3.8–4.8)
Alkaline Phosphatase: 75 IU/L (ref 39–117)
BUN/Creatinine Ratio: 28 (ref 12–28)
BUN: 16 mg/dL (ref 8–27)
Bilirubin Total: 0.5 mg/dL (ref 0.0–1.2)
CO2: 24 mmol/L (ref 20–29)
Calcium: 9.7 mg/dL (ref 8.7–10.3)
Chloride: 98 mmol/L (ref 96–106)
Creatinine, Ser: 0.58 mg/dL (ref 0.57–1.00)
GFR calc Af Amer: 113 mL/min/{1.73_m2} (ref >59)
GFR calc non Af Amer: 98 mL/min/{1.73_m2} (ref >59)
Globulin, Total: 1.7 g/dL (ref 1.5–4.5)
Glucose: 101 mg/dL — ABNORMAL HIGH (ref 65–99)
Potassium: 4 mmol/L (ref 3.5–5.2)
Sodium: 138 mmol/L (ref 134–144)
Total Protein: 6.3 g/dL (ref 6.0–8.5)

## 2018-12-13 NOTE — Telephone Encounter (Signed)
Patient advised as directed below. 

## 2018-12-13 NOTE — Telephone Encounter (Signed)
-----   Message from Mar Daring, [place] sent at 12/13/2018  1:13 PM EDT ----- Liver enzymes now back to normal

## 2018-12-20 ENCOUNTER — Ambulatory Visit
Admission: RE | Admit: 2018-12-20 | Discharge: 2018-12-20 | Disposition: A | Discharge status: Home / Self Care | Payer: Federal, State, Local not specified - PPO | Source: Ambulatory Visit | Attending: Physician Assistant | Admitting: Physician Assistant

## 2018-12-20 ENCOUNTER — Telehealth: Payer: Self-pay

## 2018-12-20 ENCOUNTER — Other Ambulatory Visit: Payer: Self-pay

## 2018-12-20 DIAGNOSIS — Z1231 Encounter for screening mammogram for malignant neoplasm of breast: Secondary | ICD-10-CM | POA: Insufficient documentation

## 2018-12-20 DIAGNOSIS — Z1239 Encounter for other screening for malignant neoplasm of breast: Secondary | ICD-10-CM

## 2018-12-20 NOTE — Telephone Encounter (Signed)
-----   Message from Mar Daring, [place] sent at 12/20/2018  3:22 PM EDT ----- Normal mammogram. Repeat screening in one year.

## 2018-12-20 NOTE — Telephone Encounter (Signed)
Patient advised as directed below. 

## 2018-12-20 NOTE — Telephone Encounter (Signed)
Pt returned a missed call.  Please return pt call.  Thanks, [employer]

## 2018-12-20 NOTE — Telephone Encounter (Signed)
LVMTRC 

## 2019-02-08 ENCOUNTER — Telehealth: Payer: Self-pay | Admitting: Physician Assistant

## 2019-02-08 DIAGNOSIS — I10 Essential (primary) hypertension: Secondary | ICD-10-CM

## 2019-02-08 MED ORDER — TRIAMTERENE-HCTZ 37.5-25 MG PO TABS: 1.0000 | ORAL_TABLET | Freq: Every day | ORAL | 1 refills | Status: DC

## 2019-02-08 NOTE — Telephone Encounter (Signed)
Pt called needing refill on Maxzide -[place]

## 2019-03-23 ENCOUNTER — Other Ambulatory Visit: Payer: Self-pay | Admitting: Physician Assistant

## 2019-03-23 DIAGNOSIS — J302 Other seasonal allergic rhinitis: Secondary | ICD-10-CM

## 2019-03-23 MED ORDER — LEVOCETIRIZINE DIHYDROCHLORIDE 5 MG PO TABS: 5.0000 mg | ORAL_TABLET | Freq: Every evening | ORAL | 1 refills | Status: DC

## 2019-03-23 NOTE — Telephone Encounter (Signed)
Pt needing a 90 day refill on:  levocetirizine (XYZAL) 5 MG tablet  Please fill at:  [facility] [address], [place] [phone] (Phone) [phone] (Fax)   Thanks, [facility]

## 2019-03-31 NOTE — Progress Notes (Signed)
Patient: Krista Cooper Female    DOB: [birth year]   63 y.o.   MRN: [id number] Visit Date: 04/03/2019  Today's Provider: Mar Daring, PA-C   Chief Complaint  Patient presents with  . Follow-up  . Hypertension  . Hyperlipidemia   Subjective:     HPI   Essential hypertension: Stable. Continue Maxzide 37.5-25mg . Stable. Her BP readings have been in the 120's/70's at home. She is exercising. She is adherent to low salt diet. She is no longer having a daily headache.  BP Readings from Last 3 Encounters:  04/03/19 140/80  09/30/18 (!) 149/83  03/28/18 (!) 168/90    Hypercholesteremia:Stable. Continue simvastatin 20mg  daily. Stable. No symptoms.  Allergies  Allergen Reactions  . Codeine     Itching; Can take Cough Syrup  . Lisinopril Cough     Current Outpatient Medications:  .  aspirin 81 MG tablet, Take 1 tablet by mouth daily., Disp: , Rfl:  .  B Complex Vitamins (VITAMIN B COMPLEX PO), Take 1 tablet by mouth daily., Disp: , Rfl:  .  Cholecalciferol 10000 UNITS CAPS, Take 2 capsules by mouth daily. , Disp: , Rfl:  .  fluticasone (FLONASE) 50 MCG/ACT nasal spray, Place 2 sprays into both nostrils daily., Disp: 48 g, Rfl: 1 .  levocetirizine (XYZAL) 5 MG tablet, Take 1 tablet (5 mg total) by mouth every evening., Disp: 90 tablet, Rfl: 1 .  MULTIPLE VITAMIN PO, Take 1 tablet by mouth daily., Disp: , Rfl:  .  omega-3 fish oil (MAXEPA) 1000 MG CAPS capsule, Take 1 capsule by mouth daily., Disp: , Rfl:  .  simvastatin (ZOCOR) 20 MG tablet, TAKE 1 TABLET BY MOUTH AT BEDTIME, Disp: 90 tablet, Rfl: 1 .  triamterene-hydrochlorothiazide (MAXZIDE-25) 37.5-25 MG tablet, Take 1 tablet by mouth daily., Disp: 90 tablet, Rfl: 1  Review of Systems  Constitutional: Negative for appetite change, chills, fatigue and fever.  Respiratory: Negative for chest tightness and shortness of breath.   Cardiovascular: Negative for chest pain and palpitations.  Gastrointestinal:  Negative for abdominal pain, nausea and vomiting.  Neurological: Negative for dizziness and weakness.    Social History   Tobacco Use  . Smoking status: Never Smoker  . Smokeless tobacco: Never Used  Substance Use Topics  . Alcohol use: Yes    Comment: rare glass of wine      Objective:   BP 140/80 (BP Location: Right Arm, Patient Position: Sitting, Cuff Size: Large)   Pulse 70   Temp (!) 97.2 F (36.2 C) (Other (Comment))   Resp 16  Vitals:   04/03/19 1050  BP: 140/80  Pulse: 70  Resp: 16  Temp: (!) 97.2 F (36.2 C)  TempSrc: Other (Comment)  There is no height or weight on file to calculate BMI.   Physical Exam Vitals signs reviewed.  Constitutional:      General: She is not in acute distress.    Appearance: Normal appearance. She is well-developed. She is not diaphoretic.  Neck:     Musculoskeletal: Normal range of motion and neck supple.  Cardiovascular:     Rate and Rhythm: Normal rate and regular rhythm.     Heart sounds: Normal heart sounds. No murmur. No friction rub. No gallop.   Pulmonary:     Effort: Pulmonary effort is normal. No respiratory distress.     Breath sounds: Normal breath sounds. No wheezing or rales.  Neurological:     Mental Status: She is alert.  No results found for any visits on 04/03/19.     Assessment & Plan    1. Essential hypertension Elevated today, but patient gets White Coat HTN. Home readings were in the 110-120s/70s. Continue Maxzide 37.5-25mg . I will see her back in 6 months for her CPE. Call if acute issue develops.   2. White coat syndrome with diagnosis of hypertension See above medical treatment plan.  3. Other seasonal allergic rhinitis Stable. Diagnosis pulled for medication refill. Continue current medical treatment plan. - fluticasone (FLONASE) 50 MCG/ACT nasal spray; Place 2 sprays into both nostrils daily.  Dispense: 48 g; Refill: 1  4. Need for influenza vaccination Flu vaccine given today without  complication. Patient sat upright for 15 minutes to check for adverse reaction before being released. - Flu Vaccine QUAD 36+ mos PF IM (Fluarix & Fluzone Quad PF)     Margaretann Loveless, PA-C  [facility]

## 2019-04-03 ENCOUNTER — Other Ambulatory Visit: Payer: Self-pay

## 2019-04-03 ENCOUNTER — Ambulatory Visit: Payer: Federal, State, Local not specified - PPO | Admitting: Physician Assistant

## 2019-04-03 ENCOUNTER — Encounter: Payer: Self-pay | Admitting: Physician Assistant

## 2019-04-03 VITALS — BP 140/80 | HR 70 | Temp 97.2°F | Resp 16

## 2019-04-03 DIAGNOSIS — I10 Essential (primary) hypertension: Secondary | ICD-10-CM

## 2019-04-03 DIAGNOSIS — J302 Other seasonal allergic rhinitis: Secondary | ICD-10-CM | POA: Diagnosis not present

## 2019-04-03 DIAGNOSIS — Z23 Encounter for immunization: Secondary | ICD-10-CM

## 2019-04-03 MED ORDER — FLUTICASONE PROPIONATE 50 MCG/ACT NA SUSP: 2.0000 | Freq: Every day | NASAL | 1 refills | Status: DC

## 2019-04-03 NOTE — Patient Instructions (Signed)

## 2019-06-05 ENCOUNTER — Other Ambulatory Visit: Payer: Self-pay | Admitting: Physician Assistant

## 2019-06-05 DIAGNOSIS — E7800 Pure hypercholesterolemia, unspecified: Secondary | ICD-10-CM

## 2019-06-05 DIAGNOSIS — I10 Essential (primary) hypertension: Secondary | ICD-10-CM

## 2019-06-05 NOTE — Telephone Encounter (Signed)
Requested Prescriptions  Pending Prescriptions Disp Refills  . triamterene-hydrochlorothiazide (MAXZIDE-25) 37.5-25 MG tablet [Pharmacy Med Name: Triamterene-HCTZ 37.5-25 MG Oral Tablet] 90 tablet 0    Sig: Take 1 tablet by mouth once daily     Cardiovascular: Diuretic Combos Failed - 06/05/2019  9:22 AM      Failed - Last BP in normal range    BP Readings from Last 1 Encounters:  04/03/19 140/80         Passed - K in normal range and within 360 days    Potassium  Date Value Ref Range Status  12/12/2018 4.0 3.5 - 5.2 mmol/L Final         Passed - Na in normal range and within 360 days    Sodium  Date Value Ref Range Status  12/12/2018 138 134 - 144 mmol/L Final         Passed - Cr in normal range and within 360 days    Creatinine, Ser  Date Value Ref Range Status  12/12/2018 0.58 0.57 - 1.00 mg/dL Final         Passed - Ca in normal range and within 360 days    Calcium  Date Value Ref Range Status  12/12/2018 9.7 8.7 - 10.3 mg/dL Final         Passed - Valid encounter within last 6 months    Recent Outpatient Visits          2 months ago Essential hypertension   [facility] Fenton Malling M, PA-C   8 months ago Annual physical exam   [place], [place], [place]   1 year ago White coat syndrome without diagnosis of hypertension   [facility], [place], [place]   1 year ago Annual physical exam   [facility] Fenton Malling M, [place]   2 years ago Borderline diabetes   [facility] [place], Jennifer M, [place]             . simvastatin (ZOCOR) 20 MG tablet [Pharmacy Med Name: Simvastatin 20 MG Oral Tablet] 90 tablet 0    Sig: TAKE 1 TABLET BY MOUTH AT BEDTIME     Cardiovascular:  Antilipid - Statins Failed - 06/05/2019  9:22 AM      Failed - LDL in normal range and within 360 days    LDL Calculated  Date Value Ref Range Status  09/30/2018 101 (H) 0 - 99 mg/dL  Final         Passed - Total Cholesterol in normal range and within 360 days    Cholesterol, Total  Date Value Ref Range Status  09/30/2018 195 100 - 199 mg/dL Final         Passed - HDL in normal range and within 360 days    HDL  Date Value Ref Range Status  09/30/2018 81 >39 mg/dL Final         Passed - Triglycerides in normal range and within 360 days    Triglycerides  Date Value Ref Range Status  09/30/2018 65 0 - 149 mg/dL Final         Passed - Patient is not pregnant      Passed - Valid encounter within last 12 months    Recent Outpatient Visits          2 months ago Essential hypertension   [facility] Fenton Malling M, PA-C   8 months ago Annual physical exam   [facility] [place], [name], [place]   1 year ago White coat syndrome without diagnosis of hypertension   [facility], [name], [place]   1 year ago Annual physical exam   [facility] [place], [name], [place]   2 years ago Borderline diabetes   [facility] [place], [place], [place]

## 2019-09-01 ENCOUNTER — Telehealth: Payer: Self-pay | Admitting: Physician Assistant

## 2019-09-01 DIAGNOSIS — J302 Other seasonal allergic rhinitis: Secondary | ICD-10-CM

## 2019-09-01 DIAGNOSIS — E7800 Pure hypercholesterolemia, unspecified: Secondary | ICD-10-CM

## 2019-09-01 MED ORDER — LEVOCETIRIZINE DIHYDROCHLORIDE 5 MG PO TABS: 5.0000 mg | ORAL_TABLET | Freq: Every evening | ORAL | 1 refills | Status: DC

## 2019-09-01 MED ORDER — SIMVASTATIN 20 MG PO TABS: 20.0000 mg | ORAL_TABLET | Freq: Every day | ORAL | 0 refills | Status: DC

## 2019-09-01 NOTE — Telephone Encounter (Signed)
Patient requesting simvastatin (ZOCOR) 20 MG tablet and levocetirizine (XYZAL) 5 MG tablet, informed please allow 48 to 72 hour turn around   [facility] [address] [place] - [address] Phone:  [phone]  Fax:  [phone]

## 2019-10-04 ENCOUNTER — Encounter: Payer: Self-pay | Admitting: Physician Assistant

## 2019-10-13 ENCOUNTER — Other Ambulatory Visit (HOSPITAL_COMMUNITY)
Admission: RE | Admit: 2019-10-13 | Discharge: 2019-10-13 | Disposition: A | Discharge status: Home / Self Care | Payer: Federal, State, Local not specified - PPO | Source: Ambulatory Visit | Attending: Physician Assistant | Admitting: Physician Assistant

## 2019-10-13 ENCOUNTER — Ambulatory Visit (INDEPENDENT_AMBULATORY_CARE_PROVIDER_SITE_OTHER): Payer: Federal, State, Local not specified - PPO | Admitting: Physician Assistant

## 2019-10-13 ENCOUNTER — Other Ambulatory Visit: Payer: Self-pay

## 2019-10-13 ENCOUNTER — Encounter: Payer: Self-pay | Admitting: Physician Assistant

## 2019-10-13 VITALS — BP 146/84 | HR 94 | Temp 97.0°F | Resp 16 | Ht 66.0 in | Wt 180.6 lb

## 2019-10-13 DIAGNOSIS — R7303 Prediabetes: Secondary | ICD-10-CM | POA: Diagnosis not present

## 2019-10-13 DIAGNOSIS — E7800 Pure hypercholesterolemia, unspecified: Secondary | ICD-10-CM

## 2019-10-13 DIAGNOSIS — Z1329 Encounter for screening for other suspected endocrine disorder: Secondary | ICD-10-CM

## 2019-10-13 DIAGNOSIS — E559 Vitamin D deficiency, unspecified: Secondary | ICD-10-CM

## 2019-10-13 DIAGNOSIS — Z0000 Encounter for general adult medical examination without abnormal findings: Secondary | ICD-10-CM | POA: Diagnosis not present

## 2019-10-13 DIAGNOSIS — Z1239 Encounter for other screening for malignant neoplasm of breast: Secondary | ICD-10-CM | POA: Diagnosis not present

## 2019-10-13 DIAGNOSIS — Z124 Encounter for screening for malignant neoplasm of cervix: Secondary | ICD-10-CM

## 2019-10-13 DIAGNOSIS — Z1211 Encounter for screening for malignant neoplasm of colon: Secondary | ICD-10-CM

## 2019-10-13 DIAGNOSIS — I10 Essential (primary) hypertension: Secondary | ICD-10-CM | POA: Diagnosis not present

## 2019-10-13 NOTE — Patient Instructions (Signed)
[facility] [address] [place],  [place]  [address] Get Driving Directions Main: [phone]   Health Maintenance for Postmenopausal Women Menopause is a normal process in which your ability to get pregnant comes to an end. This process happens slowly over many months or years, usually between the ages of 48 and 55. Menopause is complete when you have missed your menstrual periods for 12 months. It is important to talk with your health care provider about some of the most common conditions that affect women after menopause (postmenopausal women). These include heart disease, cancer, and bone loss (osteoporosis). Adopting a healthy lifestyle and getting preventive care can help to promote your health and wellness. The actions you take can also lower your chances of developing some of these common conditions. What should I know about menopause? During menopause, you may get a number of symptoms, such as:  Hot flashes. These can be moderate or severe.  Night sweats.  Decrease in sex drive.  Mood swings.  Headaches.  Tiredness.  Irritability.  Memory problems.  Insomnia. Choosing to treat or not to treat these symptoms is a decision that you make with your health care provider. Do I need hormone replacement therapy?  Hormone replacement therapy is effective in treating symptoms that are caused by menopause, such as hot flashes and night sweats.  Hormone replacement carries certain risks, especially as you become older. If you are thinking about using estrogen or estrogen with progestin, discuss the benefits and risks with your health care provider. What is my risk for heart disease and stroke? The risk of heart disease, heart attack, and stroke increases as you age. One of the causes may be a change in the body's hormones during menopause. This can affect how your body uses dietary fats, triglycerides, and cholesterol. Heart attack and stroke  are medical emergencies. There are many things that you can do to help prevent heart disease and stroke. Watch your blood pressure  High blood pressure causes heart disease and increases the risk of stroke. This is more likely to develop in people who have high blood pressure readings, are of African descent, or are overweight.  Have your blood pressure checked: ? Every 3-5 years if you are 18-39 years of age. ? Every year if you are 40 years old or older. Eat a healthy diet   Eat a diet that includes plenty of vegetables, fruits, low-fat dairy products, and lean protein.  Do not eat a lot of foods that are high in solid fats, added sugars, or sodium. Get regular exercise Get regular exercise. This is one of the most important things you can do for your health. Most adults should:  Try to exercise for at least 150 minutes each week. The exercise should increase your heart rate and make you sweat (moderate-intensity exercise).  Try to do strengthening exercises at least twice each week. Do these in addition to the moderate-intensity exercise.  Spend less time sitting. Even light physical activity can be beneficial. Other tips  Work with your health care provider to achieve or maintain a healthy weight.  Do not use any products that contain nicotine or tobacco, such as cigarettes, e-cigarettes, and chewing tobacco. If you need help quitting, ask your health care provider.  Know your numbers. Ask your health care provider to check your cholesterol and your blood sugar (glucose). Continue to have your blood tested as directed by your health care provider. Do I need screening for   cancer? Depending on your health history and family history, you may need to have cancer screening at different stages of your life. This may include screening for:  Breast cancer.  Cervical cancer.  Lung cancer.  Colorectal cancer. What is my risk for osteoporosis? After menopause, you may be at increased  risk for osteoporosis. Osteoporosis is a condition in which bone destruction happens more quickly than new bone creation. To help prevent osteoporosis or the bone fractures that can happen because of osteoporosis, you may take the following actions:  If you are 19-50 years old, get at least 1,000 mg of calcium and at least 600 mg of vitamin D per day.  If you are older than age 50 but younger than age 70, get at least 1,200 mg of calcium and at least 600 mg of vitamin D per day.  If you are older than age 70, get at least 1,200 mg of calcium and at least 800 mg of vitamin D per day. Smoking and drinking excessive alcohol increase the risk of osteoporosis. Eat foods that are rich in calcium and vitamin D, and do weight-bearing exercises several times each week as directed by your health care provider. How does menopause affect my mental health? Depression may occur at any age, but it is more common as you become older. Common symptoms of depression include:  Low or sad mood.  Changes in sleep patterns.  Changes in appetite or eating patterns.  Feeling an overall lack of motivation or enjoyment of activities that you previously enjoyed.  Frequent crying spells. Talk with your health care provider if you think that you are experiencing depression. General instructions See your health care provider for regular wellness exams and vaccines. This may include:  Scheduling regular health, dental, and eye exams.  Getting and maintaining your vaccines. These include: ? Influenza vaccine. Get this vaccine each year before the flu season begins. ? Pneumonia vaccine. ? Shingles vaccine. ? Tetanus, diphtheria, and pertussis (Tdap) booster vaccine. Your health care provider may also recommend other immunizations. Tell your health care provider if you have ever been abused or do not feel safe at home. Summary  Menopause is a normal process in which your ability to get pregnant comes to an  end.  This condition causes hot flashes, night sweats, decreased interest in sex, mood swings, headaches, or lack of sleep.  Treatment for this condition may include hormone replacement therapy.  Take actions to keep yourself healthy, including exercising regularly, eating a healthy diet, watching your weight, and checking your blood pressure and blood sugar levels.  Get screened for cancer and depression. Make sure that you are up to date with all your vaccines. This information is not intended to replace advice given to you by your health care provider. Make sure you discuss any questions you have with your health care provider. Document Revised: 04/27/2018 Document Reviewed: 04/27/2018 Elsevier Patient Education  2020 Elsevier Inc.  

## 2019-10-13 NOTE — Progress Notes (Signed)
Complete physical exam   Patient: Krista Cooper   DOB: Apr 20, 1956   64 y.o. Female  MRN: [id number] Visit Date: 10/13/2019  Today's healthcare provider: Margaretann Loveless, PA-C   Chief Complaint  Patient presents with  . Annual Exam   Subjective    Krista Cooper is a 64 y.o. female who presents today for a complete physical exam.  She reports consuming a general diet. Home exercise routine includes walking for 30-40 minute daily. She generally feels well. She reports sleeping fairly well. She does not have additional problems to discuss today.  HPI  Patient reports that her BP readings at home have been 120's-110's/60-70's  Past Medical History:  Diagnosis Date  . Allergy   . Anxiety   . Hyperlipidemia   . Hypertension    Past Surgical History:  Procedure Laterality Date  . CESAREAN SECTION    . FRACTURE SURGERY Right 1997   PLATES AND PINS  . MOUTH SURGERY  1976   WISDOM TEETH REMOVED   Social History   Socioeconomic History  . Marital status: Married    Spouse name: Not on file  . Number of children: Not on file  . Years of education: Not on file  . Highest education level: Not on file  Occupational History  . 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 Smoking status: Never Smoker  . Smokeless tobacco: Never Used  Substance and Sexual Activity  . Alcohol use: Yes    Comment: rare glass of wine  . Drug use: No  . Sexual activity: Not on file  Other Topics Concern  . Not on file  Social History Narrative  . Not on file   Social Determinants of Health   Financial Resource Strain:   . Difficulty of Paying Living Expenses:   Food Insecurity:   . Worried About Programme researcher, broadcasting/film/video in the Last Year:   . Barista in the Last Year:   Transportation Needs:   . Freight forwarder (Medical):   Marland Kitchen Lack of Transportation (Non-Medical):   Physical Activity:   . Days of Exercise per Week:   . Minutes of  Exercise per Session:   Stress:   . Feeling of Stress :   Social Connections:   . Frequency of Communication with Friends and Family:   . Frequency of Social Gatherings with Friends and Family:   . Attends Religious Services:   . Active Member of Clubs or Organizations:   . Attends Banker Meetings:   Marland Kitchen Marital Status:   Intimate Partner Violence:   . Fear of Current or Ex-Partner:   . Emotionally Abused:   Marland Kitchen Physically Abused:   . Sexually Abused:    Family Status  Relation Name Status  . Mother  Deceased  . Father  Deceased at age 31       LUNG CANCER  . PGF  Deceased       MI  . Daughter  Deceased       MVA  . MGM  (Not Specified)  . Neg Hx  (Not Specified)   Family History  Problem Relation Age of Onset  . Diabetes Mother   . Hyperlipidemia Mother   . COPD Mother   . Hypertension Mother   . Cancer Father   . Congestive Heart Failure Father   . Hypertension Father   . Diabetes Paternal Grandfather   .  CVA Maternal Grandmother   . Breast cancer Neg Hx    Allergies  Allergen Reactions  . Codeine     Itching; Can take Cough Syrup  . Lisinopril Cough    Patient Care Team: Mar Daring, PA-C as PCP - General (Family Medicine)   Medications: Outpatient Medications Prior to Visit  Medication Sig  . aspirin 81 MG tablet Take 1 tablet by mouth daily.  . B Complex Vitamins (VITAMIN B COMPLEX PO) Take 1 tablet by mouth daily.  . Cholecalciferol 10000 UNITS CAPS Take 2 capsules by mouth daily.   . fluticasone (FLONASE) 50 MCG/ACT nasal spray Place 2 sprays into both nostrils daily.  Marland Kitchen levocetirizine (XYZAL) 5 MG tablet Take 1 tablet (5 mg total) by mouth every evening.  . MULTIPLE VITAMIN PO Take 1 tablet by mouth daily.  . simvastatin (ZOCOR) 20 MG tablet Take 1 tablet (20 mg total) by mouth at bedtime.  . triamterene-hydrochlorothiazide (MAXZIDE-25) 37.5-25 MG tablet Take 1 tablet by mouth once daily  . omega-3 fish oil (MAXEPA) 1000 MG CAPS  capsule Take 1 capsule by mouth daily.   No facility-administered medications prior to visit.    Review of Systems  Constitutional: Negative.   HENT: Negative.   Eyes: Negative.   Respiratory: Positive for chest tightness ("only has happened 3 times in 6 months").   Cardiovascular: Negative.   Gastrointestinal: Negative.   Endocrine: Negative.   Genitourinary: Negative.   Musculoskeletal: Negative.   Skin: Negative.   Allergic/Immunologic: Negative.   Neurological: Positive for headaches (1-2 per month).  Hematological: Negative.   Psychiatric/Behavioral: The patient is nervous/anxious.       Objective    BP (!) 146/84 (BP Location: Left Arm, Patient Position: Sitting, Cuff Size: Large)   Pulse 94   Temp (!) 97 F (36.1 C) (Temporal)   Resp 16   Ht 5\' 6"  (1.676 m)   Wt 180 lb 9.6 oz (81.9 kg)   BMI 29.15 kg/m    Physical Exam Vitals reviewed.  Constitutional:      General: She is not in acute distress.    Appearance: Normal appearance. She is well-developed. She is not ill-appearing or diaphoretic.  HENT:     Head: Normocephalic and atraumatic.     Right Ear: Hearing, tympanic membrane, ear canal and external ear normal.     Left Ear: Hearing, tympanic membrane, ear canal and external ear normal.     Mouth/Throat:     Pharynx: Uvula midline.  Eyes:     General: No scleral icterus.       Right eye: No discharge.        Left eye: No discharge.     Extraocular Movements: Extraocular movements intact.     Conjunctiva/sclera: Conjunctivae normal.     Pupils: Pupils are equal, round, and reactive to light.  Neck:     Thyroid: No thyromegaly.     Vascular: No carotid bruit or JVD.     Trachea: No tracheal deviation.  Cardiovascular:     Rate and Rhythm: Normal rate and regular rhythm.     Pulses: Normal pulses.     Heart sounds: Normal heart sounds. No murmur. No friction rub. No gallop.   Pulmonary:     Effort: Pulmonary effort is normal. No respiratory  distress.     Breath sounds: Normal breath sounds. No wheezing or rales.  Chest:     Chest wall: No tenderness.     Breasts: Breasts are symmetrical.  Right: No inverted nipple, mass, nipple discharge, skin change or tenderness.        Left: No inverted nipple, mass, nipple discharge, skin change or tenderness.  Abdominal:     General: Abdomen is flat. Bowel sounds are normal. There is no distension.     Palpations: Abdomen is soft. There is no mass.     Tenderness: There is no abdominal tenderness. There is no guarding or rebound.     Hernia: There is no hernia in the left inguinal area.  Genitourinary:    General: Normal vulva.     Exam position: Supine.     Labia:        Right: No rash, tenderness, lesion or injury.        Left: No rash, tenderness, lesion or injury.      Vagina: Normal. No signs of injury. No vaginal discharge, erythema, tenderness or bleeding.     Cervix: No cervical motion tenderness, discharge or friability.     Adnexa:        Right: No mass, tenderness or fullness.         Left: No mass, tenderness or fullness.       Rectum: Normal.  Musculoskeletal:        General: No tenderness. Normal range of motion.     Cervical back: Normal range of motion and neck supple.     Right lower leg: No edema.     Left lower leg: No edema.  Lymphadenopathy:     Cervical: No cervical adenopathy.  Skin:    General: Skin is warm and dry.     Capillary Refill: Capillary refill takes less than 2 seconds.     Findings: No rash.  Neurological:     General: No focal deficit present.     Mental Status: She is alert and oriented to person, place, and time. Mental status is at baseline.     Cranial Nerves: No cranial nerve deficit.     Coordination: Coordination normal.     Deep Tendon Reflexes: Reflexes are normal and symmetric.  Psychiatric:        Mood and Affect: Mood normal.        Behavior: Behavior normal.        Thought Content: Thought content normal.         Judgment: Judgment normal.      Depression Screen  PHQ 2/9 Scores 10/13/2019 09/30/2018 09/24/2017  PHQ - 2 Score 0 0 0  PHQ- 9 Score - - 0    No results found for any visits on 10/13/19.  Assessment & Plan    Routine Health Maintenance and Physical Exam  Exercise Activities and Dietary recommendations Goals   None     Immunization History  Administered Date(s) Administered  . Influenza,inj,Quad PF,6+ Mos 02/02/2014, 02/15/2015, 03/19/2016, 03/28/2018, 04/03/2019  . Influenza-Unspecified 03/16/2017  . Td 03/19/2016  . Tdap 10/15/2005  . Zoster Recombinat (Shingrix) 09/30/2018, 12/12/2018    Health Maintenance  Topic Date Due  . COVID-19 Vaccine (1) Never done  . PAP SMEAR-Modifier  09/24/2019  . Fecal DNA (Cologuard)  10/16/2019  . INFLUENZA VACCINE  12/17/2019  . MAMMOGRAM  12/19/2020  . TETANUS/TDAP  03/19/2026  . Hepatitis C Screening  Completed  . HIV Screening  Completed    Discussed health benefits of physical activity, and encouraged her to engage in regular exercise appropriate for her age and condition.  1. Routine general medical examination at a health care facility Normal physical exam  today. Will check labs as below and f/u pending lab results. If labs are stable and WNL she will not need to have these rechecked for one year at her next annual physical exam. She is to call the office in the meantime if she has any acute issue, questions or concerns.  2. Screening for thyroid disorder Will check labs as below and f/u pending results. - TSH  3. Encounter for breast cancer screening using non-mammogram modality Breast exam today was normal. There is no family history of breast cancer. She does perform regular self breast exams. Mammogram was ordered as below. Information for [facility] was given to patient so she may schedule her mammogram at her convenience. - MM 3D SCREEN BREAST BILATERAL  4. Cervical cancer screening Pap collected today.  Will send as below and f/u pending results. - Cytology - PAP  5. Colon cancer screening Due for repeat cologuard testing. Ordered as below. - Cologuard  6. White coat syndrome with diagnosis of hypertension Home readings normal. Continue Maxzide.  - CBC with Differential/Platelet - Comprehensive metabolic panel  7. Hypercholesteremia Stable. Continue Simvastatin 20mg .  - Lipid panel - CBC with Differential/Platelet - Comprehensive metabolic panel - Hemoglobin A1c  8. Borderline diabetes Diet controlled. Will check labs as below and f/u pending results. - CBC with Differential/Platelet - Comprehensive metabolic panel - Hemoglobin A1c  9. Avitaminosis D Continue Vit D 2000 IU daily. Will check labs as below and f/u pending results. - CBC with Differential/Platelet - Vitamin D (25 hydroxy)  10. Essential hypertension Stable on Maxzide. Will check labs as below and f/u pending results. - CBC with Differential/Platelet - Comprehensive metabolic panel - Hemoglobin A1c   No follow-ups on file.     , PA-C, have reviewed all documentation for this visit. The documentation on 10/13/19 for the exam, diagnosis, procedures, and orders are all accurate and complete.   10/15/19  [facility] [phone] (phone) [phone] (fax)  [facility]

## 2019-10-14 LAB — CBC WITH DIFFERENTIAL/PLATELET
Basophils Absolute: 0 10*3/uL (ref 0.0–0.2)
Basos: 0 %
EOS (ABSOLUTE): 0 10*3/uL (ref 0.0–0.4)
Eos: 1 %
Hematocrit: 40.7 % (ref 34.0–46.6)
Hemoglobin: 13.5 g/dL (ref 11.1–15.9)
Immature Grans (Abs): 0 10*3/uL (ref 0.0–0.1)
Immature Granulocytes: 0 %
Lymphocytes Absolute: 1.1 10*3/uL (ref 0.7–3.1)
Lymphs: 19 %
MCH: 28.7 pg (ref 26.6–33.0)
MCHC: 33.2 g/dL (ref 31.5–35.7)
MCV: 86 fL (ref 79–97)
Monocytes Absolute: 0.4 10*3/uL (ref 0.1–0.9)
Monocytes: 7 %
Neutrophils Absolute: 4.4 10*3/uL (ref 1.4–7.0)
Neutrophils: 73 %
Platelets: 346 10*3/uL (ref 150–450)
RBC: 4.71 x10E6/uL (ref 3.77–5.28)
RDW: 13.1 % (ref 11.7–15.4)
WBC: 6 10*3/uL (ref 3.4–10.8)

## 2019-10-14 LAB — COMPREHENSIVE METABOLIC PANEL
ALT: 29 IU/L (ref 0–32)
AST: 33 IU/L (ref 0–40)
Albumin/Globulin Ratio: 2.2 (ref 1.2–2.2)
Albumin: 4.7 g/dL (ref 3.8–4.8)
Alkaline Phosphatase: 73 IU/L (ref 48–121)
BUN/Creatinine Ratio: 27 (ref 12–28)
BUN: 16 mg/dL (ref 8–27)
Bilirubin Total: 0.5 mg/dL (ref 0.0–1.2)
CO2: 24 mmol/L (ref 20–29)
Calcium: 9.6 mg/dL (ref 8.7–10.3)
Chloride: 98 mmol/L (ref 96–106)
Creatinine, Ser: 0.59 mg/dL (ref 0.57–1.00)
GFR calc Af Amer: 113 mL/min/{1.73_m2} (ref >59)
GFR calc non Af Amer: 98 mL/min/{1.73_m2} (ref >59)
Globulin, Total: 2.1 g/dL (ref 1.5–4.5)
Glucose: 111 mg/dL — ABNORMAL HIGH (ref 65–99)
Potassium: 3.7 mmol/L (ref 3.5–5.2)
Sodium: 136 mmol/L (ref 134–144)
Total Protein: 6.8 g/dL (ref 6.0–8.5)

## 2019-10-14 LAB — HEMOGLOBIN A1C
Est. average glucose Bld gHb Est-mCnc: 126 mg/dL
Hgb A1c MFr Bld: 6 % — ABNORMAL HIGH (ref 4.8–5.6)

## 2019-10-14 LAB — LIPID PANEL
Chol/HDL Ratio: 2.6 ratio (ref 0.0–4.4)
Cholesterol, Total: 193 mg/dL (ref 100–199)
HDL: 73 mg/dL (ref >39)
LDL Chol Calc (NIH): 106 mg/dL — ABNORMAL HIGH (ref 0–99)
Triglycerides: 77 mg/dL (ref 0–149)
VLDL Cholesterol Cal: 14 mg/dL (ref 5–40)

## 2019-10-14 LAB — TSH: TSH: 1.8 u[IU]/mL (ref 0.450–4.500)

## 2019-10-14 LAB — VITAMIN D 25 HYDROXY (VIT D DEFICIENCY, FRACTURES): Vit D, 25-Hydroxy: 39.1 ng/mL (ref 30.0–100.0)

## 2019-10-17 ENCOUNTER — Telehealth: Payer: Self-pay

## 2019-10-17 NOTE — Telephone Encounter (Signed)
Patient advised as below. Patient verbalizes understanding and is in agreement with treatment plan.  

## 2019-10-17 NOTE — Telephone Encounter (Signed)
-----   Message from Margaretann Loveless, [place] sent at 10/17/2019  1:08 PM EDT ----- Cholesterol is normal. Thyroid is normal. Blood count is normal. Kidney and liver function are normal. A1c/Sugar borderline high but ok, limit carbs and sugars in diet. Sodium, potassium and calcium are normal. Vit D is normal.

## 2019-10-19 ENCOUNTER — Telehealth: Payer: Self-pay

## 2019-10-19 LAB — CYTOLOGY - PAP
Comment: NEGATIVE
Diagnosis: NEGATIVE
High risk HPV: NEGATIVE

## 2019-10-19 NOTE — Telephone Encounter (Signed)
-----   Message from Margaretann Loveless, PA-C sent at 10/19/2019 10:54 AM EDT ----- Pap is normal, HPV negative.  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 Guidelines state if has been normal, can stop screening at 64 yrs old.

## 2019-10-19 NOTE — Telephone Encounter (Signed)
Patient advised as directed below. 

## 2019-10-24 ENCOUNTER — Ambulatory Visit: Payer: Self-pay

## 2019-10-24 DIAGNOSIS — F4321 Adjustment disorder with depressed mood: Secondary | ICD-10-CM

## 2019-10-24 DIAGNOSIS — F418 Other specified anxiety disorders: Secondary | ICD-10-CM

## 2019-10-24 NOTE — Telephone Encounter (Signed)
Patient called stating that her husband just passed away today. He has been under hospice care.  She is requesting something to help her with her emotions.  She denies suicidal and homicidal thoughts. She is calm but tearful. She had to end the call due to funeral home arriving.  She has family across the street for support/. Patient states that her daughter passed away on this date several years ago.  Will route message to office. Answer Assessment - Initial Assessment Questions 1. CONCERN: "What happened that made you call today?"     Husband passed away today 2. ANXIETY SYMPTOM SCREENING: "Can you describe how you have been feeling?"  (e.g., tense, restless, panicky, anxious, keyed up, trouble sleeping, trouble concentrating)   crying 3. ONSET: "How long have you been feeling this way?"    today 4. RECURRENT: "Have you felt this way before?"  If yes: "What happened that time?" "What helped these feelings go away in the past?"   husbnd passed away today 5. RISK OF HARM - SUICIDAL IDEATION:  "Do you ever have thoughts of hurting or killing yourself?"  (e.g., yes, no, no but preoccupation with thoughts about death)   - INTENT:  "Do you have thoughts of hurting or killing yourself right NOW?" (e.g., yes, no, N/A)   - PLAN: "Do you have a specific plan for how you would do this?" (e.g., gun, knife, overdose, no plan, N/A)   no 6. RISK OF HARM - HOMICIDAL IDEATION:  "Do you ever have thoughts of hurting or killing someone else?"  (e.g., yes, no, no but preoccupation with thoughts about death)   - INTENT:  "Do you have thoughts of hurting or killing someone right NOW?" (e.g., yes, no, N/A)   - PLAN: "Do you have a specific plan for how you would do this?" (e.g., gun, knife, no plan, N/A)      No 7. FUNCTIONAL IMPAIRMENT: "How have things been going for you overall in your life? Have you had any more difficulties than usual doing your normal daily activities?"  (e.g., better, same, worse; self-care,  school, work, Curator)    Hospice, no family with her 8. SUPPORT: "Who is with you now?" "Who do you live with?" "Do you have family or friends nearby who you can talk to?"     Family across the street 9. THERAPIST: "Do you have a counselor or therapist? Name?"    No 10. STRESSORS: "Has there been any new stress or recent changes in your life?"       Recent today but daughter passed several years ago 38. CAFFEINE ABUSE: "Do you drink caffeinated beverages, and how much each day?" (e.g., coffee, tea, colas)      N/A 12. SUBSTANCE ABUSE: "Do you use any illegal drugs or alcohol?"       N/A 13. OTHER SYMPTOMS: "Do you have any other physical symptoms right now?" (e.g., chest pain, palpitations, difficulty breathing, fever)       N/A 14. PREGNANCY: "Is there any chance you are pregnant?" "When was your last menstrual period?"     N/A  Protocols used: ANXIETY AND PANIC ATTACK-A-AH

## 2019-10-24 NOTE — Telephone Encounter (Signed)
Sorry about her husband. I think she needs evaluation. I will route to [place] as well and see if she feels differently.

## 2019-10-24 NOTE — Telephone Encounter (Signed)
Please advise 

## 2019-10-25 LAB — COLOGUARD
COLOGUARD: NEGATIVE
Cologuard: NEGATIVE

## 2019-10-25 MED ORDER — ALPRAZOLAM 0.25 MG PO TABS: 0.2500 mg | ORAL_TABLET | Freq: Two times a day (BID) | ORAL | 0 refills | Status: DC | PRN

## 2019-10-25 NOTE — Telephone Encounter (Signed)
I am so sorry to hear about her husband as well. I will send in a small dose of alprazolam for now and if symptoms persist and she is interested in longer term treatment she can call and come in for an appt.

## 2019-10-25 NOTE — Addendum Note (Signed)
Addended by: Margaretann Loveless on: 10/25/2019 09:06 AM   Modules accepted: Orders

## 2019-10-25 NOTE — Telephone Encounter (Signed)
Patient was advised via detailed voice message (per DPR) and was advised to call back if have any questions or concerns.

## 2019-10-26 ENCOUNTER — Telehealth: Payer: Self-pay

## 2019-10-26 NOTE — Telephone Encounter (Signed)
Patient advised.

## 2019-10-26 NOTE — Telephone Encounter (Signed)
-----   Message from Margaretann Loveless, PA-C sent at 10/26/2019  2:39 PM EDT ----- Cologuard negative. Repeat in 3 years

## 2019-11-07 ENCOUNTER — Other Ambulatory Visit: Payer: Self-pay | Admitting: Physician Assistant

## 2019-11-07 DIAGNOSIS — I10 Essential (primary) hypertension: Secondary | ICD-10-CM

## 2019-11-07 MED ORDER — TRIAMTERENE-HCTZ 37.5-25 MG PO TABS: 1.0000 | ORAL_TABLET | Freq: Every day | ORAL | 0 refills | Status: DC

## 2019-11-07 NOTE — Telephone Encounter (Signed)
Pt request refill  triamterene-hydrochlorothiazide (MAXZIDE-25) 37.5-25 MG tablet  90 day  [facility] [address], [place] - [address] Phone:  [phone]  Fax:  [phone]

## 2019-12-02 ENCOUNTER — Other Ambulatory Visit: Payer: Self-pay | Admitting: Physician Assistant

## 2019-12-02 DIAGNOSIS — E7800 Pure hypercholesterolemia, unspecified: Secondary | ICD-10-CM

## 2019-12-02 NOTE — Telephone Encounter (Signed)
Requested Prescriptions  Pending Prescriptions Disp Refills  . simvastatin (ZOCOR) 20 MG tablet [Pharmacy Med Name: Simvastatin 20 MG Oral Tablet] 90 tablet 3    Sig: TAKE 1 TABLET BY MOUTH AT BEDTIME     Cardiovascular:  Antilipid - Statins Failed - 12/02/2019 10:02 AM      Failed - LDL in normal range and within 360 days    LDL Chol Calc (NIH)  Date Value Ref Range Status  10/13/2019 106 (H) 0 - 99 mg/dL Final         Passed - Total Cholesterol in normal range and within 360 days    Cholesterol, Total  Date Value Ref Range Status  10/13/2019 193 100 - 199 mg/dL Final         Passed - HDL in normal range and within 360 days    HDL  Date Value Ref Range Status  10/13/2019 73 >39 mg/dL Final         Passed - Triglycerides in normal range and within 360 days    Triglycerides  Date Value Ref Range Status  10/13/2019 77 0 - 149 mg/dL Final         Passed - Patient is not pregnant      Passed - Valid encounter within last 12 months    Recent Outpatient Visits          1 month ago Routine general medical examination at a health care facility   [facility] [place], [place], [place]   8 months ago Essential hypertension   [facility], [name], [place]   1 year ago Annual physical exam   [facility] Joycelyn Man [place], [place]   1 year ago White coat syndrome without diagnosis of hypertension   [facility], [name], [place]   2 years ago Annual physical exam   [facility], [name], [place]      Future Appointments            In 10 months Burnette, [name], PA-C [facility], [facility]

## 2019-12-21 ENCOUNTER — Other Ambulatory Visit: Payer: Self-pay

## 2019-12-21 ENCOUNTER — Ambulatory Visit
Admission: RE | Admit: 2019-12-21 | Discharge: 2019-12-21 | Disposition: A | Discharge status: Home / Self Care | Payer: Federal, State, Local not specified - PPO | Source: Ambulatory Visit | Attending: Physician Assistant | Admitting: Physician Assistant

## 2019-12-21 DIAGNOSIS — Z1231 Encounter for screening mammogram for malignant neoplasm of breast: Secondary | ICD-10-CM | POA: Diagnosis not present

## 2019-12-22 ENCOUNTER — Telehealth: Payer: Self-pay

## 2019-12-22 NOTE — Telephone Encounter (Signed)
LMTCB-if patient calls back ok for [facility] nurse to give results °

## 2019-12-22 NOTE — Telephone Encounter (Signed)
-----   Message from Margaretann Loveless, PA-C sent at 12/22/2019  1:28 PM EDT ----- Normal mammogram. Repeat screening in one year.

## 2019-12-22 NOTE — Telephone Encounter (Signed)
Patient given results as noted by Joycelyn Man, PA-C on 12/22/19. Patient verbalized understanding.

## 2020-01-26 ENCOUNTER — Other Ambulatory Visit: Payer: Self-pay | Admitting: Physician Assistant

## 2020-01-26 DIAGNOSIS — I10 Essential (primary) hypertension: Secondary | ICD-10-CM

## 2020-03-16 ENCOUNTER — Other Ambulatory Visit: Payer: Self-pay | Admitting: Physician Assistant

## 2020-03-16 DIAGNOSIS — J302 Other seasonal allergic rhinitis: Secondary | ICD-10-CM

## 2020-03-16 NOTE — Telephone Encounter (Signed)
Requested Prescriptions  Pending Prescriptions Disp Refills   levocetirizine (XYZAL) 5 MG tablet [Pharmacy Med Name: Levocetirizine Dihydrochloride 5 MG Oral Tablet] 90 tablet 1    Sig: TAKE 1 TABLET BY MOUTH ONCE DAILY IN THE EVENING     Ear, Nose, and Throat:  Antihistamines Passed - 03/16/2020  9:30 AM      Passed - Valid encounter within last 12 months    Recent Outpatient Visits          5 months ago Routine general medical examination at a health care facility   [facility] Joycelyn Man [place], [place]   11 months ago Essential hypertension   [facility], [name], [place]   1 year ago Annual physical exam   [facility] Joycelyn Man [place], [place]   1 year ago White coat syndrome without diagnosis of hypertension   [facility] [name], [place]   2 years ago Annual physical exam   [facility] [place], [name], [place]      Future Appointments            In 7 months Burnette, [name], PA-C [facility], [facility]

## 2020-04-03 ENCOUNTER — Other Ambulatory Visit: Payer: Self-pay

## 2020-04-03 ENCOUNTER — Ambulatory Visit (INDEPENDENT_AMBULATORY_CARE_PROVIDER_SITE_OTHER): Payer: Federal, State, Local not specified - PPO

## 2020-04-03 DIAGNOSIS — Z23 Encounter for immunization: Secondary | ICD-10-CM | POA: Diagnosis not present

## 2020-04-27 ENCOUNTER — Other Ambulatory Visit: Payer: Self-pay | Admitting: Physician Assistant

## 2020-04-27 DIAGNOSIS — I10 Essential (primary) hypertension: Secondary | ICD-10-CM

## 2020-04-27 NOTE — Telephone Encounter (Signed)
Requested medications are due for refill today yes  Requested medications are on the active medication list yes  Last refill 9/20  Last visit 09/2019  Future visit scheduled yes 10/2020  Notes to clinic Failed protocol due to no valid visit within 6  months.

## 2020-04-29 NOTE — Telephone Encounter (Signed)
Medication Refill - Medication: triamterene-hydrochlorothiazide (MAXZIDE-25) 37.5-25 MG tablet   Has the patient contacted their pharmacy? Yes.   (Agent: If no, request that the patient contact the pharmacy for the refill.) (Agent: If yes, when and what did the pharmacy advise?)  Preferred Pharmacy (with phone number or street name): [facility] [address], [place] - [address]  [address] [place] [address]  Phone:  [phone] Fax:  [phone]   Agent: Please be advised that RX refills may take up to 3 business days. We ask that you follow-up with your pharmacy.

## 2020-05-29 ENCOUNTER — Other Ambulatory Visit: Payer: Self-pay | Admitting: Physician Assistant

## 2020-05-29 DIAGNOSIS — J302 Other seasonal allergic rhinitis: Secondary | ICD-10-CM

## 2020-05-31 DIAGNOSIS — Z20828 Contact with and (suspected) exposure to other viral communicable diseases: Secondary | ICD-10-CM | POA: Diagnosis not present

## 2020-06-24 DIAGNOSIS — H5789 Other specified disorders of eye and adnexa: Secondary | ICD-10-CM | POA: Diagnosis not present

## 2020-09-21 ENCOUNTER — Other Ambulatory Visit: Payer: Self-pay | Admitting: Physician Assistant

## 2020-09-21 DIAGNOSIS — J302 Other seasonal allergic rhinitis: Secondary | ICD-10-CM

## 2020-09-25 ENCOUNTER — Other Ambulatory Visit: Payer: Self-pay | Admitting: Physician Assistant

## 2020-09-25 ENCOUNTER — Telehealth: Payer: Self-pay

## 2020-09-25 DIAGNOSIS — Z1239 Encounter for other screening for malignant neoplasm of breast: Secondary | ICD-10-CM

## 2020-09-25 DIAGNOSIS — J302 Other seasonal allergic rhinitis: Secondary | ICD-10-CM

## 2020-09-25 NOTE — Telephone Encounter (Signed)
Requested Prescriptions  Pending Prescriptions Disp Refills  . levocetirizine (XYZAL) 5 MG tablet [Pharmacy Med Name: Levocetirizine Dihydrochloride 5 MG Oral Tablet] 90 tablet 0    Sig: TAKE 1 TABLET BY MOUTH ONCE DAILY IN THE EVENING     Ear, Nose, and Throat:  Antihistamines Passed - 09/25/2020 12:00 AM      Passed - Valid encounter within last 12 months    Recent Outpatient Visits          11 months ago Routine general medical examination at a health care facility   [facility] [place], [name], [place]   1 year ago Essential hypertension   [facility] [place], [name], [place]   1 year ago Annual physical exam   [facility] Joycelyn Man [place], [place]   2 years ago White coat syndrome without diagnosis of hypertension   [facility], [name], [place]   3 years ago Annual physical exam   [facility], [name], [place]      Future Appointments            In 5 months Bacigalupo, [name], MD [facility], [facility]

## 2020-09-25 NOTE — Telephone Encounter (Signed)
Copied from CRM [id number]. Topic: General - Other >> Sep 25, 2020  4:47 PM Gaetana Michaelis A wrote: Reason for CRM: Patient has called requesting for to have an order for a mammogram submitted to [facility]  Patient has previously shared with the practice that they would like for Dr. Beryle Flock to be their PCP  Delford Field has instructed the patient to contact Dr. Beryle Flock for an order so that the procedure can be scheduled  Please contact to further advise when possible

## 2020-09-26 NOTE — Telephone Encounter (Signed)
Order signed for screening mammogram

## 2020-10-18 ENCOUNTER — Encounter: Payer: Self-pay | Admitting: Physician Assistant

## 2020-10-26 ENCOUNTER — Other Ambulatory Visit: Payer: Self-pay | Admitting: Physician Assistant

## 2020-10-26 DIAGNOSIS — I10 Essential (primary) hypertension: Secondary | ICD-10-CM

## 2020-10-30 NOTE — Telephone Encounter (Signed)
Medication Refill - Medication: triamterene-hydrochlorothiazide (MAXZIDE-25) 37.5-25 MG tablet [Pharmacy Med Name: Triamterene-HCTZ 37.5-25 MG Oral Tablet Pt needs enough to get her to her next appt with Dr. Leonard Schwartz in October / please advise   Has the patient contacted their pharmacy? Yes.   (Agent: If no, request that the patient contact the pharmacy for the refill.) (Agent: If yes, when and what did the pharmacy advise?)sent request to jenni bernette   Preferred Pharmacy (with phone number or street name): [facility] [address] [place] - [address]  [address] [place] [address]  Phone:  [phone]  Fax:  [phone]   Agent: Please be advised that RX refills may take up to 3 business days. We ask that you follow-up with your pharmacy.

## 2020-10-30 NOTE — Telephone Encounter (Signed)
  Notes to clinic: Review for enough medication until appt in 02/2021   Requested Prescriptions  Pending Prescriptions Disp Refills   triamterene-hydrochlorothiazide (MAXZIDE-25) 37.5-25 MG tablet [Pharmacy Med Name: Triamterene-HCTZ 37.5-25 MG Oral Tablet] 90 tablet 0    Sig: Take 1 tablet by mouth once daily      Cardiovascular: Diuretic Combos Failed - 10/30/2020  9:09 AM      Failed - K in normal range and within 360 days    Potassium  Date Value Ref Range Status  10/13/2019 3.7 3.5 - 5.2 mmol/L Final          Failed - Na in normal range and within 360 days    Sodium  Date Value Ref Range Status  10/13/2019 136 134 - 144 mmol/L Final          Failed - Cr in normal range and within 360 days    Creatinine, Ser  Date Value Ref Range Status  10/13/2019 0.59 0.57 - 1.00 mg/dL Final          Failed - Ca in normal range and within 360 days    Calcium  Date Value Ref Range Status  10/13/2019 9.6 8.7 - 10.3 mg/dL Final          Failed - Last BP in normal range    BP Readings from Last 1 Encounters:  10/13/19 (!) 146/84          Failed - Valid encounter within last 6 months    Recent Outpatient Visits           1 year ago Routine general medical examination at a health care facility   [facility], [name], [place]   1 year ago Essential hypertension   [facility] [place], [name], [place]   2 years ago Annual physical exam   [facility] Joycelyn [place], [place]   2 years ago White coat syndrome without diagnosis of hypertension   [facility], [name], [place]   3 years ago Annual physical exam   [facility], [name], [place]       Future Appointments             In 4 months Bacigalupo, [name], MD [facility], [facility]

## 2020-11-06 DIAGNOSIS — H16001 Unspecified corneal ulcer, right eye: Secondary | ICD-10-CM | POA: Diagnosis not present

## 2020-11-07 DIAGNOSIS — H16001 Unspecified corneal ulcer, right eye: Secondary | ICD-10-CM | POA: Diagnosis not present

## 2020-11-11 DIAGNOSIS — H16001 Unspecified corneal ulcer, right eye: Secondary | ICD-10-CM | POA: Diagnosis not present

## 2020-11-25 ENCOUNTER — Other Ambulatory Visit: Payer: Self-pay | Admitting: Family Medicine

## 2020-11-25 DIAGNOSIS — I10 Essential (primary) hypertension: Secondary | ICD-10-CM

## 2020-11-25 NOTE — Telephone Encounter (Signed)
Pt. Has appointment 12/26/20.

## 2020-12-02 ENCOUNTER — Telehealth: Payer: Self-pay | Admitting: Family Medicine

## 2020-12-02 DIAGNOSIS — I10 Essential (primary) hypertension: Secondary | ICD-10-CM

## 2020-12-02 MED ORDER — TRIAMTERENE-HCTZ 37.5-25 MG PO TABS: ORAL_TABLET | ORAL | 0 refills | Status: DC

## 2020-12-02 NOTE — Telephone Encounter (Signed)
  Notes to clinic:  Patient would like enough medication until appt on 12/20/2020   Requested Prescriptions  Pending Prescriptions Disp Refills   triamterene-hydrochlorothiazide (MAXZIDE-25) 37.5-25 MG tablet 24 tablet 0    Sig: TAKE 1 TABLET BY MOUTH ONCE DAILY -  NEED  APPT  FOR  FURTHER  REFILLS      Cardiovascular: Diuretic Combos Failed - 12/02/2020 11:33 AM      Failed - K in normal range and within 360 days    Potassium  Date Value Ref Range Status  10/13/2019 3.7 3.5 - 5.2 mmol/L Final          Failed - Na in normal range and within 360 days    Sodium  Date Value Ref Range Status  10/13/2019 136 134 - 144 mmol/L Final          Failed - Cr in normal range and within 360 days    Creatinine, Ser  Date Value Ref Range Status  10/13/2019 0.59 0.57 - 1.00 mg/dL Final          Failed - Ca in normal range and within 360 days    Calcium  Date Value Ref Range Status  10/13/2019 9.6 8.7 - 10.3 mg/dL Final          Failed - Last BP in normal range    BP Readings from Last 1 Encounters:  10/13/19 (!) 146/84          Failed - Valid encounter within last 6 months    Recent Outpatient Visits           1 year ago Routine general medical examination at a health care facility   [facility], [name], [place]   1 year ago Essential hypertension   [facility], [name], [place]   2 years ago Annual physical exam   [facility] Joy[place], [place]   2 years ago White coat syndrome without diagnosis of hypertension   [facility], [name], [place]   3 years ago Annual physical exam   [facility], [name], [place]       Future Appointments             In 2 weeks Bacigalupo, [name], MD [facility], [facility]   In 2 months Bacigalupo, [name], MD [facility], [facility]

## 2020-12-02 NOTE — Telephone Encounter (Signed)
Medication Refill - Medication:   triamterene-hydrochlorothiazide (MAXZIDE-25) 37.5-25 MG tablet   Has the patient contacted their pharmacy? No refills left, pt wanted to know if a courtesy refill could be sent in until her appt on 8/5.    Preferred Pharmacy (with phone number or street name):   [facility] [address], [place] - [address]  [address] [place] [address]  Phone:  [phone]  Fax:  [phone]   Agent: Please be advised that RX refills may take up to 3 business days. We ask that you follow-up with your pharmacy.

## 2020-12-20 ENCOUNTER — Ambulatory Visit: Payer: Federal, State, Local not specified - PPO | Admitting: Family Medicine

## 2020-12-23 ENCOUNTER — Encounter: Payer: Self-pay | Admitting: Family Medicine

## 2020-12-23 ENCOUNTER — Ambulatory Visit (INDEPENDENT_AMBULATORY_CARE_PROVIDER_SITE_OTHER): Payer: Medicare Other | Admitting: Family Medicine

## 2020-12-23 ENCOUNTER — Other Ambulatory Visit: Payer: Self-pay

## 2020-12-23 ENCOUNTER — Ambulatory Visit
Admission: RE | Admit: 2020-12-23 | Discharge: 2020-12-23 | Disposition: A | Discharge status: Home / Self Care | Payer: Medicare Other | Source: Ambulatory Visit | Attending: Family Medicine | Admitting: Family Medicine

## 2020-12-23 VITALS — BP 144/80 | HR 105 | Temp 97.7°F | Resp 18 | Wt 203.2 lb

## 2020-12-23 DIAGNOSIS — E559 Vitamin D deficiency, unspecified: Secondary | ICD-10-CM

## 2020-12-23 DIAGNOSIS — E669 Obesity, unspecified: Secondary | ICD-10-CM

## 2020-12-23 DIAGNOSIS — I10 Essential (primary) hypertension: Secondary | ICD-10-CM | POA: Diagnosis not present

## 2020-12-23 DIAGNOSIS — E66811 Obesity, class 1: Secondary | ICD-10-CM

## 2020-12-23 DIAGNOSIS — E7800 Pure hypercholesterolemia, unspecified: Secondary | ICD-10-CM

## 2020-12-23 DIAGNOSIS — Z6832 Body mass index (BMI) 32.0-32.9, adult: Secondary | ICD-10-CM

## 2020-12-23 DIAGNOSIS — F418 Other specified anxiety disorders: Secondary | ICD-10-CM

## 2020-12-23 DIAGNOSIS — Z0000 Encounter for general adult medical examination without abnormal findings: Secondary | ICD-10-CM | POA: Insufficient documentation

## 2020-12-23 DIAGNOSIS — Z23 Encounter for immunization: Secondary | ICD-10-CM

## 2020-12-23 DIAGNOSIS — R7303 Prediabetes: Secondary | ICD-10-CM

## 2020-12-23 DIAGNOSIS — Z1231 Encounter for screening mammogram for malignant neoplasm of breast: Secondary | ICD-10-CM | POA: Diagnosis not present

## 2020-12-23 DIAGNOSIS — F419 Anxiety disorder, unspecified: Secondary | ICD-10-CM

## 2020-12-23 DIAGNOSIS — F5104 Psychophysiologic insomnia: Secondary | ICD-10-CM | POA: Diagnosis not present

## 2020-12-23 MED ORDER — SERTRALINE HCL 50 MG PO TABS: 50.0000 mg | ORAL_TABLET | Freq: Every day | ORAL | 3 refills | Status: DC

## 2020-12-23 MED ORDER — ALPRAZOLAM 0.25 MG PO TABS: 0.2500 mg | ORAL_TABLET | Freq: Two times a day (BID) | ORAL | 0 refills | Status: DC | PRN

## 2020-12-23 NOTE — Assessment & Plan Note (Signed)
Chronic and stable Recheck A1c

## 2020-12-23 NOTE — Assessment & Plan Note (Signed)
Chronic and stable Continue triamterene-HCTZ Recheck CMP

## 2020-12-23 NOTE — Progress Notes (Deleted)
Established patient visit   Patient: Krista Cooper   DOB: 10/15/1955   65 y.o. Female  MRN: [id number] Visit Date: 12/23/2020  Today's healthcare provider: Shirlee Latch, MD   No chief complaint on file.  Subjective    HPI  Hypertension, follow-up  BP Readings from Last 3 Encounters:  10/13/19 (!) 146/84  04/03/19 140/80  09/30/18 (!) 149/83   Wt Readings from Last 3 Encounters:  10/13/19 180 lb 9.6 oz (81.9 kg)  09/30/18 191 lb 3.2 oz (86.7 kg)  03/28/18 191 lb 6.4 oz (86.8 kg)     She was last seen for hypertension 10/13/2019.  BP at that visit was 146/84. Management since that visit includes; Stable on Maxzide. She reports {excellent/good/fair/poor:19665} compliance with treatment. She {is/is not:9024} having side effects. {document side effects if present:1} She {is/is not:9024} exercising. She {is/is not:9024} adherent to low salt diet.   Outside blood pressures are {enter patient reported home BP, or 'not being checked':1}.  She {does/does not:200015} smoke.  Use of agents associated with hypertension: {bp agents assoc with hypertension:511}.   --------------------------------------------------------------------------------------------------- Lipid/Cholesterol, follow-up  Last Lipid Panel: Lab Results  Component Value Date   CHOL 193 10/13/2019   LDLCALC 106 (H) 10/13/2019   HDL 73 10/13/2019   TRIG 77 10/13/2019    She was last seen for this 10/13/2019.  Management since that visit includes; on simvastatin.  She reports {excellent/good/fair/poor:19665} compliance with treatment. She {is/is not:9024} having side effects. {document side effects if present:1}  She is following a {diet:21022986} diet. Current exercise: {exercise types:16438}  Last metabolic panel Lab Results  Component Value Date   GLUCOSE 111 (H) 10/13/2019   NA 136 10/13/2019   K 3.7 10/13/2019   BUN 16 10/13/2019   CREATININE 0.59 10/13/2019   GFRNONAA 98 10/13/2019    GFRAA 113 10/13/2019   CALCIUM 9.6 10/13/2019   AST 33 10/13/2019   ALT 29 10/13/2019   The ASCVD Risk score Denman George DC Jr., et al., 2013) failed to calculate for the following reasons:   The systolic blood pressure is missing  --------------------------------------------------------------------------------------------------- Prediabetes, Follow-up  Lab Results  Component Value Date   HGBA1C 6.0 (H) 10/13/2019   HGBA1C 6.1 (H) 09/30/2018   HGBA1C 6.0 (H) 10/14/2017   GLUCOSE 111 (H) 10/13/2019   GLUCOSE 101 (H) 12/12/2018   GLUCOSE 106 (H) 09/30/2018    Last seen for for this 10/13/2019.  Management since that visit includes; labs checked showing-Sugar borderline high but ok, limit carbs and sugars in diet.  Current symptoms include {Symptoms; diabetes:14075} and have been {Desc; course:15616}.  Prior visit with dietician: {yes/no:17258} Current diet: {diet habits:16563} Current exercise: {exercise types:16438}  Pertinent Labs:    Component Value Date/Time   CHOL 193 10/13/2019 1003   TRIG 77 10/13/2019 1003   CHOLHDL 2.6 10/13/2019 1003   CREATININE 0.59 10/13/2019 1003    Wt Readings from Last 3 Encounters:  10/13/19 180 lb 9.6 oz (81.9 kg)  09/30/18 191 lb 3.2 oz (86.7 kg)  03/28/18 191 lb 6.4 oz (86.8 kg)    -----------------------------------------------------------------------------------------   {Show patient history (optional):23778}   Medications: Outpatient Medications Prior to Visit  Medication Sig   ALPRAZolam (XANAX) 0.25 MG tablet Take 1 tablet (0.25 mg total) by mouth 2 (two) times daily as needed for anxiety.   aspirin 81 MG tablet Take 1 tablet by mouth daily.   B Complex Vitamins (VITAMIN B COMPLEX PO) Take 1 tablet by mouth daily.   Cholecalciferol  10000 UNITS CAPS Take 2 capsules by mouth daily.    fluticasone (FLONASE) 50 MCG/ACT nasal spray USE 2 SPRAYS IN EACH       NOSTRIL DAILY   levocetirizine (XYZAL) 5 MG tablet TAKE 1 TABLET BY  MOUTH ONCE DAILY IN THE EVENING   MULTIPLE VITAMIN PO Take 1 tablet by mouth daily.   simvastatin (ZOCOR) 20 MG tablet TAKE 1 TABLET BY MOUTH AT BEDTIME   triamterene-hydrochlorothiazide (MAXZIDE-25) 37.5-25 MG tablet TAKE 1 TABLET BY MOUTH ONCE DAILY -  NEED  APPT  FOR  FURTHER  REFILLS   No facility-administered medications prior to visit.    Review of Systems  Constitutional:  Negative for appetite change, chills, fatigue and fever.  Respiratory:  Negative for chest tightness and shortness of breath.   Cardiovascular:  Negative for chest pain and palpitations.  Gastrointestinal:  Negative for abdominal pain, nausea and vomiting.  Neurological:  Negative for dizziness and weakness.   {Labs  Heme  Chem  Endocrine  Serology  Results Review (optional):23779}   Objective    There were no vitals taken for this visit. {Show previous vital signs (optional):23777}   Physical Exam  ***  No results found for any visits on 12/23/20.  Assessment & Plan     ***  No follow-ups on file.      {provider attestation***:1}   Shirlee Latch, MD  [facility] [phone] (phone) [phone] (fax)  [facility]

## 2020-12-23 NOTE — Assessment & Plan Note (Signed)
Poorly controlled Start Zoloft

## 2020-12-23 NOTE — Assessment & Plan Note (Addendum)
Pt. requests Pneumovax today Recheck CMP, lipid panel, A1c, Vit D Refer to [facility]

## 2020-12-23 NOTE — Assessment & Plan Note (Signed)
Recheck Vit D 

## 2020-12-23 NOTE — Assessment & Plan Note (Addendum)
Chronic and stable Recheck lipid panel today Recommended CoQ10 prn for myalgias Discontinue daily ASA

## 2020-12-23 NOTE — Progress Notes (Signed)
Established patient visit   Patient: Krista Cooper   DOB: 18-May-1956   65 y.o. Female  MRN: [id number] Visit Date: 12/23/2020  Today's healthcare provider: Shirlee Latch, MD   Chief Complaint  Patient presents with   Hypertension   Anxiety   Subjective    HPI  Anxiety - Pt. requests RF of Xanax - pt. states that she hasn't used it in 2-3 mos - Was previously using Xanax 1x/day after husband passed away, but feels that anxiety is better controlled now - Pt. states that she's never used an SSRI, but would be interested in starting a medication for baseline anxiety control - Endorses chronic sleep difficulties approx 2x/week  Elevated BP - Currently on triamterene-HCTZ; denies medication side effects - Pt. states that BP ranges 125-130/70-80 at home  Hyperlipidemia - Currently on simvastatin; endorses occasional myalgias, but feels that they're "manageable"  Health Maintenance - Pt. requests pneumovax today - Pt. voices that she would like assistance with weight loss     Medications: Outpatient Medications Prior to Visit  Medication Sig   B Complex Vitamins (VITAMIN B COMPLEX PO) Take 1 tablet by mouth daily.   Cholecalciferol (VITAMIN D3) 25 MCG (1000 UT) CAPS Take 1 capsule by mouth daily.   fluticasone (FLONASE) 50 MCG/ACT nasal spray USE 2 SPRAYS IN EACH       NOSTRIL DAILY   levocetirizine (XYZAL) 5 MG tablet TAKE 1 TABLET BY MOUTH ONCE DAILY IN THE EVENING   MULTIPLE VITAMIN PO Take 1 tablet by mouth daily.   Omega-3 Fatty Acids (FISH OIL) 1200 MG CAPS Take 2 capsules by mouth daily.   simvastatin (ZOCOR) 20 MG tablet TAKE 1 TABLET BY MOUTH AT BEDTIME   triamterene-hydrochlorothiazide (MAXZIDE-25) 37.5-25 MG tablet TAKE 1 TABLET BY MOUTH ONCE DAILY -  NEED  APPT  FOR  FURTHER  REFILLS   [DISCONTINUED] ALPRAZolam (XANAX) 0.25 MG tablet Take 1 tablet (0.25 mg total) by mouth 2 (two) times daily as needed for anxiety.   [DISCONTINUED] aspirin 81 MG tablet  Take 1 tablet by mouth daily.   [DISCONTINUED] Cholecalciferol 10000 UNITS CAPS Take 2 capsules by mouth daily.  (Patient not taking: Reported on 12/23/2020)   No facility-administered medications prior to visit.    Review of Systems  Constitutional: Negative.  Negative for activity change, chills, fatigue and fever.  HENT: Negative.    Eyes: Negative.   Respiratory: Negative.  Negative for chest tightness and shortness of breath.   Cardiovascular:  Negative for chest pain, palpitations and leg swelling.  Gastrointestinal: Negative.   Endocrine: Negative.   Genitourinary: Negative.   Musculoskeletal: Negative.  Negative for myalgias.  Skin: Negative.   Allergic/Immunologic: Negative.   Neurological: Negative.   Psychiatric/Behavioral:  The patient is nervous/anxious.       Objective    BP (!) 144/80   Pulse (!) 105   Temp 97.7 F (36.5 C) (Temporal)   Resp 18   Wt 203 lb 3.2 oz (92.2 kg)   BMI 32.80 kg/m     Physical Exam Constitutional:      Appearance: Normal appearance. She is normal weight.  HENT:     Head: Normocephalic and atraumatic.     Right Ear: External ear normal.     Left Ear: External ear normal.  Eyes:     Conjunctiva/sclera: Conjunctivae normal.  Cardiovascular:     Rate and Rhythm: Normal rate and regular rhythm.     Pulses: Normal pulses.  Heart sounds: Normal heart sounds.  Pulmonary:     Effort: Pulmonary effort is normal.     Breath sounds: Normal breath sounds.  Abdominal:     General: Abdomen is flat.     Palpations: Abdomen is soft.  Musculoskeletal:     Cervical back: Normal range of motion and neck supple.     Right lower leg: No edema.     Left lower leg: No edema.  Skin:    General: Skin is warm and dry.  Neurological:     Mental Status: She is alert.  Psychiatric:        Behavior: Behavior normal.        Thought Content: Thought content normal.     No results found for any visits on 12/23/20.  Assessment & Plan      Problem List Items Addressed This Visit       Cardiovascular and Mediastinum   BP (high blood pressure)    Chronic and stable Continue triamterene-HCTZ Recheck CMP       Relevant Orders   Comprehensive metabolic panel     Other   Anxiety - Primary    Chronic, improving Continue Xanax prn for anxiety attacks Start Zoloft       Relevant Medications   ALPRAZolam (XANAX) 0.25 MG tablet   sertraline (ZOLOFT) 50 MG tablet   Hypercholesteremia    Chronic and stable Recheck lipid panel today Recommended CoQ10 prn for myalgias Discontinue daily ASA       Relevant Orders   Comprehensive metabolic panel   Lipid panel   Cannot sleep    Poorly controlled Start Zoloft       Borderline diabetes    Chronic and stable Recheck A1c       Relevant Orders   Hemoglobin A1c   Avitaminosis D    Recheck Vit D       Relevant Orders   VITAMIN D 25 Hydroxy (Vit-D Deficiency, Fractures)         Other Visit Diagnoses     Need for vaccination against Streptococcus pneumoniae       Relevant Orders   Pneumococcal conjugate vaccine 20-valent (PCV20) (Preferred)   Situational anxiety       Chronic, improving   Relevant Medications   ALPRAZolam (XANAX) 0.25 MG tablet   sertraline (ZOLOFT) 50 MG tablet   Class 1 obesity without serious comorbidity with body mass index (BMI) of 32.0 to 32.9 in adult, unspecified obesity type       Refer to [facility] Check CMP, A1c, lipid panel        Return in about 2 months (around 02/22/2021) for CPE, as scheduled.        Queen Blossom, MS3   Patient seen along with MS3 student Queen Blossom. I personally evaluated this patient along with the student, and verified all aspects of the history, physical exam, and medical decision making as documented by the student. I agree with the student's documentation and have made all necessary edits.  Aubrina Nieman, [name], MD, MPH [facility]

## 2020-12-23 NOTE — Patient Instructions (Signed)
CoQ10 100-200 mg daily  Ok to stop baby aspirin

## 2020-12-23 NOTE — Assessment & Plan Note (Signed)
Chronic, improving Continue Xanax prn for anxiety attacks Start Zoloft

## 2020-12-31 ENCOUNTER — Other Ambulatory Visit: Payer: Self-pay

## 2020-12-31 DIAGNOSIS — I10 Essential (primary) hypertension: Secondary | ICD-10-CM

## 2020-12-31 MED ORDER — TRIAMTERENE-HCTZ 37.5-25 MG PO TABS: ORAL_TABLET | ORAL | 0 refills | Status: DC

## 2020-12-31 NOTE — Telephone Encounter (Signed)
Pt called to report that she wants a refill called in for her so it could be ready when she runs out of her current supply.  [facility] [address] [place] - [address]  [address] [place] [address]  Phone: [phone] Fax: [phone]

## 2021-01-20 ENCOUNTER — Other Ambulatory Visit: Payer: Self-pay | Admitting: Family Medicine

## 2021-01-20 DIAGNOSIS — I10 Essential (primary) hypertension: Secondary | ICD-10-CM

## 2021-02-02 ENCOUNTER — Other Ambulatory Visit: Payer: Self-pay | Admitting: Family Medicine

## 2021-02-02 DIAGNOSIS — I10 Essential (primary) hypertension: Secondary | ICD-10-CM

## 2021-02-03 NOTE — Telephone Encounter (Signed)
Requested by interface surescripts. Last refill 12/31/20 due in 04/02/21. Last labs 10/13/19

## 2021-02-20 DIAGNOSIS — I10 Essential (primary) hypertension: Secondary | ICD-10-CM | POA: Diagnosis not present

## 2021-02-20 DIAGNOSIS — E559 Vitamin D deficiency, unspecified: Secondary | ICD-10-CM | POA: Diagnosis not present

## 2021-02-20 DIAGNOSIS — E7800 Pure hypercholesterolemia, unspecified: Secondary | ICD-10-CM | POA: Diagnosis not present

## 2021-02-20 DIAGNOSIS — R7303 Prediabetes: Secondary | ICD-10-CM | POA: Diagnosis not present

## 2021-02-21 LAB — COMPREHENSIVE METABOLIC PANEL
ALT: 26 IU/L (ref 0–32)
AST: 27 IU/L (ref 0–40)
Albumin/Globulin Ratio: 2.3 — ABNORMAL HIGH (ref 1.2–2.2)
Albumin: 4.5 g/dL (ref 3.8–4.8)
Alkaline Phosphatase: 80 IU/L (ref 44–121)
BUN/Creatinine Ratio: 23 (ref 12–28)
BUN: 15 mg/dL (ref 8–27)
Bilirubin Total: 0.6 mg/dL (ref 0.0–1.2)
CO2: 24 mmol/L (ref 20–29)
Calcium: 9.7 mg/dL (ref 8.7–10.3)
Chloride: 96 mmol/L (ref 96–106)
Creatinine, Ser: 0.66 mg/dL (ref 0.57–1.00)
Globulin, Total: 2 g/dL (ref 1.5–4.5)
Glucose: 118 mg/dL — ABNORMAL HIGH (ref 70–99)
Potassium: 4 mmol/L (ref 3.5–5.2)
Sodium: 135 mmol/L (ref 134–144)
Total Protein: 6.5 g/dL (ref 6.0–8.5)
eGFR: 97 mL/min/{1.73_m2} (ref >59)

## 2021-02-21 LAB — LIPID PANEL
Chol/HDL Ratio: 2.9 ratio (ref 0.0–4.4)
Cholesterol, Total: 203 mg/dL — ABNORMAL HIGH (ref 100–199)
HDL: 71 mg/dL (ref >39)
LDL Chol Calc (NIH): 119 mg/dL — ABNORMAL HIGH (ref 0–99)
Triglycerides: 72 mg/dL (ref 0–149)
VLDL Cholesterol Cal: 13 mg/dL (ref 5–40)

## 2021-02-21 LAB — VITAMIN D 25 HYDROXY (VIT D DEFICIENCY, FRACTURES): Vit D, 25-Hydroxy: 41.6 ng/mL (ref 30.0–100.0)

## 2021-02-21 LAB — HEMOGLOBIN A1C
Est. average glucose Bld gHb Est-mCnc: 134 mg/dL
Hgb A1c MFr Bld: 6.3 % — ABNORMAL HIGH (ref 4.8–5.6)

## 2021-02-22 ENCOUNTER — Other Ambulatory Visit: Payer: Self-pay | Admitting: Physician Assistant

## 2021-02-22 DIAGNOSIS — E7800 Pure hypercholesterolemia, unspecified: Secondary | ICD-10-CM

## 2021-02-23 ENCOUNTER — Other Ambulatory Visit: Payer: Self-pay | Admitting: Family Medicine

## 2021-02-23 DIAGNOSIS — I10 Essential (primary) hypertension: Secondary | ICD-10-CM

## 2021-02-24 NOTE — Telephone Encounter (Signed)
Pharmacy was called earlier- advised can not fill- too early

## 2021-02-24 NOTE — Telephone Encounter (Signed)
Call to pharmacy verified Rx filled 12/31/20 #90- pharmacy states automatic request because no RF- advised too early- unable to fill. Requested Prescriptions  Pending Prescriptions Disp Refills  . triamterene-hydrochlorothiazide (MAXZIDE-25) 37.5-25 MG tablet [Pharmacy Med Name: Triamterene-HCTZ 37.5-25 MG Oral Tablet] 30 tablet 0    Sig: Take 1 tablet by mouth once daily     Cardiovascular: Diuretic Combos Failed - 02/23/2021 11:38 PM      Failed - Last BP in normal range    BP Readings from Last 1 Encounters:  12/23/20 (!) 144/80         Passed - K in normal range and within 360 days    Potassium  Date Value Ref Range Status  02/20/2021 4.0 3.5 - 5.2 mmol/L Final         Passed - Na in normal range and within 360 days    Sodium  Date Value Ref Range Status  02/20/2021 135 134 - 144 mmol/L Final         Passed - Cr in normal range and within 360 days    Creatinine, Ser  Date Value Ref Range Status  02/20/2021 0.66 0.57 - 1.00 mg/dL Final         Passed - Ca in normal range and within 360 days    Calcium  Date Value Ref Range Status  02/20/2021 9.7 8.7 - 10.3 mg/dL Final         Passed - Valid encounter within last 6 months    Recent Outpatient Visits          2 months ago Anxiety   [facility], [name], [place]   1 year ago Routine general medical examination at a health care facility   [facility] [place], [name], [place]   1 year ago Essential hypertension   [facility] [place], [name], [place]   2 years ago Annual physical exam   [facility] [place], [place]   2 years ago White coat syndrome without diagnosis of hypertension   [facility], [name], [place]      Future Appointments            In 3 days Bacigalupo, [name], MD [facility]

## 2021-02-27 ENCOUNTER — Encounter: Payer: Medicare Other | Admitting: Family Medicine

## 2021-03-07 ENCOUNTER — Ambulatory Visit (INDEPENDENT_AMBULATORY_CARE_PROVIDER_SITE_OTHER): Payer: Medicare Other | Admitting: Family Medicine

## 2021-03-07 ENCOUNTER — Encounter: Payer: Self-pay | Admitting: Family Medicine

## 2021-03-07 ENCOUNTER — Other Ambulatory Visit: Payer: Self-pay

## 2021-03-07 VITALS — BP 132/70 | HR 78 | Temp 98.9°F | Ht 66.0 in | Wt 199.2 lb

## 2021-03-07 DIAGNOSIS — Z0000 Encounter for general adult medical examination without abnormal findings: Secondary | ICD-10-CM

## 2021-03-07 DIAGNOSIS — Z780 Asymptomatic menopausal state: Secondary | ICD-10-CM | POA: Diagnosis not present

## 2021-03-07 DIAGNOSIS — Z23 Encounter for immunization: Secondary | ICD-10-CM | POA: Diagnosis not present

## 2021-03-07 MED ORDER — FLUTICASONE PROPIONATE 50 MCG/ACT NA SUSP: 2.0000 | Freq: Every day | NASAL | 1 refills | Status: DC

## 2021-03-07 NOTE — Progress Notes (Signed)
Welcome to Medicare Visit     Patient: Krista Cooper, Female    DOB: 01/09/1956, 65 y.o.   MRN: [id number] Visit Date: 03/07/2021  Today's Provider: Lavon Paganini, MD   Chief Complaint  Patient presents with   Annual Exam   Subjective    Krista Cooper is a 65 y.o. female who presents today for her Welcome to Medicare. She reports consuming a low sodium and low carb  diet.  Walks daily for 30 minutes and lifts light weights 2xs weekly for 20 minutes  She generally feels well. She reports sleeping well. She does not have additional problems to discuss today.   HPI -Patient would like flu vaccine -Pap Smear and Mammogram UTD -DEXA Scan needed ---------------------------------------------------------------------------------------------------    Medications: Outpatient Medications Prior to Visit  Medication Sig   ALPRAZolam (XANAX) 0.25 MG tablet Take 1 tablet (0.25 mg total) by mouth 2 (two) times daily as needed for anxiety.   B Complex Vitamins (VITAMIN B COMPLEX PO) Take 1 tablet by mouth daily.   Cholecalciferol (VITAMIN D3) 25 MCG (1000 UT) CAPS Take 1 capsule by mouth in the morning and at bedtime.   Coenzyme Q10 (COQ10) 200 MG CAPS Take 200 mg by mouth daily with breakfast.   levocetirizine (XYZAL) 5 MG tablet TAKE 1 TABLET BY MOUTH ONCE DAILY IN THE EVENING   MULTIPLE VITAMIN PO Take 1 tablet by mouth daily.   Omega-3 Fatty Acids (FISH OIL) 1200 MG CAPS Take 2 capsules by mouth daily.   sertraline (ZOLOFT) 50 MG tablet Take 1 tablet (50 mg total) by mouth daily.   simvastatin (ZOCOR) 20 MG tablet TAKE 1 TABLET BY MOUTH AT BEDTIME   triamterene-hydrochlorothiazide (MAXZIDE-25) 37.5-25 MG tablet TAKE 1 TABLET BY MOUTH ONCE DAILY   [DISCONTINUED] fluticasone (FLONASE) 50 MCG/ACT nasal spray USE 2 SPRAYS IN EACH       NOSTRIL DAILY   No facility-administered medications prior to visit.    Allergies  Allergen Reactions   Codeine     Itching; Can take Cough  Syrup   Lisinopril Cough    Patient Care Team: Virginia Crews, MD as PCP - General (Family Medicine)  Review of Systems  HENT:  Positive for postnasal drip and sneezing.   Musculoskeletal:  Positive for neck stiffness.  All other systems reviewed and are negative.  Last CBC Lab Results  Component Value Date   WBC 6.0 10/13/2019   HGB 13.5 10/13/2019   HCT 40.7 10/13/2019   MCV 86 10/13/2019   MCH 28.7 10/13/2019   RDW 13.1 10/13/2019   PLT 346 63/05/6008   Last metabolic panel Lab Results  Component Value Date   GLUCOSE 118 (H) 02/20/2021   NA 135 02/20/2021   K 4.0 02/20/2021   CL 96 02/20/2021   CO2 24 02/20/2021   BUN 15 02/20/2021   CREATININE 0.66 02/20/2021   EGFR 97 02/20/2021   CALCIUM 9.7 02/20/2021   PROT 6.5 02/20/2021   ALBUMIN 4.5 02/20/2021   LABGLOB 2.0 02/20/2021   AGRATIO 2.3 (H) 02/20/2021   BILITOT 0.6 02/20/2021   ALKPHOS 80 02/20/2021   AST 27 02/20/2021   ALT 26 02/20/2021   Last lipids Lab Results  Component Value Date   CHOL 203 (H) 02/20/2021   HDL 71 02/20/2021   LDLCALC 119 (H) 02/20/2021   TRIG 72 02/20/2021   CHOLHDL 2.9 02/20/2021   Last hemoglobin A1c Lab Results  Component Value Date   HGBA1C 6.3 (H)  02/20/2021   Last thyroid functions Lab Results  Component Value Date   TSH 1.800 10/13/2019   Last vitamin D Lab Results  Component Value Date   VD25OH 41.6 02/20/2021   Last vitamin B12 and Folate No results found for: VITAMINB12, FOLATE      Objective    Vitals: BP 132/70 Comment: home reading  Pulse 78   Temp 98.9 F (37.2 C) (Oral)   Ht _0  (1.676 m)   Wt 199 lb 3.2 oz (90.4 kg)   SpO2 98%   BMI 32.15 kg/m  BP Readings from Last 3 Encounters:  03/07/21 132/70  12/23/20 (!) 144/80  10/13/19 (!) 146/84   Wt Readings from Last 3 Encounters:  03/07/21 199 lb 3.2 oz (90.4 kg)  12/23/20 203 lb 3.2 oz (92.2 kg)  10/13/19 180 lb 9.6 oz (81.9 kg)      Physical Exam Vitals reviewed.   Constitutional:      General: She is not in acute distress.    Appearance: Normal appearance. She is well-developed. She is not diaphoretic.  HENT:     Head: Normocephalic and atraumatic.     Right Ear: Tympanic membrane, ear canal and external ear normal.     Left Ear: Tympanic membrane, ear canal and external ear normal.     Nose: Nose normal.     Mouth/Throat:     Mouth: Mucous membranes are moist.     Pharynx: Oropharynx is clear. No oropharyngeal exudate.  Eyes:     General: No scleral icterus.    Conjunctiva/sclera: Conjunctivae normal.     Pupils: Pupils are equal, round, and reactive to light.  Neck:     Thyroid: No thyromegaly.  Cardiovascular:     Rate and Rhythm: Normal rate and regular rhythm.     Pulses: Normal pulses.     Heart sounds: Normal heart sounds. No murmur heard. Pulmonary:     Effort: Pulmonary effort is normal. No respiratory distress.     Breath sounds: Normal breath sounds. No wheezing or rales.  Abdominal:     General: There is no distension.     Palpations: Abdomen is soft.     Tenderness: There is no abdominal tenderness.  Musculoskeletal:        General: No deformity.     Cervical back: Neck supple.     Right lower leg: No edema.     Left lower leg: No edema.  Lymphadenopathy:     Cervical: No cervical adenopathy.  Skin:    General: Skin is warm and dry.     Findings: No rash.  Neurological:     Mental Status: She is alert and oriented to person, place, and time. Mental status is at baseline.     Sensory: No sensory deficit.     Motor: No weakness.     Gait: Gait normal.  Psychiatric:        Mood and Affect: Mood normal.        Behavior: Behavior normal.        Thought Content: Thought content normal.     Most recent functional status assessment: In your present state of health, do you have any difficulty performing the following activities: 03/07/2021  Hearing? N  Vision? N  Difficulty concentrating or making decisions? N   Walking or climbing stairs? N  Dressing or bathing? N  Doing errands, shopping? N  Some recent data might be hidden   Most recent fall risk assessment: Fall Risk  03/07/2021  Falls in the past year? 0  Number falls in past yr: 0  Injury with Fall? 0  Risk for fall due to : No Fall Risks  Follow up -    Most recent depression screenings: PHQ 2/9 Scores 03/07/2021 12/23/2020  PHQ - 2 Score 0 0  PHQ- 9 Score 1 1   Most recent cognitive screening: No flowsheet data found. Most recent Audit-C alcohol use screening Alcohol Use Disorder Test (AUDIT) 03/07/2021  1. How often do you have a drink containing alcohol? 0  2. How many drinks containing alcohol do you have on a typical day when you are drinking? 0  3. How often do you have six or more drinks on one occasion? 0  AUDIT-C Score 0  Alcohol Brief Interventions/Follow-up -   A score of 3 or more in women, and 4 or more in men indicates increased risk for alcohol abuse, EXCEPT if all of the points are from question 1    EKG NSR   No results found for any visits on 03/07/21.  Assessment & Plan     Welcome to Medicare done today including the all of the following: Reviewed patient's Family Medical History Reviewed and updated list of patient's medical providers Assessment of cognitive impairment was done Assessed patient's functional ability Established a written schedule for health screening [place] Completed and Reviewed  Exercise Activities and Dietary recommendations  Goals   None     Immunization History  Administered Date(s) Administered   Fluad Quad(high Dose 65+) 03/07/2021   Influenza,inj,Quad PF,6+ Mos 02/02/2014, 02/15/2015, 03/19/2016, 03/28/2018, 04/03/2019, 04/03/2020   Influenza-Unspecified 03/16/2017   PFIZER Comirnaty(Gray Top)Covid-19 Tri-Sucrose Vaccine 05/27/2020, 09/27/2020, 02/10/2021   PFIZER(Purple Top)SARS-COV-2 Vaccination 11/06/2019, 11/27/2019   PNEUMOCOCCAL  CONJUGATE-20 12/23/2020   Td 03/19/2016   Tdap 10/15/2005   Zoster Recombinat (Shingrix) 09/30/2018, 12/12/2018    Health Maintenance  Topic Date Due   DEXA SCAN  Never done   PAP SMEAR-Modifier  10/13/2022   Fecal DNA (Cologuard)  10/23/2022   MAMMOGRAM  12/24/2022   TETANUS/TDAP  03/19/2026   Pneumonia Vaccine 60+ Years old  Completed   INFLUENZA VACCINE  Completed   COVID-19 Vaccine  Completed   Hepatitis C Screening  Completed   HIV Screening  Completed   Zoster Vaccines- Shingrix  Completed   HPV VACCINES  Aged Out     Discussed health benefits of physical activity, and encouraged her to engage in regular exercise appropriate for her age and condition.    Problem List Items Addressed This Visit   None Visit Diagnoses     Welcome to Medicare preventive visit    -  Primary   Relevant Orders   EKG 12-Lead (Completed)   Need for immunization against influenza       Relevant Orders   Flu Vaccine QUAD High Dose(Fluad) (Completed)   Postmenopausal       Relevant Orders   DG Bone Density        Return in about 6 months (around 09/05/2021) for chronic disease f/u.     I, Lavon Paganini, MD, have reviewed all documentation for this visit. The documentation on 03/07/21 for the exam, diagnosis, procedures, and orders are all accurate and complete.   Jazmyn Offner, Dionne Bucy, MD, MPH [facility]

## 2021-03-11 ENCOUNTER — Other Ambulatory Visit: Payer: Self-pay

## 2021-03-11 ENCOUNTER — Telehealth: Payer: Self-pay

## 2021-03-11 MED ORDER — PAXLOVID (300/100) 20 X 150 MG & 10 X 100MG PO TBPK
ORAL_TABLET | ORAL | 0 refills | Status: DC
Start: 2021-03-11 — End: 2021-06-24
  Filled 2021-03-11: qty 30, 5d supply, fill #0

## 2021-03-11 NOTE — Telephone Encounter (Signed)
Patient called to let you know she tested positive for covid and was here Friday for her CPE.

## 2021-03-11 NOTE — Telephone Encounter (Signed)
Patient advised as below. Gave phone number to [facility] pharmacy [phone]

## 2021-03-11 NOTE — Telephone Encounter (Signed)
Please thank her for letting us know.  Staff and other patient exposure should be minimal if they were masking.  Offer her the [facility] phone number to be screened for paxlovid if she would like.

## 2021-03-12 ENCOUNTER — Other Ambulatory Visit: Payer: Self-pay

## 2021-03-12 ENCOUNTER — Telehealth: Payer: Self-pay

## 2021-03-12 NOTE — Telephone Encounter (Signed)
Outpatient Pharmacy Oral COVID Treatment Note  I connected with Krista Cooper on 03/12/2021/10:45 AM by telephone and verified that I am speaking with the correct person using two identifiers.  I discussed the limitations, risks, security, and privacy concerns of performing an evaluation and management service by telephone and the availability of in person appointments via referral to a physician. The patient expressed understanding and agreed to proceed.  Pharmacy location: [place]   Diagnosis: COVID-19 infection  Purpose of visit: Discussion of potential use of Paxlovid, a new treatment for mild to moderate COVID-19 viral infection in non-hospitalized patients.  Subjective/Objective: Patient is a 65 y.o. female who is presenting with COVID 19 viral infection.  COVID 19 viral infection. Their symptoms began on 03/08/21 with cold symptoms and sore throat.  The patient has confirmed COVID-19 via a home test on 03/11/21.   Past Medical History:  Diagnosis Date   Allergy    Anxiety    Hyperlipidemia    Hypertension      Allergies  Allergen Reactions   Codeine     Itching; Can take Cough Syrup   Lisinopril Cough     Current Outpatient Medications:    ALPRAZolam (XANAX) 0.25 MG tablet, Take 1 tablet (0.25 mg total) by mouth 2 (two) times daily as needed for anxiety., Disp: 10 tablet, Rfl: 0   B Complex Vitamins (VITAMIN B COMPLEX PO), Take 1 tablet by mouth daily., Disp: , Rfl:    Cholecalciferol (VITAMIN D3) 25 MCG (1000 UT) CAPS, Take 1 capsule by mouth in the morning and at bedtime., Disp: , Rfl:    Coenzyme Q10 (COQ10) 200 MG CAPS, Take 200 mg by mouth daily with breakfast., Disp: , Rfl:    levocetirizine (XYZAL) 5 MG tablet, TAKE 1 TABLET BY MOUTH ONCE DAILY IN THE EVENING, Disp: 90 tablet, Rfl: 0   MULTIPLE VITAMIN PO, Take 1 tablet by mouth daily., Disp: , Rfl:    nirmatrelvir & ritonavir (PAXLOVID, 300/100,) 20 x 150 MG & 10 x 100MG TBPK, Take 2  nirmatrelvir (150 mg) tablets and take 1 ritonavir (100 mg) tablet (3 tablets total) by mouth two times daily for 5 days, Disp: 30 tablet, Rfl: 0   Omega-3 Fatty Acids (FISH OIL) 1200 MG CAPS, Take 2 capsules by mouth daily., Disp: , Rfl:    sertraline (ZOLOFT) 50 MG tablet, Take 1 tablet (50 mg total) by mouth daily., Disp: 30 tablet, Rfl: 3   simvastatin (ZOCOR) 20 MG tablet, TAKE 1 TABLET BY MOUTH AT BEDTIME, Disp: 90 tablet, Rfl: 0   triamterene-hydrochlorothiazide (MAXZIDE-25) 37.5-25 MG tablet, TAKE 1 TABLET BY MOUTH ONCE DAILY, Disp: 90 tablet, Rfl: 0  Lab Monitoring: eGFR 97 on 02/20/21  Drug Interactions Noted: Patient will stop fluticasone nasal spray and alprazolam while taking Paxlovid. Patient will also stop simvastatin while taking Paxlovid and for 5 days after completion of course.   Plan:  This patient is a 65 y.o. female that meets the criteria for Emergency Use Authorization of Paxlovid. After reviewing the emergency use authorization with the patient, the patient agrees to receive Paxlovid.  Through FDA guidance and current [employer] standing order Paxlovid will be prescribed to the patient.   Patient contacted for counseling on 03/11/21 and verbalized understanding.   Delivery or Pick-Up Date: 03/11/21  Follow up instructions:    Take prescription BID x 5 days as directed Counseling was provided by pharmacist. Reach out to pharmacist with follow up questions For concerns regarding  further COVID symptoms please follow up with your PCP or urgent care For urgent or life-threatening issues, seek care at your local emergency department   [name] 03/12/2021, 10:45 AM [facility] Pharmacist Phone# [phone]

## 2021-03-20 DIAGNOSIS — H2513 Age-related nuclear cataract, bilateral: Secondary | ICD-10-CM | POA: Diagnosis not present

## 2021-03-20 DIAGNOSIS — H3554 Dystrophies primarily involving the retinal pigment epithelium: Secondary | ICD-10-CM | POA: Diagnosis not present

## 2021-03-24 ENCOUNTER — Other Ambulatory Visit: Payer: Self-pay | Admitting: Family Medicine

## 2021-03-24 DIAGNOSIS — I10 Essential (primary) hypertension: Secondary | ICD-10-CM

## 2021-03-24 DIAGNOSIS — J302 Other seasonal allergic rhinitis: Secondary | ICD-10-CM

## 2021-03-24 NOTE — Telephone Encounter (Signed)
Requested Prescriptions  Pending Prescriptions Disp Refills  . levocetirizine (XYZAL) 5 MG tablet [Pharmacy Med Name: Levocetirizine Dihydrochloride 5 MG Oral Tablet] 90 tablet 1    Sig: TAKE 1 TABLET BY MOUTH ONCE DAILY IN THE EVENING     Ear, Nose, and Throat:  Antihistamines Passed - 03/24/2021  6:45 PM      Passed - Valid encounter within last 12 months    Recent Outpatient Visits          2 weeks ago Welcome to [employer] preventive visit   [facility], [name], MD   3 months ago Anxiety   [facility] [place], [name], [place]   1 year ago Routine general medical examination at a health care facility   [facility] [place], [name], [place]   1 year ago Essential hypertension   [facility] [place], [name], [place]   2 years ago Annual physical exam   [facility] [place], [name], [place]      Future Appointments            In 5 months Bacigalupo, [name], MD [facility]           . triamterene-hydrochlorothiazide (MAXZIDE-25) 37.5-25 MG tablet [Pharmacy Med Name: Triamterene-HCTZ 37.5-25 MG Oral Tablet] 90 tablet 1    Sig: Take 1 tablet by mouth once daily     Cardiovascular: Diuretic Combos Passed - 03/24/2021  6:45 PM      Passed - K in normal range and within 360 days    Potassium  Date Value Ref Range Status  02/20/2021 4.0 3.5 - 5.2 mmol/L Final         Passed - Na in normal range and within 360 days    Sodium  Date Value Ref Range Status  02/20/2021 135 134 - 144 mmol/L Final         Passed - Cr in normal range and within 360 days    Creatinine, Ser  Date Value Ref Range Status  02/20/2021 0.66 0.57 - 1.00 mg/dL Final         Passed - Ca in normal range and within 360 days    Calcium  Date Value Ref Range Status  02/20/2021 9.7 8.7 - 10.3 mg/dL Final         Passed - Last BP in normal range    BP Readings from Last 1 Encounters:  03/07/21  132/70         Passed - Valid encounter within last 6 months    Recent Outpatient Visits          2 weeks ago Welcome to [employer] preventive visit   [facility], [name], MD   3 months ago Anxiety   [facility] [place], [name], MD   1 year ago Routine general medical examination at a health care facility   [facility] [place], [name], [place]   1 year ago Essential hypertension   [facility] [place], [name], [place]   2 years ago Annual physical exam   [facility] [place], [name], [place]      Future Appointments            In 5 months Bacigalupo, [name], MD [facility]

## 2021-04-03 ENCOUNTER — Ambulatory Visit
Admission: RE | Admit: 2021-04-03 | Discharge: 2021-04-03 | Disposition: A | Discharge status: Home / Self Care | Payer: Medicare Other | Source: Ambulatory Visit | Attending: Family Medicine | Admitting: Family Medicine

## 2021-04-03 ENCOUNTER — Other Ambulatory Visit: Payer: Federal, State, Local not specified - PPO

## 2021-04-03 ENCOUNTER — Other Ambulatory Visit: Payer: Self-pay

## 2021-04-03 DIAGNOSIS — Z780 Asymptomatic menopausal state: Secondary | ICD-10-CM | POA: Diagnosis not present

## 2021-04-26 ENCOUNTER — Other Ambulatory Visit: Payer: Self-pay | Admitting: Family Medicine

## 2021-04-26 NOTE — Telephone Encounter (Signed)
Requested Prescriptions  Pending Prescriptions Disp Refills  . sertraline (ZOLOFT) 50 MG tablet [Pharmacy Med Name: Sertraline HCl 50 MG Oral Tablet] 30 tablet 0    Sig: Take 1 tablet by mouth once daily     Psychiatry:  Antidepressants - SSRI Passed - 04/26/2021  9:08 AM      Passed - Valid encounter within last 6 months    Recent Outpatient Visits          1 month ago Welcome to [employer] preventive visit   [facility], [name], MD   4 months ago Anxiety   [facility] [place], [name], MD   1 year ago Routine general medical examination at a health care facility   [facility], [name], [place]   2 years ago Essential hypertension   [facility] [place], [name], [place]   2 years ago Annual physical exam   [facility] [place], [name], [place]      Future Appointments            In 4 months Bacigalupo, [name], MD [facility], [facility]

## 2021-05-12 DIAGNOSIS — R059 Cough, unspecified: Secondary | ICD-10-CM | POA: Diagnosis not present

## 2021-05-12 DIAGNOSIS — J069 Acute upper respiratory infection, unspecified: Secondary | ICD-10-CM | POA: Diagnosis not present

## 2021-05-19 ENCOUNTER — Other Ambulatory Visit: Payer: Self-pay | Admitting: Family Medicine

## 2021-05-19 DIAGNOSIS — E7800 Pure hypercholesterolemia, unspecified: Secondary | ICD-10-CM

## 2021-06-03 DIAGNOSIS — R0981 Nasal congestion: Secondary | ICD-10-CM | POA: Diagnosis not present

## 2021-06-03 DIAGNOSIS — J069 Acute upper respiratory infection, unspecified: Secondary | ICD-10-CM | POA: Diagnosis not present

## 2021-06-16 DIAGNOSIS — Z0289 Encounter for other administrative examinations: Secondary | ICD-10-CM

## 2021-06-24 ENCOUNTER — Encounter (INDEPENDENT_AMBULATORY_CARE_PROVIDER_SITE_OTHER): Payer: Self-pay | Admitting: Family Medicine

## 2021-06-24 ENCOUNTER — Ambulatory Visit (INDEPENDENT_AMBULATORY_CARE_PROVIDER_SITE_OTHER): Payer: Medicare Other | Admitting: Family Medicine

## 2021-06-24 ENCOUNTER — Other Ambulatory Visit: Payer: Self-pay

## 2021-06-24 VITALS — BP 151/79 | HR 85 | Temp 97.9°F | Ht 66.0 in | Wt 202.0 lb

## 2021-06-24 DIAGNOSIS — E7849 Other hyperlipidemia: Secondary | ICD-10-CM | POA: Diagnosis not present

## 2021-06-24 DIAGNOSIS — R0602 Shortness of breath: Secondary | ICD-10-CM | POA: Diagnosis not present

## 2021-06-24 DIAGNOSIS — Z6832 Body mass index (BMI) 32.0-32.9, adult: Secondary | ICD-10-CM

## 2021-06-24 DIAGNOSIS — E559 Vitamin D deficiency, unspecified: Secondary | ICD-10-CM

## 2021-06-24 DIAGNOSIS — Z1331 Encounter for screening for depression: Secondary | ICD-10-CM | POA: Diagnosis not present

## 2021-06-24 DIAGNOSIS — E669 Obesity, unspecified: Secondary | ICD-10-CM

## 2021-06-24 DIAGNOSIS — I10 Essential (primary) hypertension: Secondary | ICD-10-CM

## 2021-06-24 DIAGNOSIS — R5383 Other fatigue: Secondary | ICD-10-CM | POA: Diagnosis not present

## 2021-06-24 DIAGNOSIS — R739 Hyperglycemia, unspecified: Secondary | ICD-10-CM

## 2021-06-24 DIAGNOSIS — E7800 Pure hypercholesterolemia, unspecified: Secondary | ICD-10-CM | POA: Diagnosis not present

## 2021-06-24 DIAGNOSIS — F419 Anxiety disorder, unspecified: Secondary | ICD-10-CM

## 2021-06-25 LAB — TSH: TSH: 2.12 u[IU]/mL (ref 0.450–4.500)

## 2021-06-25 LAB — COMPREHENSIVE METABOLIC PANEL
ALT: 22 IU/L (ref 0–32)
AST: 26 IU/L (ref 0–40)
Albumin/Globulin Ratio: 2 (ref 1.2–2.2)
Albumin: 4.6 g/dL (ref 3.8–4.8)
Alkaline Phosphatase: 87 IU/L (ref 44–121)
BUN/Creatinine Ratio: 20 (ref 12–28)
BUN: 14 mg/dL (ref 8–27)
Bilirubin Total: 0.6 mg/dL (ref 0.0–1.2)
CO2: 25 mmol/L (ref 20–29)
Calcium: 9.5 mg/dL (ref 8.7–10.3)
Chloride: 94 mmol/L — ABNORMAL LOW (ref 96–106)
Creatinine, Ser: 0.7 mg/dL (ref 0.57–1.00)
Globulin, Total: 2.3 g/dL (ref 1.5–4.5)
Glucose: 108 mg/dL — ABNORMAL HIGH (ref 70–99)
Potassium: 4.4 mmol/L (ref 3.5–5.2)
Sodium: 134 mmol/L (ref 134–144)
Total Protein: 6.9 g/dL (ref 6.0–8.5)
eGFR: 96 mL/min/{1.73_m2} (ref >59)

## 2021-06-25 LAB — LIPID PANEL WITH LDL/HDL RATIO
Cholesterol, Total: 200 mg/dL — ABNORMAL HIGH (ref 100–199)
HDL: 78 mg/dL (ref >39)
LDL Chol Calc (NIH): 109 mg/dL — ABNORMAL HIGH (ref 0–99)
LDL/HDL Ratio: 1.4 ratio (ref 0.0–3.2)
Triglycerides: 73 mg/dL (ref 0–149)
VLDL Cholesterol Cal: 13 mg/dL (ref 5–40)

## 2021-06-25 LAB — INSULIN, RANDOM: INSULIN: 11.3 u[IU]/mL (ref 2.6–24.9)

## 2021-06-25 LAB — CBC WITH DIFFERENTIAL/PLATELET
Basophils Absolute: 0 10*3/uL (ref 0.0–0.2)
Basos: 0 %
EOS (ABSOLUTE): 0.1 10*3/uL (ref 0.0–0.4)
Eos: 1 %
Hematocrit: 41.5 % (ref 34.0–46.6)
Hemoglobin: 13.6 g/dL (ref 11.1–15.9)
Immature Grans (Abs): 0 10*3/uL (ref 0.0–0.1)
Immature Granulocytes: 0 %
Lymphocytes Absolute: 1.4 10*3/uL (ref 0.7–3.1)
Lymphs: 29 %
MCH: 28.9 pg (ref 26.6–33.0)
MCHC: 32.8 g/dL (ref 31.5–35.7)
MCV: 88 fL (ref 79–97)
Monocytes Absolute: 0.4 10*3/uL (ref 0.1–0.9)
Monocytes: 9 %
Neutrophils Absolute: 3 10*3/uL (ref 1.4–7.0)
Neutrophils: 61 %
Platelets: 373 10*3/uL (ref 150–450)
RBC: 4.71 x10E6/uL (ref 3.77–5.28)
RDW: 13.1 % (ref 11.7–15.4)
WBC: 4.9 10*3/uL (ref 3.4–10.8)

## 2021-06-25 LAB — VITAMIN D 25 HYDROXY (VIT D DEFICIENCY, FRACTURES): Vit D, 25-Hydroxy: 39.9 ng/mL (ref 30.0–100.0)

## 2021-06-25 LAB — HEMOGLOBIN A1C
Est. average glucose Bld gHb Est-mCnc: 140 mg/dL
Hgb A1c MFr Bld: 6.5 % — ABNORMAL HIGH (ref 4.8–5.6)

## 2021-06-25 LAB — T4, FREE: Free T4: 1.25 ng/dL (ref 0.82–1.77)

## 2021-06-25 LAB — T3: T3, Total: 101 ng/dL (ref 71–180)

## 2021-06-25 NOTE — Progress Notes (Signed)
Dear Dr. Brita Romp,   Thank you for referring KERENSA MACKLIN to our clinic. The following note includes my evaluation and treatment recommendations.  Chief Complaint:   [place] (MR# [id number]) is a 66 y.o. female who presents for evaluation and treatment of obesity and related comorbidities. Current BMI is Body mass index is 32.6 kg/m. Manami has been struggling with her weight for many years and has been unsuccessful in either losing weight, maintaining weight loss, or reaching her healthy weight goal.  Corinne is currently in the action stage of change and ready to dedicate time achieving and maintaining a healthier weight. Mirna is interested in becoming our patient and working on intensive lifestyle modifications including (but not limited to) diet and exercise for weight loss.  Referred by Dr. Brita Romp. Coffee with powdered sugar free creamer 1/3 cup + fruit cup (satisfied); 3 hours later- protein shake with almond milk + blueberries or banana (need something); Lunch- bowl of soup with crackers and string cheese or [employer] sandwich (feel satisfied); 3 hours later- orange or fruit or PB crackers + diet Sprite or unsweet tea; 5:30-6:30- meat (grilled chicken) or salmon + baked sweet potato and salad or steamed cabbage + 1 lb ground beef (3-4 meals).  Vala's habits were reviewed today and are as follows: her desired weight loss is 37 lbs, she has been heavy most of her life, she started gaining weight when her child was in middle school- 1990's, she has significant food cravings issues, she snacks frequently in the evenings, she is frequently drinking liquids with calories, and she struggles with emotional eating.  Depression Screen Karenann's Food and Mood (modified PHQ-9) score was 3.  Depression screen [facility] 2/9 06/24/2021  Decreased Interest 0  Down, Depressed, Hopeless 1  PHQ - 2 Score 1  Altered sleeping 0  Tired, decreased energy 1  Change in  appetite 0  Feeling bad or failure about yourself  1  Trouble concentrating 0  Moving slowly or fidgety/restless 0  Suicidal thoughts 0  PHQ-9 Score 3  Difficult doing work/chores Not difficult at all   Subjective:   1. Other fatigue Jaeci denies daytime somnolence and denies waking up still tired. Patient has a history of symptoms of NONE. Cyanne generally gets 8 hours of sleep per night, and states that she has generally restful sleep. Snoring is present. Apneic episodes are not present. Epworth Sleepiness Score is 1. EKG done 03/07/2021- low voltage with artifact, otherwise normal sinus rhythm.   2. SOBOE (shortness of breath on exertion) Hoyle Sauer notes increasing shortness of breath with exercising and seems to be worsening over time with weight gain. She notes getting out of breath sooner with activity than she used to. This has gotten worse recently. Syanna denies shortness of breath at rest or orthopnea.  3. Hyperglycemia Pt's last A1c was 6.3. She is not on meds.  4. Essential hypertension BP elevated today. Dx 10 years ago. Pt is on triamterene-HCTZ.  5. Other hyperlipidemia Kailany's last LDL was 119, HDL 71, and triglycerides 72. She is on simvastatin.  6. Vitamin D deficiency Pt's last Vit D level was 41.6 and she reports fatigue.  7. Anxiety Remiyah denies suicidal or homicidal ideations. Her husband died a year ago. She is on sertraline 50 mg.  Assessment/Plan:   1. Other fatigue Asheley does feel that her weight is causing her energy to be lower than it should be. Fatigue may be related to obesity, depression or many other  causes. Labs will be ordered, and in the meanwhile, Brea will focus on self care including making healthy food choices, increasing physical activity and focusing on stress reduction. Check labs today.  - T3 - T4, free - TSH  2. SOBOE (shortness of breath on exertion) Izola does feel that she gets out of breath more easily that she  used to when she exercises. Alida's shortness of breath appears to be obesity related and exercise induced. She has agreed to work on weight loss and gradually increase exercise to treat her exercise induced shortness of breath. Will continue to monitor closely. Check labs today.  - CBC with Differential/Platelet  3. Hyperglycemia Fasting labs will be obtained and results with be discussed with Hoyle Sauer in 2 weeks at her follow up visit. In the meanwhile Kessie was started on a lower simple carbohydrate diet and will work on weight loss efforts.  - Hemoglobin A1c - Insulin, random  4. Essential hypertension Aino is working on healthy weight loss and exercise to improve blood pressure control. We will watch for signs of hypotension as she continues her lifestyle modifications. Check labs today.  - Comprehensive metabolic panel  5. Other hyperlipidemia Cardiovascular risk and specific lipid/LDL goals reviewed.  We discussed several lifestyle modifications today and Harue will continue to work on diet, exercise and weight loss efforts. Orders and follow up as documented in patient record.   Counseling Intensive lifestyle modifications are the first line treatment for this issue. Dietary changes: Increase soluble fiber. Decrease simple carbohydrates. Exercise changes: Moderate to vigorous-intensity aerobic activity 150 minutes per week if tolerated. Lipid-lowering medications: see documented in medical record. Check labs today.  - Lipid Panel With LDL/HDL Ratio  6. Vitamin D deficiency Low Vitamin D level contributes to fatigue and are associated with obesity, breast, and colon cancer. She will follow-up for routine testing of Vitamin D, at least 2-3 times per year to avoid over-replacement. Check labs today.  - VITAMIN D 25 Hydroxy (Vit-D Deficiency, Fractures)  7. Anxiety Behavior modification techniques were discussed today to help Nikki deal with her anxiety.  Orders and  follow up as documented in patient record. F/u on symptoms at next appt.  8. Depression screening Willy had a negative depression screening. Depression is commonly associated with obesity and often results in emotional eating behaviors. We will monitor this closely and work on CBT to help improve the non-hunger eating patterns. Referral to Psychology may be required if no improvement is seen as she continues in our clinic.  9. Obesity with current BMI of 32.6 Saudia is currently in the action stage of change and her goal is to continue with weight loss efforts. I recommend Sharaine begin the structured treatment plan as follows:  She has agreed to the Category 3 Plan.  Exercise goals:  As is    Behavioral modification strategies: increasing lean protein intake, increasing vegetables, meal planning and cooking strategies, and planning for success.  She was informed of the importance of frequent follow-up visits to maximize her success with intensive lifestyle modifications for her multiple health conditions. She was informed we would discuss her lab results at her next visit unless there is a critical issue that needs to be addressed sooner. Rosaleen agreed to keep her next visit at the agreed upon time to discuss these results.  Objective:   Blood pressure (!) 151/79, pulse 85, temperature 97.9 F (36.6 C), height 5\' 6"  (1.676 m), weight 202 lb (91.6 kg), SpO2 98 %. Body mass index  is 32.6 kg/m.  EKG: Sinus rhythm, rate 80- Abnormal- low voltage in limb leads (done 03/07/2021).  Indirect Calorimeter completed today shows a VO2 of 306 and a REE of 2117.  Her calculated basal metabolic rate is [id number] thus her basal metabolic rate is worse than expected.  General: Cooperative, alert, well developed, in no acute distress. HEENT: Conjunctivae and lids unremarkable. Cardiovascular: Regular rhythm.  Lungs: Normal work of breathing. Neurologic: No focal deficits.   Lab Results  Component  Value Date   CREATININE 0.70 06/24/2021   BUN 14 06/24/2021   NA 134 06/24/2021   K 4.4 06/24/2021   CL 94 (L) 06/24/2021   CO2 25 06/24/2021   Lab Results  Component Value Date   ALT 22 06/24/2021   AST 26 06/24/2021   ALKPHOS 87 06/24/2021   BILITOT 0.6 06/24/2021   Lab Results  Component Value Date   HGBA1C 6.5 (H) 06/24/2021   HGBA1C 6.3 (H) 02/20/2021   HGBA1C 6.0 (H) 10/13/2019   HGBA1C 6.1 (H) 09/30/2018   HGBA1C 6.0 (H) 10/14/2017   Lab Results  Component Value Date   INSULIN 11.3 06/24/2021   Lab Results  Component Value Date   TSH 2.120 06/24/2021   Lab Results  Component Value Date   CHOL 200 (H) 06/24/2021   HDL 78 06/24/2021   LDLCALC 109 (H) 06/24/2021   TRIG 73 06/24/2021   CHOLHDL 2.9 02/20/2021   Lab Results  Component Value Date   WBC 4.9 06/24/2021   HGB 13.6 06/24/2021   HCT 41.5 06/24/2021   MCV 88 06/24/2021   PLT 373 06/24/2021   Attestation Statements:   Reviewed by clinician on day of visit: allergies, medications, problem list, medical history, surgical history, family history, social history, and previous encounter notes.  Coral Ceo, CMA, am acting as transcriptionist for Coralie Common, MD.   This is the patient's first visit at [facility]. The patient's NEW PATIENT PACKET was reviewed at length. Included in the packet: current and past health history, medications, allergies, ROS, gynecologic history (women only), surgical history, family history, social history, weight history, weight loss surgery history (for those that have had weight loss surgery), nutritional evaluation, mood and food questionnaire, PHQ9, Epworth questionnaire, sleep habits questionnaire, patient life and health improvement goals questionnaire. These will all be scanned into the patient's chart under media.   During the visit, I independently reviewed the patient's EKG, bioimpedance scale results, and indirect calorimeter results. I  used this information to tailor a meal plan for the patient that will help her to lose weight and will improve her obesity-related conditions going forward. I performed a medically necessary appropriate examination and/or evaluation. I discussed the assessment and treatment plan with the patient. The patient was provided an opportunity to ask questions and all were answered. The patient agreed with the plan and demonstrated an understanding of the instructions. Labs were ordered at this visit and will be reviewed at the next visit unless more critical results need to be addressed immediately. Clinical information was updated and documented in the EMR.   Time spent on visit including pre-visit chart review and post-visit care was 60 minutes.   A separate 15 minutes was spent on risk counseling (see above).  I have reviewed the above documentation for accuracy and completeness, and I agree with the above. - Coralie Common, MD

## 2021-07-08 ENCOUNTER — Ambulatory Visit (INDEPENDENT_AMBULATORY_CARE_PROVIDER_SITE_OTHER): Payer: Medicare Other | Admitting: Family Medicine

## 2021-07-08 ENCOUNTER — Encounter (INDEPENDENT_AMBULATORY_CARE_PROVIDER_SITE_OTHER): Payer: Self-pay | Admitting: Family Medicine

## 2021-07-08 ENCOUNTER — Other Ambulatory Visit: Payer: Self-pay

## 2021-07-08 VITALS — BP 129/71 | HR 109 | Temp 98.1°F | Ht 66.0 in | Wt 202.0 lb

## 2021-07-08 DIAGNOSIS — E1159 Type 2 diabetes mellitus with other circulatory complications: Secondary | ICD-10-CM | POA: Diagnosis not present

## 2021-07-08 DIAGNOSIS — E559 Vitamin D deficiency, unspecified: Secondary | ICD-10-CM

## 2021-07-08 DIAGNOSIS — E785 Hyperlipidemia, unspecified: Secondary | ICD-10-CM

## 2021-07-08 DIAGNOSIS — Z6832 Body mass index (BMI) 32.0-32.9, adult: Secondary | ICD-10-CM

## 2021-07-08 DIAGNOSIS — I152 Hypertension secondary to endocrine disorders: Secondary | ICD-10-CM | POA: Diagnosis not present

## 2021-07-08 DIAGNOSIS — E1169 Type 2 diabetes mellitus with other specified complication: Secondary | ICD-10-CM | POA: Diagnosis not present

## 2021-07-08 DIAGNOSIS — E1165 Type 2 diabetes mellitus with hyperglycemia: Secondary | ICD-10-CM | POA: Diagnosis not present

## 2021-07-08 DIAGNOSIS — E669 Obesity, unspecified: Secondary | ICD-10-CM

## 2021-07-08 MED ORDER — VITAMIN D3 125 MCG (5000 UT) PO CAPS
5000.0000 [IU] | ORAL_CAPSULE | Freq: Every day | ORAL | Status: AC
Start: 2021-07-08 — End: ?

## 2021-07-09 NOTE — Progress Notes (Signed)
Chief Complaint:   OBESITY Krista Cooper is here to discuss her progress with her obesity treatment plan along with follow-up of her obesity related diagnoses. Krista Cooper is on the Category 3 Plan and states she is following her eating plan approximately 95% of the time. Krista Cooper states she is going to the [facility] and walking 30-45 minutes 2-7 times per week.  Today's visit was #: 2 Starting weight: 202 lbs Starting date: 06/24/2021 Today's weight: 202 lbs Today's date: 07/08/2021 Total lbs lost to date: 0 Total lbs lost since last in-office visit: 0  Interim History: Krista Cooper waited a few days to start the meal plan, as she needed to eat down some of the food she had previously. She felt it was quite a bit of meat on a sandwich. She is doing 100 calorie packs from [employer] for snacks. Krista Cooper does not anticipate any obstacles. She is going on a cruise in August.  Subjective:   1. Hyperlipidemia associated with type 2 diabetes mellitus ([place]) Discussed labs with patient today. Krista Cooper has an LDL of 109, HDL 78, and triglycerides 73. She is on Zocor 20 mg.  The 10-year ASCVD risk score (Arnett DK, et al., 2019) is: 12.3%   Values used to calculate the score:     Age: 66 years     Sex: Female     Is Non-Hispanic African American: No     Diabetic: Yes     Tobacco smoker: No     Systolic Blood Pressure: [id number] mmHg     Is BP treated: Yes     HDL Cholesterol: 78 mg/dL     Total Cholesterol: 200 mg/dL  2. Vitamin D deficiency Discussed labs with patient today. Krista Cooper is on OTC Vit D 1K IU daily and reports fatigue.  3. Hypertension associated with diabetes ([place]) Discussed labs with patient today. BP well controlled today. Krista Cooper is on Maxzide-HCTZ.  4. Type 2 diabetes mellitus with hyperglycemia, without long-term current use of insulin ([place]) Discussed labs with patient today. New. Worsening. Krista Cooper's A1c is 6.5. She has been dx with pre-diabetes for at least 6 years. Krista Cooper would like to avoid  medication.  Assessment/Plan:   1. Hyperlipidemia associated with type 2 diabetes mellitus ([place]) Cardiovascular risk and specific lipid/LDL goals reviewed.  We discussed several lifestyle modifications today and Krista Cooper will continue to work on diet, exercise and weight loss efforts. Orders and follow up as documented in patient record. Continue Zocor 20 mg. Repeat labs in 3 months. We may need to increase statin intensity, pending repeat labs.  Counseling Intensive lifestyle modifications are the first line treatment for this issue. Dietary changes: Increase soluble fiber. Decrease simple carbohydrates. Exercise changes: Moderate to vigorous-intensity aerobic activity 150 minutes per week if tolerated. Lipid-lowering medications: see documented in medical record.  2. Vitamin D deficiency Low Vitamin D level contributes to fatigue and are associated with obesity, breast, and colon cancer. She agrees to increase to OTC Vitamin D 5,000 IU daily and will follow-up for routine testing of Vitamin D, at least 2-3 times per year to avoid over-replacement.  Increase to- Cholecalciferol (VITAMIN D3) 125 MCG (5000 UT) CAPS; Take 1 capsule (5,000 Units total) by mouth daily.  Dispense: 30 capsule  3. Hypertension associated with diabetes ([place]) Krista Cooper is working on healthy weight loss and exercise to improve blood pressure control. We will watch for signs of hypotension as she continues her lifestyle modifications. Continue current treatment plan and f/u on BP at next appt.  4.  Type 2 diabetes mellitus with hyperglycemia, without long-term current use of insulin (H[facility]) Good blood sugar control is important to decrease the likelihood of diabetic complications such as nephropathy, neuropathy, limb loss, blindness, coronary artery disease, and death. Intensive lifestyle modification including diet, exercise and weight loss are the first line of treatment for diabetes. We discussed the pathophysiology of  diabetes. Krista Cooper would like to repeat labs in 3 months.  5. Obesity with current BMI of 32.7 Krista Cooper is currently in the action stage of change. As such, her goal is to continue with weight loss efforts. She has agreed to the Category 3 Plan.   Exercise goals: All adults should avoid inactivity. Some physical activity is better than none, and adults who participate in any amount of physical activity gain some health benefits.  Behavioral modification strategies: increasing lean protein intake, meal planning and cooking strategies, keeping healthy foods in the home, and planning for success.  Krista Cooper has agreed to follow-up with our clinic in 2 weeks. She was informed of the importance of frequent follow-up visits to maximize her success with intensive lifestyle modifications for her multiple health conditions.   Objective:   Blood pressure 129/71, pulse (!) 109, temperature 98.1 F (36.7 C), height 5\' 6"  (1.676 m), weight 202 lb (91.6 kg), SpO2 99 %. Body mass index is 32.6 kg/m.  General: Cooperative, alert, well developed, in no acute distress. HEENT: Conjunctivae and lids unremarkable. Cardiovascular: Regular rhythm.  Lungs: Normal work of breathing. Neurologic: No focal deficits.   Lab Results  Component Value Date   CREATININE 0.70 06/24/2021   BUN 14 06/24/2021   NA 134 06/24/2021   K 4.4 06/24/2021   CL 94 (L) 06/24/2021   CO2 25 06/24/2021   Lab Results  Component Value Date   ALT 22 06/24/2021   AST 26 06/24/2021   ALKPHOS 87 06/24/2021   BILITOT 0.6 06/24/2021   Lab Results  Component Value Date   HGBA1C 6.5 (H) 06/24/2021   HGBA1C 6.3 (H) 02/20/2021   HGBA1C 6.0 (H) 10/13/2019   HGBA1C 6.1 (H) 09/30/2018   HGBA1C 6.0 (H) 10/14/2017   Lab Results  Component Value Date   INSULIN 11.3 06/24/2021   Lab Results  Component Value Date   TSH 2.120 06/24/2021   Lab Results  Component Value Date   CHOL 200 (H) 06/24/2021   HDL 78 06/24/2021   LDLCALC 109 (H)  06/24/2021   TRIG 73 06/24/2021   CHOLHDL 2.9 02/20/2021   Lab Results  Component Value Date   VD25OH 39.9 06/24/2021   VD25OH 41.6 02/20/2021   VD25OH 39.1 10/13/2019   Lab Results  Component Value Date   WBC 4.9 06/24/2021   HGB 13.6 06/24/2021   HCT 41.5 06/24/2021   MCV 88 06/24/2021   PLT 373 06/24/2021   No results found for: IRON, TIBC, FERRITIN  Obesity Behavioral Intervention:   Approximately 15 minutes were spent on the discussion below.  ASK: We discussed the diagnosis of obesity with Krista Cooper today and Krista Cooper agreed to give [place] permission to discuss obesity behavioral modification therapy today.  ASSESS: Honoria has the diagnosis of obesity and her BMI today is 32.7. Kathlee is in the action stage of change.   ADVISE: Alinta was educated on the multiple health risks of obesity as well as the benefit of weight loss to improve her health. She was advised of the need for long term treatment and the importance of lifestyle modifications to improve her current health and to  decrease her risk of future health problems.  AGREE: Multiple dietary modification options and treatment options were discussed and Devonda agreed to follow the recommendations documented in the above note.  ARRANGE: Erynne was educated on the importance of frequent visits to treat obesity as outlined per CMS and USPSTF guidelines and agreed to schedule her next follow up appointment today.  Attestation Statements:   Reviewed by clinician on day of visit: allergies, medications, problem list, medical history, surgical history, family history, social history, and previous encounter notes.  Coral Ceo, CMA, am acting as transcriptionist for Coralie Common, MD.   I have reviewed the above documentation for accuracy and completeness, and I agree with the above. - Coralie Common, MD

## 2021-07-22 ENCOUNTER — Ambulatory Visit (INDEPENDENT_AMBULATORY_CARE_PROVIDER_SITE_OTHER): Payer: Federal, State, Local not specified - PPO | Admitting: Family Medicine

## 2021-07-22 ENCOUNTER — Encounter (INDEPENDENT_AMBULATORY_CARE_PROVIDER_SITE_OTHER): Payer: Self-pay

## 2021-08-14 ENCOUNTER — Encounter (INDEPENDENT_AMBULATORY_CARE_PROVIDER_SITE_OTHER): Payer: Self-pay | Admitting: Family Medicine

## 2021-08-14 ENCOUNTER — Ambulatory Visit (INDEPENDENT_AMBULATORY_CARE_PROVIDER_SITE_OTHER): Payer: Medicare Other | Admitting: Family Medicine

## 2021-08-14 VITALS — BP 133/79 | HR 92 | Temp 97.9°F | Ht 66.0 in | Wt 196.0 lb

## 2021-08-14 DIAGNOSIS — E1165 Type 2 diabetes mellitus with hyperglycemia: Secondary | ICD-10-CM

## 2021-08-14 DIAGNOSIS — E669 Obesity, unspecified: Secondary | ICD-10-CM

## 2021-08-14 DIAGNOSIS — E559 Vitamin D deficiency, unspecified: Secondary | ICD-10-CM | POA: Diagnosis not present

## 2021-08-14 DIAGNOSIS — Z6831 Body mass index (BMI) 31.0-31.9, adult: Secondary | ICD-10-CM | POA: Diagnosis not present

## 2021-08-19 NOTE — Progress Notes (Signed)
? ? ? ?Chief Complaint:  ? ?OBESITY ?Orion is here to discuss her progress with her obesity treatment plan along with follow-up of her obesity related diagnoses. Icy is on the Category 3 Plan and states she is following her eating plan approximately 95% of the time. Anwita states she is walking for 30 minutes 7 times per week. ? ?Today's visit was #: 3 ?Starting weight: 202 lbs ?Starting date: 06/24/2021 ?Today's weight: 196 lbs ?Today's date: 08/14/2021 ?Total lbs lost to date: 6 ?Total lbs lost since last in-office visit: 6 ? ?Interim History: Betti voices the last few weeks she has been walking everyday and trying to follow the plan more consistently. She is able to get all food in on the Category 3 plan. She is making adjustments to increase protein content if she doesn't otherwise she really likes the meal plan.  ? ?Subjective:  ? ?1. Type 2 diabetes mellitus with hyperglycemia, without long-term current use of insulin (HCC) ?Johnie is not on medications. Last A1c was 6.5. She notes controlled sugar cravings.  ? ?2. Vitamin D deficiency ?Madrid is not on prescription Vitamin D, but she is on OTC Vit D. She notes fatigue.  ? ?Assessment/Plan:  ? ?1. Type 2 diabetes mellitus with hyperglycemia, without long-term current use of insulin (HCC) ?We will follow up on Katiria's labs in 2 months.  ? ?2. Vitamin D deficiency ?Omeka will continue OTC Vitamin D 5,000 IU daily.  ? ?3. Obesity with current BMI of 31.7 ?Briarrose is currently in the action stage of change. As such, her goal is to continue with weight loss efforts. She has agreed to the Category 3 Plan.  ? ?Exercise goals: As is. ? ?Behavioral modification strategies: increasing lean protein intake, meal planning and cooking strategies, keeping healthy foods in the home, and planning for success. ? ?Azara has agreed to follow-up with our clinic in 2 to 3 weeks. She was informed of the importance of frequent follow-up visits to maximize her success  with intensive lifestyle modifications for her multiple health conditions.  ? ?Objective:  ? ?Blood pressure 133/79, pulse 92, temperature 97.9 ?F (36.6 ?C), height 5\' 6"  (1.676 m), weight 196 lb (88.9 kg), SpO2 98 %. ?Body mass index is 31.64 kg/m?. ? ?General: Cooperative, alert, well developed, in no acute distress. ?HEENT: Conjunctivae and lids unremarkable. ?Cardiovascular: Regular rhythm.  ?Lungs: Normal work of breathing. ?Neurologic: No focal deficits.  ? ?Lab Results  ?Component Value Date  ? CREATININE 0.70 06/24/2021  ? BUN 14 06/24/2021  ? NA 134 06/24/2021  ? K 4.4 06/24/2021  ? CL 94 (L) 06/24/2021  ? CO2 25 06/24/2021  ? ?Lab Results  ?Component Value Date  ? ALT 22 06/24/2021  ? AST 26 06/24/2021  ? ALKPHOS 87 06/24/2021  ? BILITOT 0.6 06/24/2021  ? ?Lab Results  ?Component Value Date  ? HGBA1C 6.5 (H) 06/24/2021  ? HGBA1C 6.3 (H) 02/20/2021  ? HGBA1C 6.0 (H) 10/13/2019  ? HGBA1C 6.1 (H) 09/30/2018  ? HGBA1C 6.0 (H) 10/14/2017  ? ?Lab Results  ?Component Value Date  ? INSULIN 11.3 06/24/2021  ? ?Lab Results  ?Component Value Date  ? TSH 2.120 06/24/2021  ? ?Lab Results  ?Component Value Date  ? CHOL 200 (H) 06/24/2021  ? HDL 78 06/24/2021  ? LDLCALC 109 (H) 06/24/2021  ? TRIG 73 06/24/2021  ? CHOLHDL 2.9 02/20/2021  ? ?Lab Results  ?Component Value Date  ? VD25OH 39.9 06/24/2021  ? VD25OH 41.6 02/20/2021  ? VD25OH  39.1 10/13/2019  ? ?Lab Results  ?Component Value Date  ? WBC 4.9 06/24/2021  ? HGB 13.6 06/24/2021  ? HCT 41.5 06/24/2021  ? MCV 88 06/24/2021  ? PLT 373 06/24/2021  ? ?No results found for: IRON, TIBC, FERRITIN ? ?Attestation Statements:  ? ?Reviewed by clinician on day of visit: allergies, medications, problem list, medical history, surgical history, family history, social history, and previous encounter notes. ? ? ?I, Burt Knack, am acting as transcriptionist for Reuben Likes, MD. ? ?I have reviewed the above documentation for accuracy and completeness, and I agree with the  above. Reuben Likes, MD ? ? ?

## 2021-08-26 ENCOUNTER — Ambulatory Visit (INDEPENDENT_AMBULATORY_CARE_PROVIDER_SITE_OTHER): Payer: Medicare Other | Admitting: Family Medicine

## 2021-08-26 ENCOUNTER — Encounter: Payer: Self-pay | Admitting: Family Medicine

## 2021-08-26 VITALS — BP 129/71 | HR 78 | Temp 98.0°F | Wt 201.0 lb

## 2021-08-26 DIAGNOSIS — I152 Hypertension secondary to endocrine disorders: Secondary | ICD-10-CM

## 2021-08-26 DIAGNOSIS — E1159 Type 2 diabetes mellitus with other circulatory complications: Secondary | ICD-10-CM | POA: Diagnosis not present

## 2021-08-26 DIAGNOSIS — F419 Anxiety disorder, unspecified: Secondary | ICD-10-CM | POA: Diagnosis not present

## 2021-08-26 DIAGNOSIS — E1169 Type 2 diabetes mellitus with other specified complication: Secondary | ICD-10-CM

## 2021-08-26 DIAGNOSIS — E785 Hyperlipidemia, unspecified: Secondary | ICD-10-CM

## 2021-08-26 MED ORDER — FISH OIL 1200 MG PO CAPS: 2.0000 | ORAL_CAPSULE | Freq: Every day | ORAL | 3 refills | Status: AC

## 2021-08-26 MED ORDER — TRIAMTERENE-HCTZ 37.5-25 MG PO TABS: 1.0000 | ORAL_TABLET | Freq: Every day | ORAL | 3 refills | Status: DC

## 2021-08-26 MED ORDER — SERTRALINE HCL 50 MG PO TABS: 50.0000 mg | ORAL_TABLET | Freq: Every day | ORAL | 3 refills | Status: DC

## 2021-08-26 MED ORDER — ALPRAZOLAM 0.25 MG PO TABS: 0.2500 mg | ORAL_TABLET | Freq: Two times a day (BID) | ORAL | 0 refills | Status: DC | PRN

## 2021-08-26 MED ORDER — SIMVASTATIN 20 MG PO TABS: 20.0000 mg | ORAL_TABLET | Freq: Every day | ORAL | 3 refills | Status: DC

## 2021-08-26 MED ORDER — SCOPOLAMINE 1 MG/3DAYS TD PT72: 1.0000 | MEDICATED_PATCH | TRANSDERMAL | 0 refills | Status: DC

## 2021-08-26 MED ORDER — LEVOCETIRIZINE DIHYDROCHLORIDE 5 MG PO TABS: 5.0000 mg | ORAL_TABLET | Freq: Every evening | ORAL | 3 refills | Status: DC

## 2021-08-26 MED ORDER — COQ10 200 MG PO CAPS: 200.0000 mg | ORAL_CAPSULE | Freq: Every day | ORAL | 3 refills | Status: AC

## 2021-08-26 NOTE — Assessment & Plan Note (Signed)
Chronic and well-controlled ?Continue Zoloft 50 mg daily ?Continue to use Xanax very sparingly for situational anxiety, including public speaking ?

## 2021-08-26 NOTE — Assessment & Plan Note (Signed)
Chronic and stable on last lipid panel, which was reviewed ?Continue co-Q10 and statin ?

## 2021-08-26 NOTE — Assessment & Plan Note (Addendum)
New diagnosis since last visit with last A1c 6.5 ?Associated with/complicated by HTN and HLD ?She is also following with [facility] ?Not currently on any medications ?Not due for recheck A1c yet ?ROI sent for last eye exam ?Foot exam today ?Up-to-date on vaccinations ?U ACR today ?

## 2021-08-26 NOTE — Assessment & Plan Note (Signed)
Well controlled on home readings Continue current medications Reviewed recent metabolic panel F/u in 6 months  

## 2021-08-26 NOTE — Progress Notes (Signed)
? ?I,Elena D DeSanto,acting as a scribe for Lavon Paganini, MD.,have documented all relevant documentation on the behalf of Lavon Paganini, MD,as directed by  Lavon Paganini, MD while in the presence of Lavon Paganini, MD. ?  ? ? ?Established patient visit ? ? ?Patient: Krista Cooper   DOB: 20-Dec-1955   66 y.o. Female  MRN: [id number] ?Visit Date: 08/26/2021 ? ?Today's healthcare provider: Lavon Paganini, MD  ? ?Chief Complaint  ?Patient presents with  ? Follow-up  ? ?Subjective  ?  ?HPI  ?Hypertension, follow-up ? ?BP Readings from Last 3 Encounters:  ?08/26/21 129/71  ?08/14/21 133/79  ?07/08/21 129/71  ? Wt Readings from Last 3 Encounters:  ?08/26/21 201 lb (91.2 kg)  ?08/14/21 196 lb (88.9 kg)  ?07/08/21 202 lb (91.6 kg)  ?  ? ?She was last seen for hypertension 6 months ago.  ?Management since that visit includes no changes. ? ?She reports good compliance with treatment. ?She is not having side effects.  ? ?Outside blood pressures are ranging in the 120's-130's over 70's and 80's. ?Symptoms: ?No chest pain No chest pressure  ?No palpitations No syncope  ?No dyspnea No orthopnea  ?No paroxysmal nocturnal dyspnea No lower extremity edema  ? ?Pertinent labs ?Lab Results  ?Component Value Date  ? CHOL 200 (H) 06/24/2021  ? HDL 78 06/24/2021  ? LDLCALC 109 (H) 06/24/2021  ? TRIG 73 06/24/2021  ? CHOLHDL 2.9 02/20/2021  ? Lab Results  ?Component Value Date  ? NA 134 06/24/2021  ? K 4.4 06/24/2021  ? CREATININE 0.70 06/24/2021  ? EGFR 96 06/24/2021  ? GLUCOSE 108 (H) 06/24/2021  ? TSH 2.120 06/24/2021  ?  ? ?The 10-year ASCVD risk score (Arnett DK, et al., 2019) is: 12.3% ? ?--------------------------------------------------------------------------------------------------- ? ? ?Medications: ?Outpatient Medications Prior to Visit  ?Medication Sig  ? B Complex Vitamins (VITAMIN B COMPLEX PO) Take 1 tablet by mouth daily.  ? Cholecalciferol (VITAMIN D3) 125 MCG (5000 UT) CAPS Take 1 capsule (5,000 Units  total) by mouth daily.  ? MULTIPLE VITAMIN PO Take 1 tablet by mouth daily.  ? [DISCONTINUED] ALPRAZolam (XANAX) 0.25 MG tablet Take 1 tablet (0.25 mg total) by mouth 2 (two) times daily as needed for anxiety.  ? [DISCONTINUED] Coenzyme Q10 (COQ10) 200 MG CAPS Take 200 mg by mouth daily with breakfast.  ? [DISCONTINUED] levocetirizine (XYZAL) 5 MG tablet TAKE 1 TABLET BY MOUTH ONCE DAILY IN THE EVENING  ? [DISCONTINUED] Omega-3 Fatty Acids (FISH OIL) 1200 MG CAPS Take 2 capsules by mouth daily.  ? [DISCONTINUED] sertraline (ZOLOFT) 50 MG tablet Take 1 tablet by mouth once daily  ? [DISCONTINUED] simvastatin (ZOCOR) 20 MG tablet TAKE 1 TABLET BY MOUTH AT BEDTIME  ? [DISCONTINUED] triamterene-hydrochlorothiazide (MAXZIDE-25) 37.5-25 MG tablet Take 1 tablet by mouth once daily  ? ?No facility-administered medications prior to visit.  ? ? ?Review of Systems per HPI ? ?  ?  Objective  ?  ?BP 129/71   Pulse 78   Temp 98 ?F (36.7 ?C) (Oral)   Wt 201 lb (91.2 kg)   SpO2 100%   BMI 32.44 kg/m?  ?  ?Vitals:  ? 08/26/21 1104 08/26/21 1108  ?BP: (!) 147/87 129/71  ?Pulse: 78   ?Temp: 98 ?F (36.7 ?C)   ?TempSrc: Oral   ?SpO2: 100%   ?Weight: 201 lb (91.2 kg)   ? ? ? ?Physical Exam ?Vitals reviewed.  ?Constitutional:   ?   General: She is not in acute distress. ?  Appearance: Normal appearance. She is well-developed. She is not diaphoretic.  ?HENT:  ?   Head: Normocephalic and atraumatic.  ?Eyes:  ?   General: No scleral icterus. ?   Conjunctiva/sclera: Conjunctivae normal.  ?Neck:  ?   Thyroid: No thyromegaly.  ?Cardiovascular:  ?   Rate and Rhythm: Normal rate and regular rhythm.  ?   Pulses: Normal pulses.  ?   Heart sounds: Normal heart sounds. No murmur heard. ?Pulmonary:  ?   Effort: Pulmonary effort is normal. No respiratory distress.  ?   Breath sounds: Normal breath sounds. No wheezing, rhonchi or rales.  ?Musculoskeletal:  ?   Cervical back: Neck supple.  ?   Right lower leg: No edema.  ?   Left lower leg: No  edema.  ?Lymphadenopathy:  ?   Cervical: No cervical adenopathy.  ?Skin: ?   General: Skin is warm and dry.  ?   Findings: No rash.  ?Neurological:  ?   Mental Status: She is alert and oriented to person, place, and time. Mental status is at baseline.  ?Psychiatric:     ?   Mood and Affect: Mood normal.     ?   Behavior: Behavior normal.  ?  ?Diabetic Foot Exam - Simple   ?Simple Foot Form ?Diabetic Foot exam was performed with the following findings: Yes 08/26/2021 11:16 AM  ?Visual Inspection ?No deformities, no ulcerations, no other skin breakdown bilaterally: Yes ?Sensation Testing ?Intact to touch and monofilament testing bilaterally: Yes ?Pulse Check ?Posterior Tibialis and Dorsalis pulse intact bilaterally: Yes ?Comments ?Bunion of L foot ?  ?  ? ?No results found for any visits on 08/26/21. ? Assessment & Plan  ?  ? ? ?Problem List Items Addressed This Visit   ? ?  ? Cardiovascular and Mediastinum  ? Hypertension associated with diabetes ([place]) - Primary  ?  Well controlled on home readings ?Continue current medications ?Reviewed recent metabolic panel ?F/u in 6 months  ?  ?  ? Relevant Medications  ? simvastatin (ZOCOR) 20 MG tablet  ? triamterene-hydrochlorothiazide (MAXZIDE-25) 37.5-25 MG tablet  ?  ? Endocrine  ? Hyperlipidemia associated with type 2 diabetes mellitus ([place])  ?  Chronic and stable on last lipid panel, which was reviewed ?Continue co-Q10 and statin ?  ?  ? Relevant Medications  ? simvastatin (ZOCOR) 20 MG tablet  ? triamterene-hydrochlorothiazide (MAXZIDE-25) 37.5-25 MG tablet  ? T2DM (type 2 diabetes mellitus) ([place])  ?  New diagnosis since last visit with last A1c 6.5 ?Associated with/complicated by HTN and HLD ?She is also following with healthy weight and wellness ?Not currently on any medications ?Not due for recheck A1c yet ?ROI sent for last eye exam ?Foot exam today ?Up-to-date on vaccinations ?U ACR today ?  ?  ? Relevant Medications  ? simvastatin (ZOCOR) 20 MG tablet  ? Other  Relevant Orders  ? Urine Microalbumin w/creat. ratio  ?  ? Other  ? Anxiety  ?  Chronic and well-controlled ?Continue Zoloft 50 mg daily ?Continue to use Xanax very sparingly for situational anxiety, including public speaking ?  ?  ? Relevant Medications  ? ALPRAZolam (XANAX) 0.25 MG tablet  ? sertraline (ZOLOFT) 50 MG tablet  ?  ? ?Return in about 6 months (around 02/25/2022) for AWV, CPE.  ? ?I, Lavon Paganini, MD, have reviewed all documentation for this visit. The documentation on 08/26/21 for the exam, diagnosis, procedures, and orders are all accurate and complete. ? ? ?Virginia Crews,  MD, MPH ?[place] ?[facility]   ?

## 2021-08-30 LAB — MICROALBUMIN / CREATININE URINE RATIO
Creatinine, Urine: 21 mg/dL
Microalb/Creat Ratio: <14 mg/g creat (ref 0–29)
Microalbumin, Urine: <3 ug/mL

## 2021-09-02 ENCOUNTER — Ambulatory Visit (INDEPENDENT_AMBULATORY_CARE_PROVIDER_SITE_OTHER): Payer: Medicare Other | Admitting: Family Medicine

## 2021-09-02 ENCOUNTER — Encounter (INDEPENDENT_AMBULATORY_CARE_PROVIDER_SITE_OTHER): Payer: Self-pay | Admitting: Family Medicine

## 2021-09-02 VITALS — BP 142/78 | HR 98 | Temp 98.6°F | Ht 66.0 in | Wt 196.0 lb

## 2021-09-02 DIAGNOSIS — E1159 Type 2 diabetes mellitus with other circulatory complications: Secondary | ICD-10-CM

## 2021-09-02 DIAGNOSIS — I152 Hypertension secondary to endocrine disorders: Secondary | ICD-10-CM

## 2021-09-02 DIAGNOSIS — E1069 Type 1 diabetes mellitus with other specified complication: Secondary | ICD-10-CM

## 2021-09-02 DIAGNOSIS — Z6831 Body mass index (BMI) 31.0-31.9, adult: Secondary | ICD-10-CM

## 2021-09-02 DIAGNOSIS — E1169 Type 2 diabetes mellitus with other specified complication: Secondary | ICD-10-CM

## 2021-09-02 DIAGNOSIS — E559 Vitamin D deficiency, unspecified: Secondary | ICD-10-CM

## 2021-09-02 DIAGNOSIS — Z9189 Other specified personal risk factors, not elsewhere classified: Secondary | ICD-10-CM

## 2021-09-02 DIAGNOSIS — E669 Obesity, unspecified: Secondary | ICD-10-CM

## 2021-09-05 ENCOUNTER — Ambulatory Visit: Payer: Medicare Other | Admitting: Family Medicine

## 2021-09-16 NOTE — Progress Notes (Signed)
? ? ? ?Chief Complaint:  ? ?OBESITY ?Krista Cooper is here to discuss her progress with her obesity treatment plan along with follow-up of her obesity related diagnoses. Krista Cooper is on the Category 3 Plan and states she is following her eating plan approximately 95% of the time. Krista Cooper states she is walking for 30 minutes 7 times per week. ? ?Today's visit was #: 4 ?Starting weight: 202 lbs ?Starting date: 06/24/2021 ?Today's weight: 196 lbs ?Today's date: 09/02/2021 ?Total lbs lost to date: 6 ?Total lbs lost since last in-office visit: 0 ? ?Interim History: Krista Cooper is here for a follow up office visit.  We reviewed her meal plan and all questions were answered.  Patient's food recall appears to be accurate and consistent with what is on plan when she is following it. When  eating on plan, her hunger and cravings are well controlled. She is going on a cruise for 3 weeks, and she is leaving this Sunday.  ? ?Subjective:  ? ?1. Type 2 diabetes mellitus with other specified complication, without long-term current use of insulin (HCC) ?[name] denies hunger or cravings. She is not on medications and she prefers not being on them. Her last A1c was 6.5 approximately 2 months ago.  ? ?2. Vitamin D deficiency ?Krista Cooper was on 2,000 IU daily and after labs with [place] and level at 39; we increased to 5,000 IU daily. She is tolerating it well with no side effects.  ? ?3. Hypertension associated with type 2 diabetes mellitus (HCC) ?Krista Cooper's home blood pressure ranges at 120's/70's and 130's/80's, but not over that. Here is up and not at goal but she is with a history of white coat with a diagnosis of hypertension. She is on Maxzide.  ? ?4. At risk for hyperglycemia ?Krista Cooper has a history of some elevated blood glucose readings with going on vacation for 3 weeks, and history of diabetes mellitus. ? ?Assessment/Plan:  ?No orders of the defined types were placed in this encounter. ? ? ?There are no discontinued medications.  ? ?No  orders of the defined types were placed in this encounter. ?  ? ?1. Type 2 diabetes mellitus with other specified complication, without long-term current use of insulin (HCC) ?Good blood sugar control is important to decrease the likelihood of diabetic complications such as nephropathy, neuropathy, limb loss, blindness, coronary artery disease, and death. Intensive lifestyle modification including diet, exercise and weight loss are the first line of treatment for diabetes.  ? ?2. Vitamin D deficiency ?Approximately the 3rd week in May, we will recheck lab levels along with other labs. She will continue her supplement. Counseling was done on Vitamin D deficiency.  ? ?3. Hypertension associated with type 2 diabetes mellitus (HCC) ?Krista Cooper will continue her home blood pressure monitoring, and will continue her medications per her PCP. She will continue working on healthy weight loss and exercise to improve blood pressure control. We will watch for signs of hypotension as she continues her lifestyle modifications. ? ?4. At risk for hyperglycemia ?Krista Cooper was given approximately 9 minutes of counseling today regarding prevention of hyperglycemia. She was advised of hyperglycemia causes and the fact hyperglycemia is often asymptomatic. [name] was instructed to avoid skipping meals, eat regular protein rich meals and schedule low calorie but protein rich snacks as needed.  ? ?Repetitive spaced learning was employed today to elicit superior memory formation and behavioral change ? ?5. Obesity, current BMI 31.7 ?Krista Cooper is currently in the action stage of change. As such, her goal  is to continue with weight loss efforts. She has agreed to the Category 3 Plan.  ? ?Discussed travel eating strategies for her cruise. ? ?Exercise goals: As is. ? ?Behavioral modification strategies: increasing lean protein intake, celebration eating strategies, and avoiding temptations. ? ?Krista Cooper has agreed to follow-up with our clinic in 4  weeks. She was informed of the importance of frequent follow-up visits to maximize her success with intensive lifestyle modifications for her multiple health conditions.  ? ?Objective:  ? ?Blood pressure (!) 142/78, pulse 98, temperature 98.6 ?F (37 ?C), height 5\' 6"  (1.676 m), weight 196 lb (88.9 kg), SpO2 98 %. ?Body mass index is 31.64 kg/m?. ? ?General: Cooperative, alert, well developed, in no acute distress. ?HEENT: Conjunctivae and lids unremarkable. ?Cardiovascular: Regular rhythm.  ?Lungs: Normal work of breathing. ?Neurologic: No focal deficits.  ? ?Lab Results  ?Component Value Date  ? CREATININE 0.70 06/24/2021  ? BUN 14 06/24/2021  ? NA 134 06/24/2021  ? K 4.4 06/24/2021  ? CL 94 (L) 06/24/2021  ? CO2 25 06/24/2021  ? ?Lab Results  ?Component Value Date  ? ALT 22 06/24/2021  ? AST 26 06/24/2021  ? ALKPHOS 87 06/24/2021  ? BILITOT 0.6 06/24/2021  ? ?Lab Results  ?Component Value Date  ? HGBA1C 6.5 (H) 06/24/2021  ? HGBA1C 6.3 (H) 02/20/2021  ? HGBA1C 6.0 (H) 10/13/2019  ? HGBA1C 6.1 (H) 09/30/2018  ? HGBA1C 6.0 (H) 10/14/2017  ? ?Lab Results  ?Component Value Date  ? INSULIN 11.3 06/24/2021  ? ?Lab Results  ?Component Value Date  ? TSH 2.120 06/24/2021  ? ?Lab Results  ?Component Value Date  ? CHOL 200 (H) 06/24/2021  ? HDL 78 06/24/2021  ? LDLCALC 109 (H) 06/24/2021  ? TRIG 73 06/24/2021  ? CHOLHDL 2.9 02/20/2021  ? ?Lab Results  ?Component Value Date  ? VD25OH 39.9 06/24/2021  ? VD25OH 41.6 02/20/2021  ? VD25OH 39.1 10/13/2019  ? ?Lab Results  ?Component Value Date  ? WBC 4.9 06/24/2021  ? HGB 13.6 06/24/2021  ? HCT 41.5 06/24/2021  ? MCV 88 06/24/2021  ? PLT 373 06/24/2021  ? ?No results found for: IRON, TIBC, FERRITIN ? ?Attestation Statements:  ? ?Reviewed by clinician on day of visit: allergies, medications, problem list, medical history, surgical history, family history, social history, and previous encounter notes. ? ? ?I, 08/22/2021, am acting as transcriptionist for Burt Knack, DO. ? ?I  have reviewed the above documentation for accuracy and completeness, and I agree with the above. [employer], D.O. ? ?The 21st Century Cures Act was signed into law in 2016 which includes the topic of electronic health records.  This provides immediate access to information in MyChart.  This includes consultation notes, operative notes, office notes, lab results and pathology reports.  If you have any questions about what you read please let 2017 know at your next visit so we can discuss your concerns and take corrective action if need be.  We are right here with you. ? ? ?

## 2021-09-30 ENCOUNTER — Ambulatory Visit (INDEPENDENT_AMBULATORY_CARE_PROVIDER_SITE_OTHER): Payer: Medicare Other | Admitting: Family Medicine

## 2021-09-30 ENCOUNTER — Encounter (INDEPENDENT_AMBULATORY_CARE_PROVIDER_SITE_OTHER): Payer: Self-pay | Admitting: Family Medicine

## 2021-09-30 VITALS — BP 116/72 | HR 109 | Temp 98.3°F | Ht 66.0 in | Wt 196.0 lb

## 2021-09-30 DIAGNOSIS — E1165 Type 2 diabetes mellitus with hyperglycemia: Secondary | ICD-10-CM

## 2021-09-30 DIAGNOSIS — I10 Essential (primary) hypertension: Secondary | ICD-10-CM

## 2021-09-30 DIAGNOSIS — Z6831 Body mass index (BMI) 31.0-31.9, adult: Secondary | ICD-10-CM

## 2021-09-30 DIAGNOSIS — E669 Obesity, unspecified: Secondary | ICD-10-CM

## 2021-10-06 NOTE — Progress Notes (Signed)
Chief Complaint:   OBESITY Krista Cooper is here to discuss her progress with her obesity treatment plan along with follow-up of her obesity related diagnoses. Krista Cooper is on the Category 3 Plan and states she is following her eating plan approximately 50-60% of the time. Krista Cooper states she is walking 30 minutes 7 times per week.  Today's visit was #: 5 Starting weight: 202 lbs Starting date: 06/24/2021 Today's weight: 196 lbs Today's date: 09/30/2021 Total lbs lost to date: 6 lbs Total lbs lost since last in-office visit: 0  Interim History: Krista Cooper went on a 3 week long cruise and really enjoyed herself. She got 2 meals/per day while away. The next few weeks she has no plans for events or trips. She is going to  the beach at the end of August/early September. She will add extra meat to [employer] if she eats that for supper.   Subjective:   1. Essential hypertension Krista Cooper's blood pressure is well controlled today 116/72. She is currently on Maxzide-25.   2. Type 2 diabetes mellitus with hyperglycemia, without long-term current use of insulin (HCC) Krista Cooper is not on medications. Her last A1C was 6.5. She notes occasional increase in drive for carbohydrates.   Assessment/Plan:   1. Essential hypertension Krista Cooper will continue above medication. She will have no change in dose. She is working on healthy weight loss and exercise to improve blood pressure control. We will watch for signs of hypotension as she continues her lifestyle modifications.  2. Type 2 diabetes mellitus with hyperglycemia, without long-term current use of insulin (HCC) We will repeat labs in July (early). Good blood sugar control is important to decrease the likelihood of diabetic complications such as nephropathy, neuropathy, limb loss, blindness, coronary artery disease, and death. Intensive lifestyle modification including diet, exercise and weight loss are the first line of treatment for diabetes.   3. Obesity,  current BMI 31.7 Krista Cooper is currently in the action stage of change. As such, her goal is to continue with weight loss efforts. She has agreed to the Category 3 Plan.   Exercise goals:  As is.   Behavioral modification strategies: increasing lean protein intake, meal planning and cooking strategies, keeping healthy foods in the home, and planning for success.  Krista Cooper has agreed to follow-up with our clinic in 2-3 weeks. She was informed of the importance of frequent follow-up visits to maximize her success with intensive lifestyle modifications for her multiple health conditions.   Objective:   Blood pressure 116/72, pulse (!) 109, temperature 98.3 F (36.8 C), height 5\' 6"  (1.676 m), weight 196 lb (88.9 kg), SpO2 98 %. Body mass index is 31.64 kg/m.  General: Cooperative, alert, well developed, in no acute distress. HEENT: Conjunctivae and lids unremarkable. Cardiovascular: Regular rhythm.  Lungs: Normal work of breathing. Neurologic: No focal deficits.   Lab Results  Component Value Date   CREATININE 0.70 06/24/2021   BUN 14 06/24/2021   NA 134 06/24/2021   K 4.4 06/24/2021   CL 94 (L) 06/24/2021   CO2 25 06/24/2021   Lab Results  Component Value Date   ALT 22 06/24/2021   AST 26 06/24/2021   ALKPHOS 87 06/24/2021   BILITOT 0.6 06/24/2021   Lab Results  Component Value Date   HGBA1C 6.5 (H) 06/24/2021   HGBA1C 6.3 (H) 02/20/2021   HGBA1C 6.0 (H) 10/13/2019   HGBA1C 6.1 (H) 09/30/2018   HGBA1C 6.0 (H) 10/14/2017   Lab Results  Component Value Date  INSULIN 11.3 06/24/2021   Lab Results  Component Value Date   TSH 2.120 06/24/2021   Lab Results  Component Value Date   CHOL 200 (H) 06/24/2021   HDL 78 06/24/2021   LDLCALC 109 (H) 06/24/2021   TRIG 73 06/24/2021   CHOLHDL 2.9 02/20/2021   Lab Results  Component Value Date   VD25OH 39.9 06/24/2021   VD25OH 41.6 02/20/2021   VD25OH 39.1 10/13/2019   Lab Results  Component Value Date   WBC 4.9  06/24/2021   HGB 13.6 06/24/2021   HCT 41.5 06/24/2021   MCV 88 06/24/2021   PLT 373 06/24/2021   No results found for: IRON, TIBC, FERRITIN  Attestation Statements:   Reviewed by clinician on day of visit: allergies, medications, problem list, medical history, surgical history, family history, social history, and previous encounter notes.  I, Jackson Latino, RMA, am acting as transcriptionist for Reuben Likes, MD.  I have reviewed the above documentation for accuracy and completeness, and I agree with the above. - Reuben Likes, MD

## 2021-10-14 ENCOUNTER — Ambulatory Visit (INDEPENDENT_AMBULATORY_CARE_PROVIDER_SITE_OTHER): Payer: Medicare Other | Admitting: Family Medicine

## 2021-10-14 ENCOUNTER — Encounter (INDEPENDENT_AMBULATORY_CARE_PROVIDER_SITE_OTHER): Payer: Self-pay | Admitting: Family Medicine

## 2021-10-14 VITALS — BP 138/83 | HR 92 | Temp 98.4°F | Ht 66.0 in | Wt 194.0 lb

## 2021-10-14 DIAGNOSIS — E1169 Type 2 diabetes mellitus with other specified complication: Secondary | ICD-10-CM | POA: Diagnosis not present

## 2021-10-14 DIAGNOSIS — I152 Hypertension secondary to endocrine disorders: Secondary | ICD-10-CM | POA: Diagnosis not present

## 2021-10-14 DIAGNOSIS — E1159 Type 2 diabetes mellitus with other circulatory complications: Secondary | ICD-10-CM | POA: Diagnosis not present

## 2021-10-14 DIAGNOSIS — E1165 Type 2 diabetes mellitus with hyperglycemia: Secondary | ICD-10-CM

## 2021-10-14 DIAGNOSIS — E669 Obesity, unspecified: Secondary | ICD-10-CM

## 2021-10-14 DIAGNOSIS — E559 Vitamin D deficiency, unspecified: Secondary | ICD-10-CM

## 2021-10-14 DIAGNOSIS — Z6831 Body mass index (BMI) 31.0-31.9, adult: Secondary | ICD-10-CM

## 2021-10-14 DIAGNOSIS — E785 Hyperlipidemia, unspecified: Secondary | ICD-10-CM

## 2021-10-15 LAB — COMPREHENSIVE METABOLIC PANEL
ALT: 29 IU/L (ref 0–32)
AST: 30 IU/L (ref 0–40)
Albumin/Globulin Ratio: 2.1 (ref 1.2–2.2)
Albumin: 4.7 g/dL (ref 3.8–4.8)
Alkaline Phosphatase: 82 IU/L (ref 44–121)
BUN/Creatinine Ratio: 35 — ABNORMAL HIGH (ref 12–28)
BUN: 23 mg/dL (ref 8–27)
Bilirubin Total: 0.5 mg/dL (ref 0.0–1.2)
CO2: 24 mmol/L (ref 20–29)
Calcium: 9.9 mg/dL (ref 8.7–10.3)
Chloride: 96 mmol/L (ref 96–106)
Creatinine, Ser: 0.65 mg/dL (ref 0.57–1.00)
Globulin, Total: 2.2 g/dL (ref 1.5–4.5)
Glucose: 111 mg/dL — ABNORMAL HIGH (ref 70–99)
Potassium: 4.4 mmol/L (ref 3.5–5.2)
Sodium: 135 mmol/L (ref 134–144)
Total Protein: 6.9 g/dL (ref 6.0–8.5)
eGFR: 98 mL/min/{1.73_m2} (ref >59)

## 2021-10-15 LAB — LIPID PANEL WITH LDL/HDL RATIO
Cholesterol, Total: 230 mg/dL — ABNORMAL HIGH (ref 100–199)
HDL: 68 mg/dL (ref >39)
LDL Chol Calc (NIH): 147 mg/dL — ABNORMAL HIGH (ref 0–99)
LDL/HDL Ratio: 2.2 ratio (ref 0.0–3.2)
Triglycerides: 89 mg/dL (ref 0–149)
VLDL Cholesterol Cal: 15 mg/dL (ref 5–40)

## 2021-10-15 LAB — HEMOGLOBIN A1C
Est. average glucose Bld gHb Est-mCnc: 137 mg/dL
Hgb A1c MFr Bld: 6.4 % — ABNORMAL HIGH (ref 4.8–5.6)

## 2021-10-15 LAB — INSULIN, RANDOM: INSULIN: 9 u[IU]/mL (ref 2.6–24.9)

## 2021-10-15 LAB — VITAMIN D 25 HYDROXY (VIT D DEFICIENCY, FRACTURES): Vit D, 25-Hydroxy: 58.7 ng/mL (ref 30.0–100.0)

## 2021-10-20 DIAGNOSIS — E559 Vitamin D deficiency, unspecified: Secondary | ICD-10-CM | POA: Insufficient documentation

## 2021-10-20 DIAGNOSIS — E1159 Type 2 diabetes mellitus with other circulatory complications: Secondary | ICD-10-CM | POA: Insufficient documentation

## 2021-10-20 DIAGNOSIS — I152 Hypertension secondary to endocrine disorders: Secondary | ICD-10-CM | POA: Insufficient documentation

## 2021-10-20 NOTE — Progress Notes (Signed)
Chief Complaint:   OBESITY Krista Cooper is here to discuss her progress with her obesity treatment plan along with follow-up of her obesity related diagnoses. Krista Cooper is on the Category 3 Plan and states she is following her eating plan approximately 90-95% of the time. Krista Cooper states she is walking and going to [facility] 30 minutes 7 times per week.  Today's visit was #: 6 Starting weight: 202 lbs Starting date: 06/24/2021 Today's weight: 194 lbs Today's date: 10/14/2021 Total lbs lost to date: 8 lbs Total lbs lost since last in-office visit: 2 lbs  Interim History: Krista Cooper is enjoying Category 3 and learning how to incorporate indulgent  eating sporadically.  She is planning to continue current activity regimen and Category 3.  No upcoming obstacles.   Subjective:   1. Hypertension associated with type 2 diabetes mellitus ([place]) Blood pressure well controlled today.  Krista Cooper denies any chest pain, chest pressure, or headaches.  2. Type 2 diabetes mellitus with hyperglycemia, without long-term current use of insulin (HCC) Last A1c was 6.5, not on any medications at this time.   3. Hyperlipidemia associated with type 2 diabetes mellitus ([place]) Krista Cooper is on Zocor and CoQ10, her last LDL was 109.  4. Vitamin D deficiency Krista Cooper is currently taking Vitamin D, but complains of fatigue.  Last Vitamin D level was 39.  Assessment/Plan:   1. Hypertension associated with type 2 diabetes mellitus ([place]) Obtain labs today.  See below.   - Comprehensive metabolic panel  2. Type 2 diabetes mellitus with hyperglycemia, without long-term current use of insulin ([place]) Obtain labs today.  See below.   - Hemoglobin A1c - Insulin, random  3. Hyperlipidemia associated with type 2 diabetes mellitus ([place]) Obtain labs today. See below.  - Lipid Panel With LDL/HDL Ratio  4. Vitamin D deficiency Obtain labs today.  See below.    - VITAMIN D 25 Hydroxy (Vit-D Deficiency, Fractures)  5. Obesity,  current BMI 31.4 Krista Cooper is currently in the action stage of change. As such, her goal is to continue with weight loss efforts. She has agreed to the Category 3 Plan.   Exercise goals:  As is.  Behavioral modification strategies: increasing lean protein intake, decreasing simple carbohydrates, no skipping meals, meal planning and cooking strategies, and keeping healthy foods in the home.  Krista Cooper has agreed to follow-up with our clinic in 3 weeks. She was informed of the importance of frequent follow-up visits to maximize her success with intensive lifestyle modifications for her multiple health conditions.   Krista Cooper was informed we would discuss her lab results at her next visit unless there is a critical issue that needs to be addressed sooner. Krista Cooper agreed to keep her next visit at the agreed upon time to discuss these results.  Objective:   Blood pressure 138/83, pulse 92, temperature 98.4 F (36.9 C), height 5\' 6"  (1.676 m), weight 194 lb (88 kg), SpO2 99 %. Body mass index is 31.31 kg/m.  General: Cooperative, alert, well developed, in no acute distress. HEENT: Conjunctivae and lids unremarkable. Cardiovascular: Regular rhythm.  Lungs: Normal work of breathing. Neurologic: No focal deficits.   Lab Results  Component Value Date   CREATININE 0.65 10/14/2021   BUN 23 10/14/2021   NA 135 10/14/2021   K 4.4 10/14/2021   CL 96 10/14/2021   CO2 24 10/14/2021   Lab Results  Component Value Date   ALT 29 10/14/2021   AST 30 10/14/2021   ALKPHOS 82 10/14/2021   BILITOT  0.5 10/14/2021   Lab Results  Component Value Date   HGBA1C 6.4 (H) 10/14/2021   HGBA1C 6.5 (H) 06/24/2021   HGBA1C 6.3 (H) 02/20/2021   HGBA1C 6.0 (H) 10/13/2019   HGBA1C 6.1 (H) 09/30/2018   Lab Results  Component Value Date   INSULIN 9.0 10/14/2021   INSULIN 11.3 06/24/2021   Lab Results  Component Value Date   TSH 2.120 06/24/2021   Lab Results  Component Value Date   CHOL 230 (H) 10/14/2021    HDL 68 10/14/2021   LDLCALC 147 (H) 10/14/2021   TRIG 89 10/14/2021   CHOLHDL 2.9 02/20/2021   Lab Results  Component Value Date   VD25OH 58.7 10/14/2021   VD25OH 39.9 06/24/2021   VD25OH 41.6 02/20/2021   Lab Results  Component Value Date   WBC 4.9 06/24/2021   HGB 13.6 06/24/2021   HCT 41.5 06/24/2021   MCV 88 06/24/2021   PLT 373 06/24/2021   No results found for: IRON, TIBC, FERRITIN  Obesity Behavioral Intervention:   Approximately 15 minutes were spent on the discussion below.  ASK: We discussed the diagnosis of obesity with Hoyle Sauer today and Zirah agreed to give [place] permission to discuss obesity behavioral modification therapy today.  ASSESS: Kaymani has the diagnosis of obesity and her BMI today is 31.4. Hanna is in the action stage of change.   ADVISE: Kaoir was educated on the multiple health risks of obesity as well as the benefit of weight loss to improve her health. She was advised of the need for long term treatment and the importance of lifestyle modifications to improve her current health and to decrease her risk of future health problems.  AGREE: Multiple dietary modification options and treatment options were discussed and Marla agreed to follow the recommendations documented in the above note.  ARRANGE: Candle was educated on the importance of frequent visits to treat obesity as outlined per CMS and USPSTF guidelines and agreed to schedule her next follow up appointment today.  Attestation Statements:   Reviewed by clinician on day of visit: allergies, medications, problem list, medical history, surgical history, family history, social history, and previous encounter notes.  I, Davy Pique, RMA, am acting as transcriptionist for Coralie Common, MD.  I have reviewed the above documentation for accuracy and completeness, and I agree with the above. - Coralie Common, MD

## 2021-11-03 ENCOUNTER — Other Ambulatory Visit: Payer: Self-pay | Admitting: Family Medicine

## 2021-11-03 DIAGNOSIS — Z1231 Encounter for screening mammogram for malignant neoplasm of breast: Secondary | ICD-10-CM

## 2021-11-06 ENCOUNTER — Encounter (INDEPENDENT_AMBULATORY_CARE_PROVIDER_SITE_OTHER): Payer: Self-pay | Admitting: Family Medicine

## 2021-11-06 ENCOUNTER — Ambulatory Visit (INDEPENDENT_AMBULATORY_CARE_PROVIDER_SITE_OTHER): Payer: Medicare Other | Admitting: Family Medicine

## 2021-11-06 VITALS — BP 147/78 | HR 84 | Temp 98.4°F | Ht 66.0 in | Wt 196.0 lb

## 2021-11-06 DIAGNOSIS — E669 Obesity, unspecified: Secondary | ICD-10-CM | POA: Diagnosis not present

## 2021-11-06 DIAGNOSIS — E1159 Type 2 diabetes mellitus with other circulatory complications: Secondary | ICD-10-CM

## 2021-11-06 DIAGNOSIS — I152 Hypertension secondary to endocrine disorders: Secondary | ICD-10-CM | POA: Diagnosis not present

## 2021-11-06 DIAGNOSIS — Z6831 Body mass index (BMI) 31.0-31.9, adult: Secondary | ICD-10-CM

## 2021-11-06 DIAGNOSIS — E66811 Obesity, class 1: Secondary | ICD-10-CM

## 2021-11-06 DIAGNOSIS — E1165 Type 2 diabetes mellitus with hyperglycemia: Secondary | ICD-10-CM

## 2021-11-06 MED ORDER — BD PEN NEEDLE NANO 2ND GEN 32G X 4 MM MISC
1.0000 | Freq: Two times a day (BID) | 0 refills | Status: AC
Start: 2021-11-06 — End: ?

## 2021-11-06 MED ORDER — OZEMPIC (0.25 OR 0.5 MG/DOSE) 2 MG/3ML ~~LOC~~ SOPN: 0.2500 mg | PEN_INJECTOR | SUBCUTANEOUS | 0 refills | Status: DC

## 2021-11-10 NOTE — Progress Notes (Signed)
Chief Complaint:   OBESITY Krista Cooper is here to discuss her progress with her obesity treatment plan along with follow-up of her obesity related diagnoses. Krista Cooper is on the Category 3 Plan and states she is following her eating plan approximately 90-95% of the time. Krista Cooper states she is walking/weights 30 minutes 7 times per week.  Today's visit was #: 7 Starting weight: 202 lbs Starting date: 06/24/2021 Today's weight: 196 lbs Today's date: 11/06/2021 Total lbs lost to date: 6 lbs Total lbs lost since last in-office visit: 2  Interim History: Krista Cooper ate some blackberry cobbler and 2 desserts while eating out for [employer]. She had labs done last appointment. May be interested in starting medications for diabetes mellitus.  Subjective:   1. Type 2 diabetes mellitus with hyperglycemia, without long-term current use of insulin (HCC) Krista Cooper is not currently on medication. She previously deferred medication until repeat of A1c. A1c not much improved with lifestyle changes.  2. Hypertension associated with type 2 diabetes mellitus (HCC) Krista Cooper blood pressure slightly elevated today. Denies CP/Ch/press. She is on Maxzide-25.  Assessment/Plan:   1. Type 2 diabetes mellitus with hyperglycemia, without long-term current use of insulin (HCC) Krista Cooper to [employer] Ozempic 0.25 mg SubQ weekly for 1 month with 0 refills.  -Start Semaglutide,0.25 or 0.5MG /DOS, (OZEMPIC, 0.25 OR 0.5 MG/DOSE,) 2 MG/3ML SOPN; Inject 0.25 mg into the skin once a week.  Dispense: 3 mL; Refill: 0   -Fill Insulin Pen Needle (BD PEN NEEDLE NANO 2ND GEN) 32G X 4 MM MISC; 1 Package by Does not apply route 2 (two) times daily.  Dispense: 100 each; Refill: 0  2. Hypertension associated with type 2 diabetes mellitus (HCC) Krista Cooper continue current medication- no changes in dose of current medication.  3. Obesity, current BMI 31.7 Krista Cooper is currently in the action stage of change. As such, her goal is to continue with  weight loss efforts. She has agreed to the Category 3 Plan.   Exercise goals: All adults should avoid inactivity. Some physical activity is better than none, and adults who participate in any amount of physical activity gain some health benefits.  Behavioral modification strategies: increasing lean protein intake, meal planning and cooking strategies, and keeping healthy foods in the home.  Krista Cooper has agreed to follow-up with our clinic in 3 weeks. She was informed of the importance of frequent follow-up visits to maximize her success with intensive lifestyle modifications for her multiple health conditions.   Objective:   Blood pressure (!) 147/78, pulse 84, temperature 98.4 F (36.9 C), height 5\' 6"  (1.676 m), weight 196 lb (88.9 kg), SpO2 97 %. Body mass index is 31.64 kg/m.  General: Cooperative, alert, well developed, in no acute distress. HEENT: Conjunctivae and lids unremarkable. Cardiovascular: Regular rhythm.  Lungs: Normal work of breathing. Neurologic: No focal deficits.   Lab Results  Component Value Date   CREATININE 0.65 10/14/2021   BUN 23 10/14/2021   NA 135 10/14/2021   K 4.4 10/14/2021   CL 96 10/14/2021   CO2 24 10/14/2021   Lab Results  Component Value Date   ALT 29 10/14/2021   AST 30 10/14/2021   ALKPHOS 82 10/14/2021   BILITOT 0.5 10/14/2021   Lab Results  Component Value Date   HGBA1C 6.4 (H) 10/14/2021   HGBA1C 6.5 (H) 06/24/2021   HGBA1C 6.3 (H) 02/20/2021   HGBA1C 6.0 (H) 10/13/2019   HGBA1C 6.1 (H) 09/30/2018   Lab Results  Component Value Date   INSULIN  9.0 10/14/2021   INSULIN 11.3 06/24/2021   Lab Results  Component Value Date   TSH 2.120 06/24/2021   Lab Results  Component Value Date   CHOL 230 (H) 10/14/2021   HDL 68 10/14/2021   LDLCALC 147 (H) 10/14/2021   TRIG 89 10/14/2021   CHOLHDL 2.9 02/20/2021   Lab Results  Component Value Date   VD25OH 58.7 10/14/2021   VD25OH 39.9 06/24/2021   VD25OH 41.6 02/20/2021   Lab  Results  Component Value Date   WBC 4.9 06/24/2021   HGB 13.6 06/24/2021   HCT 41.5 06/24/2021   MCV 88 06/24/2021   PLT 373 06/24/2021   No results found for: "IRON", "TIBC", "FERRITIN"  Attestation Statements:   Reviewed by clinician on day of visit: allergies, medications, problem list, medical history, surgical history, family history, social history, and previous encounter notes.  I, Fortino Sic, RMA am acting as transcriptionist for Reuben Likes, MD.  I have reviewed the above documentation for accuracy and completeness, and I agree with the above. - Reuben Likes, MD

## 2021-12-04 ENCOUNTER — Encounter (INDEPENDENT_AMBULATORY_CARE_PROVIDER_SITE_OTHER): Payer: Self-pay | Admitting: Family Medicine

## 2021-12-04 ENCOUNTER — Ambulatory Visit (INDEPENDENT_AMBULATORY_CARE_PROVIDER_SITE_OTHER): Payer: Medicare Other | Admitting: Family Medicine

## 2021-12-04 VITALS — BP 118/74 | HR 73 | Temp 98.2°F | Ht 66.0 in | Wt 194.0 lb

## 2021-12-04 DIAGNOSIS — E1165 Type 2 diabetes mellitus with hyperglycemia: Secondary | ICD-10-CM

## 2021-12-04 DIAGNOSIS — E785 Hyperlipidemia, unspecified: Secondary | ICD-10-CM

## 2021-12-04 DIAGNOSIS — E1169 Type 2 diabetes mellitus with other specified complication: Secondary | ICD-10-CM

## 2021-12-04 DIAGNOSIS — E669 Obesity, unspecified: Secondary | ICD-10-CM | POA: Diagnosis not present

## 2021-12-04 DIAGNOSIS — Z7985 Long-term (current) use of injectable non-insulin antidiabetic drugs: Secondary | ICD-10-CM

## 2021-12-04 DIAGNOSIS — Z6831 Body mass index (BMI) 31.0-31.9, adult: Secondary | ICD-10-CM

## 2021-12-04 MED ORDER — OZEMPIC (0.25 OR 0.5 MG/DOSE) 2 MG/3ML ~~LOC~~ SOPN: 0.5000 mg | PEN_INJECTOR | SUBCUTANEOUS | 0 refills | Status: DC

## 2021-12-21 ENCOUNTER — Other Ambulatory Visit (INDEPENDENT_AMBULATORY_CARE_PROVIDER_SITE_OTHER): Payer: Self-pay | Admitting: Family Medicine

## 2021-12-21 DIAGNOSIS — E1165 Type 2 diabetes mellitus with hyperglycemia: Secondary | ICD-10-CM

## 2021-12-24 ENCOUNTER — Encounter (INDEPENDENT_AMBULATORY_CARE_PROVIDER_SITE_OTHER): Payer: Self-pay

## 2021-12-26 ENCOUNTER — Ambulatory Visit
Admission: RE | Admit: 2021-12-26 | Discharge: 2021-12-26 | Disposition: A | Discharge status: Home / Self Care | Payer: Medicare Other | Source: Ambulatory Visit | Attending: Family Medicine | Admitting: Family Medicine

## 2021-12-26 DIAGNOSIS — Z1231 Encounter for screening mammogram for malignant neoplasm of breast: Secondary | ICD-10-CM | POA: Diagnosis not present

## 2021-12-29 NOTE — Progress Notes (Signed)
Hi Jaysa  Normal mammogram; repeat in 1 year.  Please let us know if you have any questions.  Thank you,  Merita Norton, FNP

## 2021-12-30 NOTE — Progress Notes (Signed)
Chief Complaint:   OBESITY Krista Cooper is here to discuss her progress with her obesity treatment plan along with follow-up of her obesity related diagnoses. Krista Cooper is on the Category 3 Plan and states she is following her eating plan approximately 95% of the time. Krista Cooper states she is walking 30 minutes 2-7 times per week.  Today's visit was #: 8 Starting weight: 202 lbs Starting date: 06/24/2021 Today's weight: 194 lbs Today's date: 12/04/2021 Total lbs lost to date: 8 lbs Total lbs lost since last in-office visit: 2  Interim History: Krista Cooper went to the beach for 3 days at the end of June with a friend and stayed on meal plan majority of the time. Walking daily and doing weight machines at least twice a week. She is not sure what she will do for her birthday in the next 6 days. She thinks Cat 3 will be easiest to adhere to.  Subjective:   1. Type 2 diabetes mellitus with hyperglycemia, without long-term current use of insulin (HCC) Krista Cooper is currently on Ozempic with minimal side effects. Did erroneously inject medicine 2 times.  2. Hyperlipidemia associated with type 2 diabetes mellitus (HCC) Krista Cooper is currently taking Zocor with no myalgias or transaminitis.  Assessment/Plan:   1. Type 2 diabetes mellitus with hyperglycemia, without long-term current use of insulin (HCC) We will refill/increase Ozempic to 0.5 mg once weekly for 1 month with 0 refills.  -Refill/increase Semaglutide,0.25 or 0.5MG /DOS, (OZEMPIC, 0.25 OR 0.5 MG/DOSE,) 2 MG/3ML SOPN; Inject 0.5 mg into the skin once a week.  Dispense: 3 mL; Refill: 0  2. Hyperlipidemia associated with type 2 diabetes mellitus (HCC) Continue taking Zocor without changes in dose. We will repeat labs in 2 months.  3. Obesity, current BMI 31.3 Krista Cooper is currently in the action stage of change. As such, her goal is to continue with weight loss efforts. She has agreed to the Category 3 Plan.   Exercise goals: As is. Krista Cooper is to  monitor weight and reps when lifting weights.  Behavioral modification strategies: increasing lean protein intake, meal planning and cooking strategies, keeping healthy foods in the home, and planning for success.  Krista Cooper has agreed to follow-up with our clinic in 4 weeks. She was informed of the importance of frequent follow-up visits to maximize her success with intensive lifestyle modifications for her multiple health conditions.   Objective:   Blood pressure 118/74, pulse 73, temperature 98.2 F (36.8 C), height 5\' 6"  (1.676 m), weight 194 lb (88 kg), SpO2 96 %. Body mass index is 31.31 kg/m.  General: Cooperative, alert, well developed, in no acute distress. HEENT: Conjunctivae and lids unremarkable. Cardiovascular: Regular rhythm.  Lungs: Normal work of breathing. Neurologic: No focal deficits.   Lab Results  Component Value Date   CREATININE 0.65 10/14/2021   BUN 23 10/14/2021   NA 135 10/14/2021   K 4.4 10/14/2021   CL 96 10/14/2021   CO2 24 10/14/2021   Lab Results  Component Value Date   ALT 29 10/14/2021   AST 30 10/14/2021   ALKPHOS 82 10/14/2021   BILITOT 0.5 10/14/2021   Lab Results  Component Value Date   HGBA1C 6.4 (H) 10/14/2021   HGBA1C 6.5 (H) 06/24/2021   HGBA1C 6.3 (H) 02/20/2021   HGBA1C 6.0 (H) 10/13/2019   HGBA1C 6.1 (H) 09/30/2018   Lab Results  Component Value Date   INSULIN 9.0 10/14/2021   INSULIN 11.3 06/24/2021   Lab Results  Component Value Date  TSH 2.120 06/24/2021   Lab Results  Component Value Date   CHOL 230 (H) 10/14/2021   HDL 68 10/14/2021   LDLCALC 147 (H) 10/14/2021   TRIG 89 10/14/2021   CHOLHDL 2.9 02/20/2021   Lab Results  Component Value Date   VD25OH 58.7 10/14/2021   VD25OH 39.9 06/24/2021   VD25OH 41.6 02/20/2021   Lab Results  Component Value Date   WBC 4.9 06/24/2021   HGB 13.6 06/24/2021   HCT 41.5 06/24/2021   MCV 88 06/24/2021   PLT 373 06/24/2021   No results found for: "IRON", "TIBC",  "FERRITIN"  Attestation Statements:   Reviewed by clinician on day of visit: allergies, medications, problem list, medical history, surgical history, family history, social history, and previous encounter notes.  I, Fortino Sic, RMA am acting as transcriptionist for Reuben Likes, MD.  I have reviewed the above documentation for accuracy and completeness, and I agree with the above. - Reuben Likes, MD

## 2022-01-06 ENCOUNTER — Ambulatory Visit (INDEPENDENT_AMBULATORY_CARE_PROVIDER_SITE_OTHER): Payer: Medicare Other | Admitting: Family Medicine

## 2022-01-06 ENCOUNTER — Encounter (INDEPENDENT_AMBULATORY_CARE_PROVIDER_SITE_OTHER): Payer: Self-pay | Admitting: Family Medicine

## 2022-01-06 VITALS — BP 151/77 | HR 97 | Temp 98.1°F | Ht 66.0 in | Wt 197.0 lb

## 2022-01-06 DIAGNOSIS — I152 Hypertension secondary to endocrine disorders: Secondary | ICD-10-CM

## 2022-01-06 DIAGNOSIS — E1165 Type 2 diabetes mellitus with hyperglycemia: Secondary | ICD-10-CM | POA: Diagnosis not present

## 2022-01-06 DIAGNOSIS — E1159 Type 2 diabetes mellitus with other circulatory complications: Secondary | ICD-10-CM

## 2022-01-06 DIAGNOSIS — E669 Obesity, unspecified: Secondary | ICD-10-CM | POA: Diagnosis not present

## 2022-01-06 DIAGNOSIS — Z6831 Body mass index (BMI) 31.0-31.9, adult: Secondary | ICD-10-CM

## 2022-01-06 DIAGNOSIS — Z7985 Long-term (current) use of injectable non-insulin antidiabetic drugs: Secondary | ICD-10-CM

## 2022-01-06 MED ORDER — OZEMPIC (0.25 OR 0.5 MG/DOSE) 2 MG/3ML ~~LOC~~ SOPN: 0.5000 mg | PEN_INJECTOR | SUBCUTANEOUS | 0 refills | Status: DC

## 2022-01-20 NOTE — Progress Notes (Signed)
Chief Complaint:   OBESITY Krista Cooper is here to discuss her progress with her obesity treatment plan along with follow-up of her obesity related diagnoses. Cabella is on the Category 3 Plan and states she is following her eating plan approximately 90% of the time. Venna states she is walking 30-45 minutes 2-7 times per week.  Today's visit was #: 9 Starting weight: 202 lbs Starting date: 06/24/2021 Today's weight: 197 lbs Today's date: 01/06/2022 Total lbs lost to date: 5 lbs Total lbs lost since last in-office visit: 0  Interim History: Krista Cooper has has a difficult day in terms of changes in her church and today being the 19th anniversary of her daughters death. She had a few indulgences and did not exercise as much. She enjoys the meal plan. Still feeling more full on Ozempic. No plans for Labor Day weekend.  Subjective:   1. Type 2 diabetes mellitus with hyperglycemia, without long-term current use of insulin (HCC) Krista Cooper is on Ozempic 0.5 mg, she is still experiencing satiety. Last A1c was 6.4.  2. Hypertension associated with diabetes (HCC) Krista Cooper's blood pressure slightly elevated today but she's very upset. Denies chest pain, chest pressure and headache.  Assessment/Plan:   1. Type 2 diabetes mellitus with hyperglycemia, without long-term current use of insulin (HCC) We will refill Ozempic 0.5 mg SubQ once weekly for 1 month with 0 refills.  -Refill Semaglutide,0.25 or 0.5MG /DOS, (OZEMPIC, 0.25 OR 0.5 MG/DOSE,) 2 MG/3ML SOPN; Inject 0.5 mg into the skin once a week.  Dispense: 3 mL; Refill: 0  2. Hypertension associated with diabetes (HCC) Krista Cooper will continue current medications without changes in dose or medications. Follow up on blood pressure at next apponitnment.  3. Obesity, current BMI 31.9 Krista Cooper is currently in the action stage of change. As such, her goal is to continue with weight loss efforts. She has agreed to the Category 3 Plan.   Exercise goals: All  adults should avoid inactivity. Some physical activity is better than none, and adults who participate in any amount of physical activity gain some health benefits.  Behavioral modification strategies: increasing lean protein intake, meal planning and cooking strategies, keeping healthy foods in the home, and planning for success.  Krista Cooper has agreed to follow-up with our clinic in 3 weeks. She was informed of the importance of frequent follow-up visits to maximize her success with intensive lifestyle modifications for her multiple health conditions.   Objective:   Blood pressure (!) 151/77, pulse 97, temperature 98.1 F (36.7 C), height 5\' 6"  (1.676 m), weight 197 lb (89.4 kg), SpO2 97 %. Body mass index is 31.8 kg/m.  General: Cooperative, alert, well developed, in no acute distress. HEENT: Conjunctivae and lids unremarkable. Cardiovascular: Regular rhythm.  Lungs: Normal work of breathing. Neurologic: No focal deficits.   Lab Results  Component Value Date   CREATININE 0.65 10/14/2021   BUN 23 10/14/2021   NA 135 10/14/2021   K 4.4 10/14/2021   CL 96 10/14/2021   CO2 24 10/14/2021   Lab Results  Component Value Date   ALT 29 10/14/2021   AST 30 10/14/2021   ALKPHOS 82 10/14/2021   BILITOT 0.5 10/14/2021   Lab Results  Component Value Date   HGBA1C 6.4 (H) 10/14/2021   HGBA1C 6.5 (H) 06/24/2021   HGBA1C 6.3 (H) 02/20/2021   HGBA1C 6.0 (H) 10/13/2019   HGBA1C 6.1 (H) 09/30/2018   Lab Results  Component Value Date   INSULIN 9.0 10/14/2021   INSULIN 11.3 06/24/2021  Lab Results  Component Value Date   TSH 2.120 06/24/2021   Lab Results  Component Value Date   CHOL 230 (H) 10/14/2021   HDL 68 10/14/2021   LDLCALC 147 (H) 10/14/2021   TRIG 89 10/14/2021   CHOLHDL 2.9 02/20/2021   Lab Results  Component Value Date   VD25OH 58.7 10/14/2021   VD25OH 39.9 06/24/2021   VD25OH 41.6 02/20/2021   Lab Results  Component Value Date   WBC 4.9 06/24/2021   HGB  13.6 06/24/2021   HCT 41.5 06/24/2021   MCV 88 06/24/2021   PLT 373 06/24/2021   No results found for: "IRON", "TIBC", "FERRITIN"  Attestation Statements:   Reviewed by clinician on day of visit: allergies, medications, problem list, medical history, surgical history, family history, social history, and previous encounter notes.  I, Fortino Sic, RMA am acting as transcriptionist for Reuben Likes, MD.  I have reviewed the above documentation for accuracy and completeness, and I agree with the above. - Reuben Likes, MD

## 2022-02-09 ENCOUNTER — Ambulatory Visit (INDEPENDENT_AMBULATORY_CARE_PROVIDER_SITE_OTHER): Payer: Medicare Other | Admitting: Family Medicine

## 2022-02-09 ENCOUNTER — Encounter (INDEPENDENT_AMBULATORY_CARE_PROVIDER_SITE_OTHER): Payer: Self-pay | Admitting: Family Medicine

## 2022-02-09 VITALS — BP 138/81 | HR 98 | Temp 98.4°F | Ht 66.0 in | Wt 195.0 lb

## 2022-02-09 DIAGNOSIS — E1165 Type 2 diabetes mellitus with hyperglycemia: Secondary | ICD-10-CM | POA: Diagnosis not present

## 2022-02-09 DIAGNOSIS — E669 Obesity, unspecified: Secondary | ICD-10-CM

## 2022-02-09 DIAGNOSIS — E1159 Type 2 diabetes mellitus with other circulatory complications: Secondary | ICD-10-CM

## 2022-02-09 DIAGNOSIS — I152 Hypertension secondary to endocrine disorders: Secondary | ICD-10-CM | POA: Diagnosis not present

## 2022-02-09 DIAGNOSIS — Z7985 Long-term (current) use of injectable non-insulin antidiabetic drugs: Secondary | ICD-10-CM

## 2022-02-09 DIAGNOSIS — Z6831 Body mass index (BMI) 31.0-31.9, adult: Secondary | ICD-10-CM

## 2022-02-11 NOTE — Progress Notes (Unsigned)
Chief Complaint:   OBESITY Krista Cooper is here to discuss her progress with her obesity treatment plan along with follow-up of her obesity related diagnoses. Krista Cooper is on the Category 3 Plan and states she is following her eating plan approximately 85% of the time. Krista Cooper states she is walking 30 minutes 7 times per week.  Today's visit was #: 10 Starting weight: 202 lbs Starting date: 06/24/2021 Today's weight: 195 lbs Today's date: 02/09/2022 Total lbs lost to date: 7 lbs Total lbs lost since last in-office visit: 2  Interim History: Krista Cooper had a 10 day trip to the beach and did enjoy some down time. Going to [facility] 2x a week and walking 7 days. Was even able to go get a message over the last few weeks. She voices that she is dealing with disaffiliation of her church from [employer].  Subjective:   1. Type 2 diabetes mellitus with hyperglycemia, without long-term current use of insulin (HCC) Krista Cooper sees PCP Oct 23 for physical. On Ozempic. Denies GI side effects. Her last A1c 6.4.  2. Hypertension associated with diabetes (HCC) Krista Cooper's blood pressure is well control today. She is on Maxzide-25. Denies chest pain, chest pressure and headache.  Assessment/Plan:   1. Type 2 diabetes mellitus with hyperglycemia, without long-term current use of insulin (HCC) Continue Ozempic without any changes in dose.  2. Hypertension associated with diabetes (HCC) Continue current medications without changes in medication dose.  3. Obesity, current BMI 31.5 Krista Cooper is currently in the action stage of change. As such, her goal is to continue with weight loss efforts. She has agreed to the Category 3 Plan.   Exercise goals: All adults should avoid inactivity. Some physical activity is better than none, and adults who participate in any amount of physical activity gain some health benefits.  Krista Cooper will add two days at the [facility].  Behavioral modification strategies: increasing lean  protein intake, meal planning and cooking strategies, keeping healthy foods in the home, and planning for success.  Krista Cooper has agreed to follow-up with our clinic in 5 weeks. She was informed of the importance of frequent follow-up visits to maximize her success with intensive lifestyle modifications for her multiple health conditions.   Objective:   Blood pressure 138/81, pulse 98, temperature 98.4 F (36.9 C), height 5\' 6"  (1.676 m), weight 195 lb (88.5 kg), SpO2 100 %. Body mass index is 31.47 kg/m.  General: Cooperative, alert, well developed, in no acute distress. HEENT: Conjunctivae and lids unremarkable. Cardiovascular: Regular rhythm.  Lungs: Normal work of breathing. Neurologic: No focal deficits.   Lab Results  Component Value Date   CREATININE 0.65 10/14/2021   BUN 23 10/14/2021   NA 135 10/14/2021   K 4.4 10/14/2021   CL 96 10/14/2021   CO2 24 10/14/2021   Lab Results  Component Value Date   ALT 29 10/14/2021   AST 30 10/14/2021   ALKPHOS 82 10/14/2021   BILITOT 0.5 10/14/2021   Lab Results  Component Value Date   HGBA1C 6.4 (H) 10/14/2021   HGBA1C 6.5 (H) 06/24/2021   HGBA1C 6.3 (H) 02/20/2021   HGBA1C 6.0 (H) 10/13/2019   HGBA1C 6.1 (H) 09/30/2018   Lab Results  Component Value Date   INSULIN 9.0 10/14/2021   INSULIN 11.3 06/24/2021   Lab Results  Component Value Date   TSH 2.120 06/24/2021   Lab Results  Component Value Date   CHOL 230 (H) 10/14/2021   HDL 68 10/14/2021   LDLCALC  147 (H) 10/14/2021   TRIG 89 10/14/2021   CHOLHDL 2.9 02/20/2021   Lab Results  Component Value Date   VD25OH 58.7 10/14/2021   VD25OH 39.9 06/24/2021   VD25OH 41.6 02/20/2021   Lab Results  Component Value Date   WBC 4.9 06/24/2021   HGB 13.6 06/24/2021   HCT 41.5 06/24/2021   MCV 88 06/24/2021   PLT 373 06/24/2021   No results found for: "IRON", "TIBC", "FERRITIN"  Attestation Statements:   Reviewed by clinician on day of visit: allergies,  medications, problem list, medical history, surgical history, family history, social history, and previous encounter notes.  I, Elnora Morrison, RMA am acting as transcriptionist for Coralie Common, MD.  I have reviewed the above documentation for accuracy and completeness, and I agree with the above. - Coralie Common, MD

## 2022-02-19 ENCOUNTER — Telehealth: Payer: Self-pay | Admitting: Family Medicine

## 2022-02-19 DIAGNOSIS — E1165 Type 2 diabetes mellitus with hyperglycemia: Secondary | ICD-10-CM

## 2022-02-19 DIAGNOSIS — E1169 Type 2 diabetes mellitus with other specified complication: Secondary | ICD-10-CM

## 2022-02-19 DIAGNOSIS — E559 Vitamin D deficiency, unspecified: Secondary | ICD-10-CM

## 2022-02-19 DIAGNOSIS — I152 Hypertension secondary to endocrine disorders: Secondary | ICD-10-CM

## 2022-02-19 NOTE — Telephone Encounter (Signed)
Okay to order labs for her upcoming physical.  She should have these done at least 2 days prior to her appointment and they should be done fasting.  She is due for lipid panel, CMP, A1c

## 2022-02-19 NOTE — Telephone Encounter (Signed)
Patient requesting vitamin D lab also. Please advise.

## 2022-02-19 NOTE — Telephone Encounter (Signed)
Pt is calling to request orders for labs for AWV prior to the apt on 03/10/22. Please advise [phone]- [phone]

## 2022-02-20 NOTE — Telephone Encounter (Signed)
Patient advised labs approved to be done before her CPE.

## 2022-02-20 NOTE — Telephone Encounter (Signed)
Sure. Assoc with Vit D deficiency.

## 2022-03-04 DIAGNOSIS — E1159 Type 2 diabetes mellitus with other circulatory complications: Secondary | ICD-10-CM | POA: Diagnosis not present

## 2022-03-04 DIAGNOSIS — E1165 Type 2 diabetes mellitus with hyperglycemia: Secondary | ICD-10-CM | POA: Diagnosis not present

## 2022-03-04 DIAGNOSIS — E1169 Type 2 diabetes mellitus with other specified complication: Secondary | ICD-10-CM | POA: Diagnosis not present

## 2022-03-04 DIAGNOSIS — I152 Hypertension secondary to endocrine disorders: Secondary | ICD-10-CM | POA: Diagnosis not present

## 2022-03-05 LAB — COMPREHENSIVE METABOLIC PANEL
ALT: 29 IU/L (ref 0–32)
AST: 32 IU/L (ref 0–40)
Albumin/Globulin Ratio: 2.5 — ABNORMAL HIGH (ref 1.2–2.2)
Albumin: 4.7 g/dL (ref 3.9–4.9)
Alkaline Phosphatase: 77 IU/L (ref 44–121)
BUN/Creatinine Ratio: 33 — ABNORMAL HIGH (ref 12–28)
BUN: 21 mg/dL (ref 8–27)
Bilirubin Total: 0.4 mg/dL (ref 0.0–1.2)
CO2: 24 mmol/L (ref 20–29)
Calcium: 9.6 mg/dL (ref 8.7–10.3)
Chloride: 99 mmol/L (ref 96–106)
Creatinine, Ser: 0.64 mg/dL (ref 0.57–1.00)
Globulin, Total: 1.9 g/dL (ref 1.5–4.5)
Glucose: 102 mg/dL — ABNORMAL HIGH (ref 70–99)
Potassium: 4.3 mmol/L (ref 3.5–5.2)
Sodium: 137 mmol/L (ref 134–144)
Total Protein: 6.6 g/dL (ref 6.0–8.5)
eGFR: 97 mL/min/{1.73_m2} (ref >59)

## 2022-03-05 LAB — LIPID PANEL WITH LDL/HDL RATIO
Cholesterol, Total: 213 mg/dL — ABNORMAL HIGH (ref 100–199)
HDL: 62 mg/dL (ref >39)
LDL Chol Calc (NIH): 137 mg/dL — ABNORMAL HIGH (ref 0–99)
LDL/HDL Ratio: 2.2 ratio (ref 0.0–3.2)
Triglycerides: 80 mg/dL (ref 0–149)
VLDL Cholesterol Cal: 14 mg/dL (ref 5–40)

## 2022-03-05 LAB — VITAMIN D 25 HYDROXY (VIT D DEFICIENCY, FRACTURES): Vit D, 25-Hydroxy: 48.6 ng/mL (ref 30.0–100.0)

## 2022-03-05 LAB — HEMOGLOBIN A1C
Est. average glucose Bld gHb Est-mCnc: 134 mg/dL
Hgb A1c MFr Bld: 6.3 % — ABNORMAL HIGH (ref 4.8–5.6)

## 2022-03-10 ENCOUNTER — Ambulatory Visit (INDEPENDENT_AMBULATORY_CARE_PROVIDER_SITE_OTHER): Payer: Medicare Other | Admitting: Family Medicine

## 2022-03-10 ENCOUNTER — Encounter: Payer: Self-pay | Admitting: Family Medicine

## 2022-03-10 VITALS — BP 136/74 | HR 84 | Resp 16 | Ht 66.0 in | Wt 194.0 lb

## 2022-03-10 DIAGNOSIS — E1169 Type 2 diabetes mellitus with other specified complication: Secondary | ICD-10-CM

## 2022-03-10 DIAGNOSIS — F419 Anxiety disorder, unspecified: Secondary | ICD-10-CM

## 2022-03-10 DIAGNOSIS — Z23 Encounter for immunization: Secondary | ICD-10-CM

## 2022-03-10 DIAGNOSIS — J302 Other seasonal allergic rhinitis: Secondary | ICD-10-CM

## 2022-03-10 DIAGNOSIS — I152 Hypertension secondary to endocrine disorders: Secondary | ICD-10-CM

## 2022-03-10 DIAGNOSIS — E1159 Type 2 diabetes mellitus with other circulatory complications: Secondary | ICD-10-CM

## 2022-03-10 DIAGNOSIS — E785 Hyperlipidemia, unspecified: Secondary | ICD-10-CM

## 2022-03-10 MED ORDER — FLUTICASONE PROPIONATE 50 MCG/ACT NA SUSP: 2.0000 | Freq: Every day | NASAL | 2 refills | Status: DC

## 2022-03-10 MED ORDER — ALPRAZOLAM 0.25 MG PO TABS: 0.2500 mg | ORAL_TABLET | Freq: Two times a day (BID) | ORAL | 0 refills | Status: DC | PRN

## 2022-03-10 MED ORDER — ROSUVASTATIN CALCIUM 5 MG PO TABS: 5.0000 mg | ORAL_TABLET | Freq: Every day | ORAL | 0 refills | Status: DC

## 2022-03-10 NOTE — Progress Notes (Signed)
BP 136/74 (BP Location: Left Arm, Patient Position: Sitting, Cuff Size: Large)   Pulse 84   Resp 16   Ht 5' 6" (1.676 m)   Wt 194 lb (88 kg)   SpO2 99%   BMI 31.31 kg/m    Subjective:    Patient ID: Krista Cooper, female    DOB: [birth year], 66 y.o.   MRN: [id number]  HPI: Krista Cooper is a 66 y.o. female presenting on 03/10/2022 for comprehensive medical examination. Current medical complaints include:none  Hypertension: - Medications: maxzide - Compliance: good - Checking BP at home: yes 110-130s SBP. - Denies any SOB, CP, vision changes, LE edema, medication SEs, or symptoms of hypotension - Exercise: walks daily  Diabetes, Type 2 - Last A1c 6.3 02/2022 - Medications: ozempic - Compliance: good - Checking BG at home: no - Exercise: walks daily - Eye exam: due, has appt scheduled. [employer] exam: UTD - Microalbumin: UTD - Statin: yes - PNA vaccine: UTD - Denies symptoms of hypoglycemia, polyuria, polydipsia, numbness extremities, foot ulcers/trauma  Depression Screen done today and results listed below:     03/10/2022   10:26 AM 03/10/2022   10:25 AM 08/26/2021   11:05 AM 06/24/2021    8:44 AM 03/07/2021   10:00 AM  Depression screen PHQ 2/9  Decreased Interest 0 0 0 0 0  Down, Depressed, Hopeless 0 0 0 1 0  PHQ - 2 Score 0 0 0 1 0  Altered sleeping 0 0 0 0 1  Tired, decreased energy 0 0 0 1 0  Change in appetite 0 0 0 0 0  Feeling bad or failure about yourself  0 0 0 1 0  Trouble concentrating 0 0 0 0 0  Moving slowly or fidgety/restless 0 0 0 0 0  Suicidal thoughts 0 0 0 0 0  PHQ-9 Score 0 0 0 3 1  Difficult doing work/chores Not difficult at all Not difficult at all Not difficult at all Not difficult at all Not difficult at all    The patient does not have a history of falls. I did not complete a risk assessment for falls. A plan of care for falls was not documented.   Past Medical History:  Past Medical History:  Diagnosis  Date   Allergy    Anxiety    Hyperlipidemia    Hypertension    Obesity    Prediabetes    Seasonal allergies     Surgical History:  Past Surgical History:  Procedure Laterality Date   CESAREAN SECTION     FRACTURE SURGERY Right 05/19/1995   PLATES AND PINS   MOUTH SURGERY  05/18/1974   WISDOM TEETH REMOVED    Medications:  Current Outpatient Medications on File Prior to Visit  Medication Sig   B Complex Vitamins (VITAMIN B COMPLEX PO) Take 1 tablet by mouth daily.   Cholecalciferol (VITAMIN D3) 125 MCG (5000 UT) CAPS Take 1 capsule (5,000 Units total) by mouth daily.   Coenzyme Q10 (COQ10) 200 MG CAPS Take 200 mg by mouth daily with breakfast.   Insulin Pen Needle (BD PEN NEEDLE NANO 2ND GEN) 32G X 4 MM MISC 1 Package by Does not apply route 2 (two) times daily.   levocetirizine (XYZAL) 5 MG tablet Take 1 tablet (5 mg total) by mouth every evening.   MAGNESIUM GLYCINATE PO Take 240 mg by mouth.   MULTIPLE VITAMIN PO Take 1 tablet by mouth daily.   Omega-3 Fatty  Acids (FISH OIL) 1200 MG CAPS Take 2 capsules (2,400 mg total) by mouth daily.   Semaglutide,0.25 or 0.5MG/DOS, (OZEMPIC, 0.25 OR 0.5 MG/DOSE,) 2 MG/3ML SOPN Inject 0.5 mg into the skin once a week.   sertraline (ZOLOFT) 50 MG tablet Take 1 tablet (50 mg total) by mouth daily.   triamterene-hydrochlorothiazide (MAXZIDE-25) 37.5-25 MG tablet Take 1 tablet by mouth daily.   No current facility-administered medications on file prior to visit.    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    Employer: [employer]   Occupation: retired   Tobacco Use   Smoking status: Never   Smokeless tobacco: Never  Vaping Use   Vaping Use: Never used  Substance and Sexual Activity   Alcohol use: Yes    Comment: rare glass of wine   Drug use: No   Sexual activity: Not on file  Other Topics Concern   Not on file  Social History Narrative   Not on file   Social Determinants of Health   Financial Resource Strain: Not on file  Food Insecurity: Not on file  Transportation Needs: Not on file  Physical Activity: Not on file  Stress: Not on file  Social Connections: Not on file  Intimate Partner Violence: Not on file   Social History   Tobacco Use  Smoking Status Never  Smokeless Tobacco Never   Social History   Substance and Sexual Activity  Alcohol Use Yes   Comment: rare glass of wine    Family History:  Family History  Problem Relation Age of Onset   Diabetes Mother    Hyperlipidemia Mother    COPD Mother    Hypertension Mother    Cancer Father    Congestive Heart Failure Father    Hypertension Father    Diabetes Paternal Grandfather    CVA Maternal Grandmother    Breast cancer Neg Hx     Past medical history, surgical history, medications, allergies, family history and social history reviewed with patient today and changes made to appropriate areas of the chart.      Objective:    BP 136/74 (BP Location: Left Arm, Patient Position: Sitting, Cuff Size: Large)   Pulse 84   Resp 16   Ht 5' 6" (1.676 m)   Wt 194 lb (88 kg)   SpO2 99%   BMI 31.31 kg/m   Wt Readings from Last 3 Encounters:  03/10/22 194 lb (88 kg)  02/09/22 195 lb (88.5 kg)  01/06/22 197 lb (89.4 kg)    Physical Exam Vitals reviewed.  Constitutional:      Appearance: Normal appearance.  HENT:     Head: Normocephalic and atraumatic.     Right Ear: Tympanic membrane, ear canal and external ear normal.     Left Ear: Tympanic membrane, ear canal and external ear normal.     Nose: Nose normal.     Mouth/Throat:     Mouth: Mucous  membranes are moist.     Pharynx: Oropharynx is clear.  Eyes:  Extraocular Movements: Extraocular movements intact.     Pupils: Pupils are equal, round, and reactive to light.  Cardiovascular:     Rate and Rhythm: Normal rate and regular rhythm.     Pulses: Normal pulses.     Heart sounds: Normal heart sounds.  Pulmonary:     Effort: Pulmonary effort is normal.     Breath sounds: Normal breath sounds.  Abdominal:     General: Bowel sounds are normal.     Palpations: Abdomen is soft.     Tenderness: There is no abdominal tenderness.  Musculoskeletal:        General: Normal range of motion.     Right lower leg: No edema.     Left lower leg: No edema.  Lymphadenopathy:     Cervical: No cervical adenopathy.  Skin:    General: Skin is warm and dry.  Neurological:     Mental Status: She is alert and oriented to person, place, and time. Mental status is at baseline.     Gait: Gait normal.  Psychiatric:        Mood and Affect: Mood normal.        Behavior: Behavior normal.     Results for orders placed or performed in visit on 02/19/22  Comprehensive metabolic panel  Result Value Ref Range   Glucose 102 (H) 70 - 99 mg/dL   BUN 21 8 - 27 mg/dL   Creatinine, Ser 0.64 0.57 - 1.00 mg/dL   eGFR 97 >59 mL/min/1.73   BUN/Creatinine Ratio 33 (H) 12 - 28   Sodium 137 134 - 144 mmol/L   Potassium 4.3 3.5 - 5.2 mmol/L   Chloride 99 96 - 106 mmol/L   CO2 24 20 - 29 mmol/L   Calcium 9.6 8.7 - 10.3 mg/dL   Total Protein 6.6 6.0 - 8.5 g/dL   Albumin 4.7 3.9 - 4.9 g/dL   Globulin, Total 1.9 1.5 - 4.5 g/dL   Albumin/Globulin Ratio 2.5 (H) 1.2 - 2.2   Bilirubin Total 0.4 0.0 - 1.2 mg/dL   Alkaline Phosphatase 77 44 - 121 IU/L   AST 32 0 - 40 IU/L   ALT 29 0 - 32 IU/L  Hemoglobin A1c  Result Value Ref Range   Hgb A1c MFr Bld 6.3 (H) 4.8 - 5.6 %   Est. average glucose Bld gHb Est-mCnc 134 mg/dL  Lipid Panel With LDL/HDL Ratio  Result Value Ref Range   Cholesterol, Total 213 (H) 100  - 199 mg/dL   Triglycerides 80 0 - 149 mg/dL   HDL 62 >39 mg/dL   VLDL Cholesterol Cal 14 5 - 40 mg/dL   LDL Chol Calc (NIH) 137 (H) 0 - 99 mg/dL   LDL/HDL Ratio 2.2 0.0 - 3.2 ratio  VITAMIN D 25 Hydroxy (Vit-D Deficiency, Fractures)  Result Value Ref Range   Vit D, 25-Hydroxy 48.6 30.0 - 100.0 ng/mL      Assessment & Plan:   Problem List Items Addressed This Visit       Cardiovascular and Mediastinum   Hypertension associated with diabetes ([place])    At upper end of goal today with at home measurements wnl. No changes today. Reviewed recent labs. F/u 6 months.      Relevant Medications   rosuvastatin (CRESTOR) 5 MG tablet     Respiratory   Allergic rhinitis - Primary   Relevant Medications   fluticasone (FLONASE) 50 MCG/ACT nasal spray     Endocrine   Hyperlipidemia associated with type  2 diabetes mellitus ([place])    Recent lipid panel not to goal. Will trial crestor 50m. Recheck FLP in 6 weeks.       Relevant Medications   rosuvastatin (CRESTOR) 5 MG tablet   T2DM (type 2 diabetes mellitus) ([place]    Recent A1c to goal, no changes.       Relevant Medications   rosuvastatin (CRESTOR) 5 MG tablet     Other   Anxiety   Relevant Medications   fluticasone (FLONASE) 50 MCG/ACT nasal spray   ALPRAZolam (XANAX) 0.25 MG tablet   Other Visit Diagnoses     Need for immunization against influenza       Relevant Orders   Flu Vaccine QUAD High Dose(Fluad) (Completed)        Follow up plan: Return in about 6 months (around 09/09/2022) for chronic dz f/u.   LABORATORY TESTING:  - Pap smear: not applicable  IMMUNIZATIONS:   - Tdap: Tetanus vaccination status reviewed: last tetanus booster within 10 years. - Influenza: Administered today - Pneumococcal: Up to date - HPV: Not applicable - Shingrix vaccine: Up to date - COVID vaccine: has received 5 doses of mRNA vaccine  SCREENING: - Mammogram: Up to date  - Colonoscopy: Up to date  - Bone Density: Up to date  -  Lung Cancer Screening: Not applicable   Hep C Screening: due STD testing and prevention (HIV/chl/gon/syphilis): no concerns Sexual History : not sexually active  Menstrual History/LMP/Abnormal Bleeding: post menopausal Incontinence Symptoms: none reported  Osteoporosis: Discussed high calcium and vitamin D supplementation, weight bearing exercises  Advanced Care Planning: A voluntary discussion about advance care planning including the explanation and discussion of advance directives.  Discussed health care proxy and Living will, and the patient was able to identify a health care proxy as cousin, DMercy Moore  Patient does not have a living will at present time. If patient does have living will, I have requested they bring this to the clinic to be scanned in to their chart.  PATIENT COUNSELING:   Advised to take 1 mg of folate supplement per day if capable of pregnancy.   Sexuality: Discussed sexually transmitted diseases, partner selection, use of condoms, avoidance of unintended pregnancy  and contraceptive alternatives.   Advised to avoid cigarette smoking.  I discussed with the patient that most people either abstain from alcohol or drink within safe limits (<=14/week and <=4 drinks/occasion for males, <=7/weeks and <= 3 drinks/occasion for females) and that the risk for alcohol disorders and other health effects rises proportionally with the number of drinks per week and how often a drinker exceeds daily limits.  Discussed cessation/primary prevention of drug use and availability of treatment for abuse.   Diet: Encouraged to adjust caloric intake to maintain  or achieve ideal body weight, to reduce intake of dietary saturated fat and total fat, to limit sodium intake by avoiding high sodium foods and not adding table salt, and to maintain adequate dietary potassium and calcium preferably from fresh fruits, vegetables, and low-fat dairy products.    Stressed the importance of regular  exercise.  Injury prevention: Discussed safety belts, safety helmets, smoke detector, smoking near bedding or upholstery.   Dental health: Discussed importance of regular tooth brushing, flossing, and dental visits.    NEXT PREVENTATIVE PHYSICAL DUE IN 1 YEAR. Return in about 6 months (around 09/09/2022) for chronic dz f/u.

## 2022-03-10 NOTE — Assessment & Plan Note (Signed)
Recent A1c to goal, no changes.

## 2022-03-10 NOTE — Assessment & Plan Note (Signed)
Recent lipid panel not to goal. Will trial crestor 5mg . Recheck FLP in 6 weeks.

## 2022-03-10 NOTE — Patient Instructions (Signed)

## 2022-03-10 NOTE — Assessment & Plan Note (Signed)
At upper end of goal today with at home measurements wnl. No changes today. Reviewed recent labs. F/u 6 months.

## 2022-03-17 ENCOUNTER — Ambulatory Visit (INDEPENDENT_AMBULATORY_CARE_PROVIDER_SITE_OTHER): Payer: Medicare Other | Admitting: Family Medicine

## 2022-03-17 ENCOUNTER — Encounter (INDEPENDENT_AMBULATORY_CARE_PROVIDER_SITE_OTHER): Payer: Self-pay | Admitting: Family Medicine

## 2022-03-17 VITALS — BP 116/71 | HR 83 | Temp 98.0°F | Ht 66.0 in | Wt 191.0 lb

## 2022-03-17 DIAGNOSIS — E1165 Type 2 diabetes mellitus with hyperglycemia: Secondary | ICD-10-CM

## 2022-03-17 DIAGNOSIS — E669 Obesity, unspecified: Secondary | ICD-10-CM | POA: Diagnosis not present

## 2022-03-17 DIAGNOSIS — I10 Essential (primary) hypertension: Secondary | ICD-10-CM

## 2022-03-17 DIAGNOSIS — Z7985 Long-term (current) use of injectable non-insulin antidiabetic drugs: Secondary | ICD-10-CM

## 2022-03-17 DIAGNOSIS — Z6830 Body mass index (BMI) 30.0-30.9, adult: Secondary | ICD-10-CM

## 2022-03-17 MED ORDER — OZEMPIC (0.25 OR 0.5 MG/DOSE) 2 MG/3ML ~~LOC~~ SOPN: 0.5000 mg | PEN_INJECTOR | SUBCUTANEOUS | 0 refills | Status: DC

## 2022-03-18 NOTE — Progress Notes (Signed)
Chief Complaint:   OBESITY Krista Cooper is here to discuss her progress with her obesity treatment plan along with follow-up of her obesity related diagnoses. Krista Cooper is on the Category 3 Plan and states she is following her eating plan approximately 90% of the time. Krista Cooper states she is walking 30-40 minutes 7 times per week.  Today's visit was #: 11 Starting weight: 202 lbs Starting date: 06/24/2021 Today's weight: 191 lbs Today's date: 03/17/2022 Total lbs lost to date: 11 Total lbs lost since last in-office visit: 4  Interim History: Krista Cooper has been following plan at 90% of the time. She was off plan only a few instances and missed a few days at the [place]. Has an eye Dr appointment next week. No plans for Thanksgiving.  Subjective:   1. Type 2 diabetes mellitus with hyperglycemia, without long-term current use of insulin (HCC) Krista Cooper is on Ozempic 0.5 mg weekly. Denies GI side effects. Her last A1c 6.3 on 03/04/22.  2. Essential hypertension Krista Cooper's blood pressure controlled today. Denies chest pain, chest pressure and headache. She is taking Maxzide--25.  Assessment/Plan:   1. Type 2 diabetes mellitus with hyperglycemia, without long-term current use of insulin (HCC) We will refill Ozempic 0.5 mg SubQ weekly for 1 month with 0 refills.  -Refill Semaglutide,0.25 or 0.5MG /DOS, (OZEMPIC, 0.25 OR 0.5 MG/DOSE,) 2 MG/3ML SOPN; Inject 0.5 mg into the skin once a week.  Dispense: 3 mL; Refill: 0  2. Essential hypertension Follow up with blood pressure at next appointment.  3. Obesity, current BMI 30.8 Krista Cooper is currently in the action stage of change. As such, her goal is to continue with weight loss efforts. She has agreed to the Category 3 Plan.   Exercise goals: All adults should avoid inactivity. Some physical activity is better than none, and adults who participate in any amount of physical activity gain some health benefits.  Behavioral modification strategies:  increasing lean protein intake, meal planning and cooking strategies, keeping healthy foods in the home, and planning for success.  Krista Cooper has agreed to follow-up with our clinic in 4 weeks. She was informed of the importance of frequent follow-up visits to maximize her success with intensive lifestyle modifications for her multiple health conditions.   Objective:   Blood pressure 116/71, pulse 83, temperature 98 F (36.7 C), height 5\' 6"  (1.676 m), weight 191 lb (86.6 kg), SpO2 94 %. Body mass index is 30.83 kg/m.  General: Cooperative, alert, well developed, in no acute distress. HEENT: Conjunctivae and lids unremarkable. Cardiovascular: Regular rhythm.  Lungs: Normal work of breathing. Neurologic: No focal deficits.   Lab Results  Component Value Date   CREATININE 0.64 03/04/2022   BUN 21 03/04/2022   NA 137 03/04/2022   K 4.3 03/04/2022   CL 99 03/04/2022   CO2 24 03/04/2022   Lab Results  Component Value Date   ALT 29 03/04/2022   AST 32 03/04/2022   ALKPHOS 77 03/04/2022   BILITOT 0.4 03/04/2022   Lab Results  Component Value Date   HGBA1C 6.3 (H) 03/04/2022   HGBA1C 6.4 (H) 10/14/2021   HGBA1C 6.5 (H) 06/24/2021   HGBA1C 6.3 (H) 02/20/2021   HGBA1C 6.0 (H) 10/13/2019   Lab Results  Component Value Date   INSULIN 9.0 10/14/2021   INSULIN 11.3 06/24/2021   Lab Results  Component Value Date   TSH 2.120 06/24/2021   Lab Results  Component Value Date   CHOL 213 (H) 03/04/2022   HDL 62 03/04/2022  LDLCALC 137 (H) 03/04/2022   TRIG 80 03/04/2022   CHOLHDL 2.9 02/20/2021   Lab Results  Component Value Date   VD25OH 48.6 03/04/2022   VD25OH 58.7 10/14/2021   VD25OH 39.9 06/24/2021   Lab Results  Component Value Date   WBC 4.9 06/24/2021   HGB 13.6 06/24/2021   HCT 41.5 06/24/2021   MCV 88 06/24/2021   PLT 373 06/24/2021   No results found for: "IRON", "TIBC", "FERRITIN"  Obesity Behavioral Intervention:   Approximately 15 minutes were spent  on the discussion below.  ASK: We discussed the diagnosis of obesity with Krista Cooper today and Krista Cooper agreed to give [place] permission to discuss obesity behavioral modification therapy today.  ASSESS: [name] has the diagnosis of obesity and her BMI today is 30.8. Krista Cooper is in the action stage of change.   ADVISE: Krista Cooper was educated on the multiple health risks of obesity as well as the benefit of weight loss to improve her health. She was advised of the need for long term treatment and the importance of lifestyle modifications to improve her current health and to decrease her risk of future health problems.  AGREE: Multiple dietary modification options and treatment options were discussed and Krista Cooper agreed to follow the recommendations documented in the above note.  ARRANGE: Krista Cooper was educated on the importance of frequent visits to treat obesity as outlined per CMS and USPSTF guidelines and agreed to schedule her next follow up appointment today.  Attestation Statements:   Reviewed by clinician on day of visit: allergies, medications, problem list, medical history, surgical history, family history, social history, and previous encounter notes.  I, Krista Cooper, RMA am acting as transcriptionist for Krista Common, MD.  I have reviewed the above documentation for accuracy and completeness, and I agree with the above. - Krista Common, MD

## 2022-03-27 DIAGNOSIS — H2513 Age-related nuclear cataract, bilateral: Secondary | ICD-10-CM | POA: Diagnosis not present

## 2022-03-27 LAB — HM DIABETES EYE EXAM

## 2022-04-14 ENCOUNTER — Ambulatory Visit (INDEPENDENT_AMBULATORY_CARE_PROVIDER_SITE_OTHER): Payer: Medicare Other | Admitting: Family Medicine

## 2022-04-27 ENCOUNTER — Ambulatory Visit (INDEPENDENT_AMBULATORY_CARE_PROVIDER_SITE_OTHER): Payer: Medicare Other | Admitting: Family Medicine

## 2022-04-27 ENCOUNTER — Encounter (INDEPENDENT_AMBULATORY_CARE_PROVIDER_SITE_OTHER): Payer: Self-pay | Admitting: Family Medicine

## 2022-04-27 VITALS — BP 131/79 | HR 93 | Temp 98.7°F | Ht 66.0 in | Wt 191.0 lb

## 2022-04-27 DIAGNOSIS — Z7985 Long-term (current) use of injectable non-insulin antidiabetic drugs: Secondary | ICD-10-CM

## 2022-04-27 DIAGNOSIS — E785 Hyperlipidemia, unspecified: Secondary | ICD-10-CM

## 2022-04-27 DIAGNOSIS — Z6831 Body mass index (BMI) 31.0-31.9, adult: Secondary | ICD-10-CM

## 2022-04-27 DIAGNOSIS — E1165 Type 2 diabetes mellitus with hyperglycemia: Secondary | ICD-10-CM | POA: Diagnosis not present

## 2022-04-27 DIAGNOSIS — E1169 Type 2 diabetes mellitus with other specified complication: Secondary | ICD-10-CM

## 2022-04-27 DIAGNOSIS — E669 Obesity, unspecified: Secondary | ICD-10-CM | POA: Diagnosis not present

## 2022-04-27 MED ORDER — OZEMPIC (0.25 OR 0.5 MG/DOSE) 2 MG/3ML ~~LOC~~ SOPN: 0.5000 mg | PEN_INJECTOR | SUBCUTANEOUS | 0 refills | Status: DC

## 2022-04-29 ENCOUNTER — Telehealth: Payer: Self-pay

## 2022-04-29 DIAGNOSIS — E7849 Other hyperlipidemia: Secondary | ICD-10-CM

## 2022-04-29 NOTE — Telephone Encounter (Signed)
Called patient to let her know labs are ordered, she does not need an appt to have those done.

## 2022-04-29 NOTE — Telephone Encounter (Signed)
Copied from CRM [id number]. Topic: General - Inquiry >> Apr 29, 2022 11:27 AM De Blanch wrote: Reason for CRM: Pt stated that in October, Dr.B changed her cholesterol medication, rosuvastatin (CRESTOR) 5 MG tablet and was advised to call back to schedule labs in 6 weeks.   No orders.  Please advise.

## 2022-05-08 DIAGNOSIS — E7849 Other hyperlipidemia: Secondary | ICD-10-CM | POA: Diagnosis not present

## 2022-05-09 LAB — LIPID PANEL
Chol/HDL Ratio: 3.1 ratio (ref 0.0–4.4)
Cholesterol, Total: 213 mg/dL — ABNORMAL HIGH (ref 100–199)
HDL: 69 mg/dL (ref >39)
LDL Chol Calc (NIH): 130 mg/dL — ABNORMAL HIGH (ref 0–99)
Triglycerides: 78 mg/dL (ref 0–149)
VLDL Cholesterol Cal: 14 mg/dL (ref 5–40)

## 2022-05-15 NOTE — Progress Notes (Signed)
Chief Complaint:   OBESITY Krista Cooper is here to discuss her progress with her obesity treatment plan along with follow-up of her obesity related diagnoses. Krista Cooper is on the Category 3 Plan and states she is following her eating plan approximately 85-90% of the time. Krista Cooper states she is walking and going to the gym 20-40 minutes 1-6 times per week.  Today's visit was #: 12 Starting weight: 202 lbs Starting date: 06/24/2021 Today's weight: 191 lbs Today's date: 04/27/2022 Total lbs lost to date: 11 Total lbs lost since last in-office visit: 0  Interim History: Pt celebrated Thanksgiving at 2 separate friends houses. She went to the beach a week after Thanksgiving. For December, pt is going to [facility] December 15. Christmas Eve and Christmas Day she will be with cousins. She wants to stay consistent with plan over the next month.  Subjective:   1. Type 2 diabetes mellitus with hyperglycemia, without long-term current use of insulin (HCC) Krista Cooper is on Ozempic 0.5 mg weekly. Her A1c was 6.3 on last check.  2. Hyperlipidemia associated with type 2 diabetes mellitus (HCC) Pt is on Crestor 5 mg daily. Her last LDL was not at goal.  Assessment/Plan:   1. Type 2 diabetes mellitus with hyperglycemia, without long-term current use of insulin (HCC) Good blood sugar control is important to decrease the likelihood of diabetic complications such as nephropathy, neuropathy, limb loss, blindness, coronary artery disease, and death. Intensive lifestyle modification including diet, exercise and weight loss are the first line of treatment for diabetes.   Refill- Semaglutide,0.25 or 0.5MG /DOS, (OZEMPIC, 0.25 OR 0.5 MG/DOSE,) 2 MG/3ML SOPN; Inject 0.5 mg into the skin once a week.  Dispense: 3 mL; Refill: 0  2. Hyperlipidemia associated with type 2 diabetes mellitus (HCC) Cardiovascular risk and specific lipid/LDL goals reviewed.  We discussed several lifestyle modifications today and Krista Cooper  will continue to work on diet, exercise and weight loss efforts. Orders and follow up as documented in patient record.  Continue meds and repeat labs in 1-2 months.  Counseling Intensive lifestyle modifications are the first line treatment for this issue. Dietary changes: Increase soluble fiber. Decrease simple carbohydrates. Exercise changes: Moderate to vigorous-intensity aerobic activity 150 minutes per week if tolerated. Lipid-lowering medications: see documented in medical record.  3. Obesity, current BMI 31.0 Krista Cooper is currently in the action stage of change. As such, her goal is to continue with weight loss efforts. She has agreed to the Category 3 Plan.   Exercise goals: All adults should avoid inactivity. Some physical activity is better than none, and adults who participate in any amount of physical activity gain some health benefits.  Behavioral modification strategies: increasing lean protein intake, meal planning and cooking strategies, keeping healthy foods in the home, and planning for success.  Krista Cooper has agreed to follow-up with our clinic in 4 weeks. She was informed of the importance of frequent follow-up visits to maximize her success with intensive lifestyle modifications for her multiple health conditions.   Objective:   Blood pressure 131/79, pulse 93, temperature 98.7 F (37.1 C), height 5\' 6"  (1.676 m), weight 191 lb (86.6 kg), SpO2 98 %. Body mass index is 30.83 kg/m.  General: Cooperative, alert, well developed, in no acute distress. HEENT: Conjunctivae and lids unremarkable. Cardiovascular: Regular rhythm.  Lungs: Normal work of breathing. Neurologic: No focal deficits.   Lab Results  Component Value Date   CREATININE 0.64 03/04/2022   BUN 21 03/04/2022   NA 137 03/04/2022  K 4.3 03/04/2022   CL 99 03/04/2022   CO2 24 03/04/2022   Lab Results  Component Value Date   ALT 29 03/04/2022   AST 32 03/04/2022   ALKPHOS 77 03/04/2022   BILITOT 0.4  03/04/2022   Lab Results  Component Value Date   HGBA1C 6.3 (H) 03/04/2022   HGBA1C 6.4 (H) 10/14/2021   HGBA1C 6.5 (H) 06/24/2021   HGBA1C 6.3 (H) 02/20/2021   HGBA1C 6.0 (H) 10/13/2019   Lab Results  Component Value Date   INSULIN 9.0 10/14/2021   INSULIN 11.3 06/24/2021   Lab Results  Component Value Date   TSH 2.120 06/24/2021   Lab Results  Component Value Date   CHOL 213 (H) 05/08/2022   HDL 69 05/08/2022   LDLCALC 130 (H) 05/08/2022   TRIG 78 05/08/2022   CHOLHDL 3.1 05/08/2022   Lab Results  Component Value Date   VD25OH 48.6 03/04/2022   VD25OH 58.7 10/14/2021   VD25OH 39.9 06/24/2021   Lab Results  Component Value Date   WBC 4.9 06/24/2021   HGB 13.6 06/24/2021   HCT 41.5 06/24/2021   MCV 88 06/24/2021   PLT 373 06/24/2021    Attestation Statements:   Reviewed by clinician on day of visit: allergies, medications, problem list, medical history, surgical history, family history, social history, and previous encounter notes.  I, Kyung Rudd, BS, CMA, am acting as transcriptionist for Reuben Likes, MD.   I have reviewed the above documentation for accuracy and completeness, and I agree with the above. - Reuben Likes, MD

## 2022-05-26 ENCOUNTER — Telehealth: Payer: Self-pay | Admitting: Family Medicine

## 2022-05-26 ENCOUNTER — Encounter (INDEPENDENT_AMBULATORY_CARE_PROVIDER_SITE_OTHER): Payer: Self-pay | Admitting: Family Medicine

## 2022-05-26 ENCOUNTER — Ambulatory Visit (INDEPENDENT_AMBULATORY_CARE_PROVIDER_SITE_OTHER): Payer: Medicare Other | Admitting: Family Medicine

## 2022-05-26 VITALS — BP 134/78 | HR 87 | Temp 98.3°F | Ht 66.0 in | Wt 196.0 lb

## 2022-05-26 DIAGNOSIS — E1169 Type 2 diabetes mellitus with other specified complication: Secondary | ICD-10-CM | POA: Diagnosis not present

## 2022-05-26 DIAGNOSIS — Z7985 Long-term (current) use of injectable non-insulin antidiabetic drugs: Secondary | ICD-10-CM

## 2022-05-26 DIAGNOSIS — E669 Obesity, unspecified: Secondary | ICD-10-CM | POA: Diagnosis not present

## 2022-05-26 DIAGNOSIS — E1165 Type 2 diabetes mellitus with hyperglycemia: Secondary | ICD-10-CM | POA: Diagnosis not present

## 2022-05-26 DIAGNOSIS — E785 Hyperlipidemia, unspecified: Secondary | ICD-10-CM

## 2022-05-26 DIAGNOSIS — Z6831 Body mass index (BMI) 31.0-31.9, adult: Secondary | ICD-10-CM

## 2022-05-26 NOTE — Telephone Encounter (Signed)
Pt called about the advice from [facility] technician to cut back on dairy and meats due to her last cholesterol labs and to follow up with Dr. Jearld Shines pt saw Dr. Jearld Shines today and she suggested upping the pts rosuvastatin (CRESTOR) 5 MG tablet to 10MG  tabs / please advise   The patients bad cholesterol is 211

## 2022-05-28 MED ORDER — ROSUVASTATIN CALCIUM 10 MG PO TABS: 10.0000 mg | ORAL_TABLET | Freq: Every day | ORAL | 3 refills | Status: DC

## 2022-05-28 NOTE — Telephone Encounter (Signed)
Her bad cholesterol is 130.  That's her total cholesterol.  I see the result note from Dr Quentin Cornwall. I would also recommend increasing Crestor to 10 mg daily. If patient agrees, please send new Rx #[id number]. Recheck in 3-6 months at next appt.

## 2022-05-28 NOTE — Telephone Encounter (Signed)
Left message advising increase of rosuvastatin 10 mg sent to pharmacy.

## 2022-06-02 NOTE — Progress Notes (Signed)
Chief Complaint:   OBESITY Krista Cooper is here to discuss her progress with her obesity treatment plan along with follow-up of her obesity related diagnoses. Krista Cooper is on the Category 3 Plan and states she is following her eating plan approximately 85% of the time. Krista Cooper states she is walking 30-45 minutes 7 times per week.  Today's visit was #: 77 Starting weight: 202 lbs Starting date: 06/24/2021 Today's weight: 196 lbs Today's date: 05/26/2022 Total lbs lost to date: 6 lbs Total lbs lost since last in-office visit: 0  Interim History: Krista Cooper had a indulgent Christmas, eating some food she does not normally eat.  She unfortunately did not get in as much exercise as she is morning.  She was kept busy over the holidays.  She has stopped working at [employer].  Looking for alternative to [facility].  Subjective:   1. Type 2 diabetes mellitus with hyperglycemia, without long-term current use of insulin (Krista Cooper) Krista Cooper on Ozempic with no GI side effects.  Last A1c slightly improved.  2. Hyperlipidemia associated with type 2 diabetes mellitus (HCC) Last LDL of 130, HDL of 69, trig of 78.  On Crestor 5 mg.  Assessment/Plan:   1. Type 2 diabetes mellitus with hyperglycemia, without long-term current use of insulin (HCC) Continue Ozempic without changes in dose.  2. Hyperlipidemia associated with type 2 diabetes mellitus ([place]) Patient encouraged to reach out to Dr. B to increase Crestor.  3. Obesity, current BMI 31.6 Krista Cooper is currently in the action stage of change. As such, her goal is to continue with weight loss efforts. She has agreed to the Category 3 Plan.   Exercise goals: Older adults should follow the adult guidelines. When older adults cannot meet the adult guidelines, they should be as physically active as their abilities and conditions will allow.   Behavioral modification strategies: increasing lean protein intake, meal planning and cooking strategies, keeping  healthy foods in the home, and planning for success.  Krista Cooper has agreed to follow-up with our clinic in 4 weeks. She was informed of the importance of frequent follow-up visits to maximize her success with intensive lifestyle modifications for her multiple health conditions.   Objective:   Blood pressure 134/78, pulse 87, temperature 98.3 F (36.8 C), height 5\' 6"  (1.676 m), weight 196 lb (88.9 kg), SpO2 97 %. Body mass index is 31.64 kg/m.  General: Cooperative, alert, well developed, in no acute distress. HEENT: Conjunctivae and lids unremarkable. Cardiovascular: Regular rhythm.  Lungs: Normal work of breathing. Neurologic: No focal deficits.   Lab Results  Component Value Date   CREATININE 0.64 03/04/2022   BUN 21 03/04/2022   NA 137 03/04/2022   K 4.3 03/04/2022   CL 99 03/04/2022   CO2 24 03/04/2022   Lab Results  Component Value Date   ALT 29 03/04/2022   AST 32 03/04/2022   ALKPHOS 77 03/04/2022   BILITOT 0.4 03/04/2022   Lab Results  Component Value Date   HGBA1C 6.3 (H) 03/04/2022   HGBA1C 6.4 (H) 10/14/2021   HGBA1C 6.5 (H) 06/24/2021   HGBA1C 6.3 (H) 02/20/2021   HGBA1C 6.0 (H) 10/13/2019   Lab Results  Component Value Date   INSULIN 9.0 10/14/2021   INSULIN 11.3 06/24/2021   Lab Results  Component Value Date   TSH 2.120 06/24/2021   Lab Results  Component Value Date   CHOL 213 (H) 05/08/2022   HDL 69 05/08/2022   LDLCALC 130 (H) 05/08/2022   TRIG 78  05/08/2022   CHOLHDL 3.1 05/08/2022   Lab Results  Component Value Date   VD25OH 48.6 03/04/2022   VD25OH 58.7 10/14/2021   VD25OH 39.9 06/24/2021   Lab Results  Component Value Date   WBC 4.9 06/24/2021   HGB 13.6 06/24/2021   HCT 41.5 06/24/2021   MCV 88 06/24/2021   PLT 373 06/24/2021   No results found for: "IRON", "TIBC", "FERRITIN"  Attestation Statements:   Reviewed by clinician on day of visit: allergies, medications, problem list, medical history, surgical history, family  history, social history, and previous encounter notes.  I, Elnora Morrison, RMA am acting as transcriptionist for Coralie Common, MD.  I have reviewed the above documentation for accuracy and completeness, and I agree with the above. - Coralie Common, MD

## 2022-06-17 ENCOUNTER — Encounter (INDEPENDENT_AMBULATORY_CARE_PROVIDER_SITE_OTHER): Payer: Self-pay | Admitting: Family Medicine

## 2022-06-17 ENCOUNTER — Ambulatory Visit (INDEPENDENT_AMBULATORY_CARE_PROVIDER_SITE_OTHER): Payer: Medicare Other | Admitting: Family Medicine

## 2022-06-17 VITALS — BP 134/70 | HR 102 | Temp 98.4°F | Ht 66.0 in | Wt 196.0 lb

## 2022-06-17 DIAGNOSIS — E1159 Type 2 diabetes mellitus with other circulatory complications: Secondary | ICD-10-CM | POA: Diagnosis not present

## 2022-06-17 DIAGNOSIS — E66811 Body mass index (BMI) 32.0-32.9, adult: Secondary | ICD-10-CM

## 2022-06-17 DIAGNOSIS — E669 Obesity, unspecified: Secondary | ICD-10-CM

## 2022-06-17 DIAGNOSIS — Z6831 Body mass index (BMI) 31.0-31.9, adult: Secondary | ICD-10-CM

## 2022-06-17 DIAGNOSIS — E1165 Type 2 diabetes mellitus with hyperglycemia: Secondary | ICD-10-CM

## 2022-06-17 DIAGNOSIS — I152 Hypertension secondary to endocrine disorders: Secondary | ICD-10-CM | POA: Diagnosis not present

## 2022-06-17 DIAGNOSIS — Z7985 Long-term (current) use of injectable non-insulin antidiabetic drugs: Secondary | ICD-10-CM

## 2022-06-17 MED ORDER — OZEMPIC (0.25 OR 0.5 MG/DOSE) 2 MG/3ML ~~LOC~~ SOPN: 0.5000 mg | PEN_INJECTOR | SUBCUTANEOUS | 0 refills | Status: DC

## 2022-06-22 ENCOUNTER — Encounter (INDEPENDENT_AMBULATORY_CARE_PROVIDER_SITE_OTHER): Payer: Self-pay

## 2022-06-30 NOTE — Progress Notes (Signed)
Chief Complaint:   OBESITY Krista Cooper is here to discuss her progress with her obesity treatment plan along with follow-up of her obesity related diagnoses. Krista Cooper is on the Category 3 Plan and states she is following her eating plan approximately 88% of the time. Krista Cooper states she is walking or gym/weights 30 minutes 7/2 times per week.  Today's visit was #: 14 Starting weight: 202 lbs Starting date: 06/24/2021 Today's weight: 196 lbs Today's date: 06/17/2022 Total lbs lost to date: 6 lbs Total lbs lost since last in-office visit: 0  Interim History: Kendl mentions she ate some more chocolate than previously over the last few weeks.  Most of the time she is following Cat 3.  Did a bare workout yesterday and enjoyed it.  No upcoming plans except friends meet up.  She is occasionally incorporating rice as a substitute for bread.  Subjective:   1. Type 2 diabetes mellitus with hyperglycemia, without long-term current use of insulin (HCC) Krista Cooper is on Ozempic.  Last A1c 6.3.  2. Hypertension associated with diabetes ([place]) Blood pressure well controlled today.  Denies chest pain, chest pressure and headache.  Assessment/Plan:   1. Type 2 diabetes mellitus with hyperglycemia, without long-term current use of insulin (HCC) Will refill Ozempic 0.5 mg SubQ once a week for 1 month with 0 refills.  -Refill Semaglutide,0.25 or 0.'5MG'$ /DOS, (OZEMPIC, 0.25 OR 0.5 MG/DOSE,) 2 MG/3ML SOPN; Inject 0.5 mg into the skin once a week.  Dispense: 3 mL; Refill: 0  2. Hypertension associated with diabetes ([place]) Continue current medications without change in medication or dose.  3. Obesity, current BMI 31.7 Krista Cooper is currently in the action stage of change. As such, her goal is to continue with weight loss efforts. She has agreed to the Category 3 Plan and keeping a food journal and adhering to recommended goals of 450-600 calories and 40+ grams of protein supper.   Exercise goals: As  is.  Behavioral modification strategies: increasing lean protein intake, meal planning and cooking strategies, keeping healthy foods in the home, and planning for success.  Krista Cooper has agreed to follow-up with our clinic in 2 weeks. She was informed of the importance of frequent follow-up visits to maximize her success with intensive lifestyle modifications for her multiple health conditions.   Objective:   Blood pressure 134/70, pulse (!) 102, temperature 98.4 F (36.9 C), height '5\' 6"'$  (1.676 m), weight 196 lb (88.9 kg), SpO2 98 %. Body mass index is 31.64 kg/m.  General: Cooperative, alert, well developed, in no acute distress. HEENT: Conjunctivae and lids unremarkable. Cardiovascular: Regular rhythm.  Lungs: Normal work of breathing. Neurologic: No focal deficits.   Lab Results  Component Value Date   CREATININE 0.64 03/04/2022   BUN 21 03/04/2022   NA 137 03/04/2022   K 4.3 03/04/2022   CL 99 03/04/2022   CO2 24 03/04/2022   Lab Results  Component Value Date   ALT 29 03/04/2022   AST 32 03/04/2022   ALKPHOS 77 03/04/2022   BILITOT 0.4 03/04/2022   Lab Results  Component Value Date   HGBA1C 6.3 (H) 03/04/2022   HGBA1C 6.4 (H) 10/14/2021   HGBA1C 6.5 (H) 06/24/2021   HGBA1C 6.3 (H) 02/20/2021   HGBA1C 6.0 (H) 10/13/2019   Lab Results  Component Value Date   INSULIN 9.0 10/14/2021   INSULIN 11.3 06/24/2021   Lab Results  Component Value Date   TSH 2.120 06/24/2021   Lab Results  Component Value Date  CHOL 213 (H) 05/08/2022   HDL 69 05/08/2022   LDLCALC 130 (H) 05/08/2022   TRIG 78 05/08/2022   CHOLHDL 3.1 05/08/2022   Lab Results  Component Value Date   VD25OH 48.6 03/04/2022   VD25OH 58.7 10/14/2021   VD25OH 39.9 06/24/2021   Lab Results  Component Value Date   WBC 4.9 06/24/2021   HGB 13.6 06/24/2021   HCT 41.5 06/24/2021   MCV 88 06/24/2021   PLT 373 06/24/2021   No results found for: "IRON", "TIBC", "FERRITIN"  Attestation Statements:    Reviewed by clinician on day of visit: allergies, medications, problem list, medical history, surgical history, family history, social history, and previous encounter notes.  I, Elnora Morrison, RMA am acting as transcriptionist for Coralie Common, MD.  I have reviewed the above documentation for accuracy and completeness, and I agree with the above. - Coralie Common, MD

## 2022-07-13 ENCOUNTER — Ambulatory Visit (INDEPENDENT_AMBULATORY_CARE_PROVIDER_SITE_OTHER): Payer: Medicare Other | Admitting: Family Medicine

## 2022-07-13 ENCOUNTER — Encounter (INDEPENDENT_AMBULATORY_CARE_PROVIDER_SITE_OTHER): Payer: Self-pay | Admitting: Family Medicine

## 2022-07-13 VITALS — BP 156/72 | HR 89 | Temp 98.4°F | Ht 66.0 in | Wt 198.0 lb

## 2022-07-13 DIAGNOSIS — Z6831 Body mass index (BMI) 31.0-31.9, adult: Secondary | ICD-10-CM

## 2022-07-13 DIAGNOSIS — Z6832 Body mass index (BMI) 32.0-32.9, adult: Secondary | ICD-10-CM

## 2022-07-13 DIAGNOSIS — E1169 Type 2 diabetes mellitus with other specified complication: Secondary | ICD-10-CM | POA: Diagnosis not present

## 2022-07-13 DIAGNOSIS — E785 Hyperlipidemia, unspecified: Secondary | ICD-10-CM

## 2022-07-13 DIAGNOSIS — E669 Obesity, unspecified: Secondary | ICD-10-CM | POA: Diagnosis not present

## 2022-07-13 DIAGNOSIS — E1165 Type 2 diabetes mellitus with hyperglycemia: Secondary | ICD-10-CM | POA: Diagnosis not present

## 2022-07-13 DIAGNOSIS — Z7985 Long-term (current) use of injectable non-insulin antidiabetic drugs: Secondary | ICD-10-CM

## 2022-07-13 MED ORDER — OZEMPIC (0.25 OR 0.5 MG/DOSE) 2 MG/3ML ~~LOC~~ SOPN: 0.5000 mg | PEN_INJECTOR | SUBCUTANEOUS | 0 refills | Status: DC

## 2022-07-13 NOTE — Progress Notes (Deleted)
Patient has had a good few weeks.  She has been doing her walking and exercising consistently.  She has had a walk in shower installed in her house. She has also gotten a new mattress.  She is going [facility] at the [facility] center [facility].  She did do a barre video as well. Next few weeks she is working the [employer] in [place].  Likes the food on the plan for a majority of time.

## 2022-07-27 NOTE — Progress Notes (Unsigned)
Chief Complaint:   OBESITY Krista Cooper is here to discuss her progress with her obesity treatment plan along with follow-up of her obesity related diagnoses. Krista Cooper is on the Category 3 Plan and keeping a food journal and adhering to recommended goals of 450-600 calories and 40 protein and states she is following her eating plan approximately 88-90% of the time. Krista Cooper states she is walking, exercise class, weights 30-40 minutes 1-7 times per week.  Today's visit was #: 82 Starting weight: 202 LBS Starting date: 07/22/2021 Today's weight: 198 LBS Today's date: 07/13/2022 Total lbs lost to date: 4 LBS Total lbs lost since last in-office visit: +2 LBS  Interim History:  Patient has had a good few weeks.  She has been doing her walking and exercising consistently.  She has had a walk in shower installed in her house. She has also gotten a new mattress.  She is going to an exercise class at the senior center once a week.  She did do a barre video as well. Next few weeks she is working the [employer] in [place].  Likes the food on the plan for a majority of time.   Subjective:   1. Type 2 diabetes mellitus with hyperglycemia, without long-term current use of insulin (HCC) Patient is on Ozempic 0.5 mg weekly.  Patient denies GI side effects.  2. Hyperlipidemia associated with type 2 diabetes mellitus ([place]) Patient is on Crestor with no side effects noted.  Assessment/Plan:   1. Type 2 diabetes mellitus with hyperglycemia, without long-term current use of insulin (HCC) Refill- Semaglutide,0.25 or 0.'5MG'$ /DOS, (OZEMPIC, 0.25 OR 0.5 MG/DOSE,) 2 MG/3ML SOPN; Inject 0.5 mg into the skin once a week.  Dispense: 3 mL; Refill: 0  2. Hyperlipidemia associated with type 2 diabetes mellitus (HCC) Continue Crestor no change in medication or dose.  3. Obesity with current BMI of 32.0 Krista Cooper is currently in the action stage of change. As such, her goal is to continue with weight loss efforts.  She has agreed to the Category 3 Plan.   Exercise goals:  As is.  Behavioral modification strategies: increasing lean protein intake, meal planning and cooking strategies, keeping healthy foods in the home, and planning for success.  Krista Cooper has agreed to follow-up with our clinic in 3 weeks. She was informed of the importance of frequent follow-up visits to maximize her success with intensive lifestyle modifications for her multiple health conditions.   Objective:   Blood pressure (!) 156/72, pulse 89, temperature 98.4 F (36.9 C), height '5\' 6"'$  (1.676 m), weight 198 lb (89.8 kg), SpO2 99 %. Body mass index is 31.96 kg/m.  General: Cooperative, alert, well developed, in no acute distress. HEENT: Conjunctivae and lids unremarkable. Cardiovascular: Regular rhythm.  Lungs: Normal work of breathing. Neurologic: No focal deficits.   Lab Results  Component Value Date   CREATININE 0.64 03/04/2022   BUN 21 03/04/2022   NA 137 03/04/2022   K 4.3 03/04/2022   CL 99 03/04/2022   CO2 24 03/04/2022   Lab Results  Component Value Date   ALT 29 03/04/2022   AST 32 03/04/2022   ALKPHOS 77 03/04/2022   BILITOT 0.4 03/04/2022   Lab Results  Component Value Date   HGBA1C 6.3 (H) 03/04/2022   HGBA1C 6.4 (H) 10/14/2021   HGBA1C 6.5 (H) 06/24/2021   HGBA1C 6.3 (H) 02/20/2021   HGBA1C 6.0 (H) 10/13/2019   Lab Results  Component Value Date   INSULIN 9.0 10/14/2021   INSULIN  11.3 06/24/2021   Lab Results  Component Value Date   TSH 2.120 06/24/2021   Lab Results  Component Value Date   CHOL 213 (H) 05/08/2022   HDL 69 05/08/2022   LDLCALC 130 (H) 05/08/2022   TRIG 78 05/08/2022   CHOLHDL 3.1 05/08/2022   Lab Results  Component Value Date   VD25OH 48.6 03/04/2022   VD25OH 58.7 10/14/2021   VD25OH 39.9 06/24/2021   Lab Results  Component Value Date   WBC 4.9 06/24/2021   HGB 13.6 06/24/2021   HCT 41.5 06/24/2021   MCV 88 06/24/2021   PLT 373 06/24/2021   No results  found for: "IRON", "TIBC", "FERRITIN"  Attestation Statements:   Reviewed by clinician on day of visit: allergies, medications, problem list, medical history, surgical history, family history, social history, and previous encounter notes.  I, Davy Pique, RMA, am acting as transcriptionist for Coralie Common, MD. I have reviewed the above documentation for accuracy and completeness, and I agree with the above. - Coralie Common, MD

## 2022-07-30 ENCOUNTER — Ambulatory Visit (INDEPENDENT_AMBULATORY_CARE_PROVIDER_SITE_OTHER): Payer: Medicare Other | Admitting: Physician Assistant

## 2022-07-30 ENCOUNTER — Encounter: Payer: Self-pay | Admitting: Physician Assistant

## 2022-07-30 VITALS — BP 138/72 | HR 99 | Temp 98.1°F | Wt 198.0 lb

## 2022-07-30 DIAGNOSIS — J0110 Acute frontal sinusitis, unspecified: Secondary | ICD-10-CM | POA: Diagnosis not present

## 2022-07-30 DIAGNOSIS — J069 Acute upper respiratory infection, unspecified: Secondary | ICD-10-CM

## 2022-07-30 LAB — POC COVID19 BINAXNOW: SARS Coronavirus 2 Ag: NEGATIVE

## 2022-07-30 MED ORDER — BENZONATATE 100 MG PO CAPS: 100.0000 mg | ORAL_CAPSULE | Freq: Two times a day (BID) | ORAL | 0 refills | Status: DC | PRN

## 2022-07-30 MED ORDER — AMOXICILLIN 875 MG PO TABS: 875.0000 mg | ORAL_TABLET | Freq: Two times a day (BID) | ORAL | 0 refills | Status: AC

## 2022-07-30 NOTE — Progress Notes (Signed)
I,[name],acting as a Education administrator for [employer], PA-C.,have documented all relevant documentation on the behalf of Krista Kirschner, PA-C,as directed by  Krista Kirschner, PA-C while in the presence of Krista Kirschner, PA-C.   Established patient visit   Patient: Krista Cooper   DOB: 1956-04-09   67 y.o. Female  MRN: [id number] Visit Date: 07/30/2022  Today's healthcare provider: Mikey Kirschner, PA-C   Cc. Sore throat, congestion, rhinorrhea x 1 week  Subjective    HPI   Pt reports a sore throat starting x 1 week ago, and x 4 days ago she developed congestion, cough, rhinorrhea. She is taking mucinex and nyquil at home. Denies fever, sob, wheezing. Denies sick contacts.  Medications: Outpatient Medications Prior to Visit  Medication Sig   ALPRAZolam (XANAX) 0.25 MG tablet Take 1 tablet (0.25 mg total) by mouth 2 (two) times daily as needed for anxiety.   Cholecalciferol (VITAMIN D3) 125 MCG (5000 UT) CAPS Take 1 capsule (5,000 Units total) by mouth daily.   Coenzyme Q10 (COQ10) 200 MG CAPS Take 200 mg by mouth daily with breakfast.   fluticasone (FLONASE) 50 MCG/ACT nasal spray Place 2 sprays into both nostrils daily.   Insulin Pen Needle (BD PEN NEEDLE NANO 2ND GEN) 32G X 4 MM MISC 1 Package by Does not apply route 2 (two) times daily.   levocetirizine (XYZAL) 5 MG tablet Take 1 tablet (5 mg total) by mouth every evening.   MAGNESIUM GLYCINATE PO Take 240 mg by mouth.   MULTIPLE VITAMIN PO Take 1 tablet by mouth daily.   Omega-3 Fatty Acids (FISH OIL) 1200 MG CAPS Take 2 capsules (2,400 mg total) by mouth daily.   rosuvastatin (CRESTOR) 10 MG tablet Take 1 tablet (10 mg total) by mouth daily.   Semaglutide,0.25 or 0.'5MG'$ /DOS, (OZEMPIC, 0.25 OR 0.5 MG/DOSE,) 2 MG/3ML SOPN Inject 0.5 mg into the skin once a week.   sertraline (ZOLOFT) 50 MG tablet Take 1 tablet (50 mg total) by mouth daily.   triamterene-hydrochlorothiazide (MAXZIDE-25) 37.5-25 MG tablet Take 1 tablet by  mouth daily.   B Complex Vitamins (VITAMIN B COMPLEX PO) Take 1 tablet by mouth daily.   No facility-administered medications prior to visit.    Review of Systems  Constitutional:  Positive for fatigue. Negative for fever.  HENT:  Positive for congestion and rhinorrhea.   Respiratory:  Positive for cough. Negative for shortness of breath.   Cardiovascular:  Negative for chest pain and leg swelling.  Gastrointestinal:  Negative for abdominal pain.  Neurological:  Negative for dizziness and headaches.      Objective    BP 138/72 (BP Location: Left Arm, Patient Position: Sitting, Cuff Size: Large)   Pulse 99   Temp 98.1 F (36.7 C) (Oral)   Wt 198 lb (89.8 kg)   SpO2 100%   BMI 31.96 kg/m    Physical Exam Constitutional:      General: She is awake.     Appearance: She is well-developed.  HENT:     Head: Normocephalic.     Right Ear: Tympanic membrane normal.     Left Ear: Tympanic membrane normal.     Nose: Congestion present.     Mouth/Throat:     Pharynx: Posterior oropharyngeal erythema present. No oropharyngeal exudate.  Eyes:     Conjunctiva/sclera: Conjunctivae normal.  Cardiovascular:     Rate and Rhythm: Normal rate and regular rhythm.     Heart sounds: Normal heart sounds.  Pulmonary:  Effort: Pulmonary effort is normal.     Breath sounds: Normal breath sounds. No wheezing, rhonchi or rales.  Skin:    General: Skin is warm.  Neurological:     Mental Status: She is alert and oriented to person, place, and time.  Psychiatric:        Attention and Perception: Attention normal.        Mood and Affect: Mood normal.        Speech: Speech normal.        Behavior: Behavior is cooperative.     Results for orders placed or performed in visit on 07/30/22  POC COVID-19  Result Value Ref Range   SARS Coronavirus 2 Ag Negative Negative    Assessment & Plan     Acute sinusitis Poc covid negative Advised if no improvement in the next 1-2 days to take  abx Rx amoxicillin 875 mg bid x 7 days Rx tessalon  Increase fluids, mucinex, antihistamine ,flonase, saline rinses   Return if symptoms worsen or fail to improve.      I, Krista Kirschner, PA-C have reviewed all documentation for this visit. The documentation on  07/30/22 for the exam, diagnosis, procedures, and orders are all accurate and complete.  Krista Kirschner, PA-C [facility] [address] [place], [place], [address] Office: [phone] Fax: [place]

## 2022-08-03 ENCOUNTER — Ambulatory Visit (INDEPENDENT_AMBULATORY_CARE_PROVIDER_SITE_OTHER): Payer: Federal, State, Local not specified - PPO | Admitting: Family Medicine

## 2022-08-10 ENCOUNTER — Ambulatory Visit (INDEPENDENT_AMBULATORY_CARE_PROVIDER_SITE_OTHER): Payer: Medicare Other | Admitting: Family Medicine

## 2022-08-10 ENCOUNTER — Encounter (INDEPENDENT_AMBULATORY_CARE_PROVIDER_SITE_OTHER): Payer: Self-pay | Admitting: Family Medicine

## 2022-08-10 VITALS — BP 129/70 | HR 94 | Temp 98.0°F | Ht 66.0 in | Wt 195.0 lb

## 2022-08-10 DIAGNOSIS — Z6831 Body mass index (BMI) 31.0-31.9, adult: Secondary | ICD-10-CM

## 2022-08-10 DIAGNOSIS — E669 Obesity, unspecified: Secondary | ICD-10-CM | POA: Diagnosis not present

## 2022-08-10 DIAGNOSIS — E1159 Type 2 diabetes mellitus with other circulatory complications: Secondary | ICD-10-CM

## 2022-08-10 DIAGNOSIS — I152 Hypertension secondary to endocrine disorders: Secondary | ICD-10-CM

## 2022-08-10 DIAGNOSIS — Z7985 Long-term (current) use of injectable non-insulin antidiabetic drugs: Secondary | ICD-10-CM

## 2022-08-10 NOTE — Progress Notes (Signed)
Chief Complaint:   OBESITY Emrie is here to discuss her progress with her obesity treatment plan along with follow-up of her obesity related diagnoses. Alaijah is on the Category 3 Plan and states she is following her eating plan approximately 85-90% of the time. Normandie states she is doing an exercise class, walking the dogs and yoga 30-60 minutes 1-7 times per week.  Today's visit was #: 79 Starting weight: 202 lbs Starting date: 06/24/2021 Today's weight: 195 lbs Today's date: 08/10/2022 Total lbs lost to date: 7 lbs Total lbs lost since last in-office visit: 3 lbs  Interim History: Patient presents after 4 weeks.  She has had a sinus infection since last appointment.  She started 0.5mg  of Ozempic and had some GI upset the day of of the first dose of the higher dose.  She decreased her exercise the week she was sick due to feeling poorly.  She started exercising again last week and finished the cough medicine she got from her PCP.  Wants to keep with plan for the next few weeks.  Biggest obstacle for the next few weeks is going to be [employer] she was invited to.  She is planning a trip to the beach during the summer but doesn't anticipate that being an obstacle.  Wants to continue with her current exercise and possibly increase the amount of frequency of her plan.   Subjective:   1. Hypertension associated with diabetes ([place]) Patient blood pressure well-controlled today.  Patient denies chest pain, chest pressure, headache.  Patient is taking Maxide 25 mg.  Assessment/Plan:   1. Hypertension associated with diabetes ([place]) Continue current medications no change in dose of prescription.  2. Obesity with current BMI of 31.5 [place] is currently in the action stage of change. As such, her goal is to continue with weight loss efforts. She has agreed to the Category 3 Plan.   Exercise goals: As is.  Behavioral modification strategies: increasing lean protein intake, meal  planning and cooking strategies, keeping healthy foods in the home, and planning for success.  Emiley has agreed to follow-up with our clinic in 4 weeks. She was informed of the importance of frequent follow-up visits to maximize her success with intensive lifestyle modifications for her multiple health conditions.   Objective:   Blood pressure 129/70, pulse 94, temperature 98 F (36.7 C), height 5\' 6"  (1.676 m), weight 195 lb (88.5 kg), SpO2 98 %. Body mass index is 31.47 kg/m.  General: Cooperative, alert, well developed, in no acute distress. HEENT: Conjunctivae and lids unremarkable. Cardiovascular: Regular rhythm.  Lungs: Normal work of breathing. Neurologic: No focal deficits.   Lab Results  Component Value Date   CREATININE 0.64 03/04/2022   BUN 21 03/04/2022   NA 137 03/04/2022   K 4.3 03/04/2022   CL 99 03/04/2022   CO2 24 03/04/2022   Lab Results  Component Value Date   ALT 29 03/04/2022   AST 32 03/04/2022   ALKPHOS 77 03/04/2022   BILITOT 0.4 03/04/2022   Lab Results  Component Value Date   HGBA1C 6.3 (H) 03/04/2022   HGBA1C 6.4 (H) 10/14/2021   HGBA1C 6.5 (H) 06/24/2021   HGBA1C 6.3 (H) 02/20/2021   HGBA1C 6.0 (H) 10/13/2019   Lab Results  Component Value Date   INSULIN 9.0 10/14/2021   INSULIN 11.3 06/24/2021   Lab Results  Component Value Date   TSH 2.120 06/24/2021   Lab Results  Component Value Date   CHOL 213 (  H) 05/08/2022   HDL 69 05/08/2022   LDLCALC 130 (H) 05/08/2022   TRIG 78 05/08/2022   CHOLHDL 3.1 05/08/2022   Lab Results  Component Value Date   VD25OH 48.6 03/04/2022   VD25OH 58.7 10/14/2021   VD25OH 39.9 06/24/2021   Lab Results  Component Value Date   WBC 4.9 06/24/2021   HGB 13.6 06/24/2021   HCT 41.5 06/24/2021   MCV 88 06/24/2021   PLT 373 06/24/2021   No results found for: "IRON", "TIBC", "FERRITIN"  Attestation Statements:   Reviewed by clinician on day of visit: allergies, medications, problem list,  medical history, surgical history, family history, social history, and previous encounter notes.  I, Davy Pique, RMA, am acting as transcriptionist for Coralie Common, MD. I have reviewed the above documentation for accuracy and completeness, and I agree with the above. - Coralie Common, MD

## 2022-08-22 ENCOUNTER — Other Ambulatory Visit: Payer: Self-pay | Admitting: Family Medicine

## 2022-08-24 NOTE — Telephone Encounter (Signed)
Requested Prescriptions  Pending Prescriptions Disp Refills   sertraline (ZOLOFT) 50 MG tablet [Pharmacy Med Name: Sertraline HCl 50 MG Oral Tablet] 90 tablet 1    Sig: Take 1 tablet by mouth once daily     Psychiatry:  Antidepressants - SSRI - sertraline Passed - 08/22/2022  6:40 AM      Passed - AST in normal range and within 360 days    AST  Date Value Ref Range Status  03/04/2022 32 0 - 40 IU/L Final         Passed - ALT in normal range and within 360 days    ALT  Date Value Ref Range Status  03/04/2022 29 0 - 32 IU/L Final         Passed - Completed PHQ-2 or PHQ-9 in the last 360 days      Passed - Valid encounter within last 6 months    Recent Outpatient Visits           3 weeks ago Acute non-recurrent frontal sinusitis   [facility] [place], [place], PA-C   5 months ago Seasonal allergic rhinitis, unspecified trigger   [facility] [place] M, DO   12 months ago Hypertension associated with diabetes [facility])   [place] [facility] [place], [name], MD   1 year ago [facility] preventive visit   [facility] [place], [name], MD   1 year ago Anxiety   [facility] [place], [name], MD       Future Appointments             In 1 week [place], [name], MD [place] [facility]

## 2022-08-29 ENCOUNTER — Other Ambulatory Visit: Payer: Self-pay | Admitting: Family Medicine

## 2022-08-29 DIAGNOSIS — F419 Anxiety disorder, unspecified: Secondary | ICD-10-CM

## 2022-08-29 DIAGNOSIS — J302 Other seasonal allergic rhinitis: Secondary | ICD-10-CM

## 2022-08-31 NOTE — Telephone Encounter (Signed)
Requested Prescriptions  Pending Prescriptions Disp Refills   fluticasone (FLONASE) 50 MCG/ACT nasal spray [Pharmacy Med Name: Fluticasone Propionate 50 MCG/ACT Nasal Suspension] 16 g 0    Sig: Use 2 spray(s) in each nostril once daily     Ear, Nose, and Throat: Nasal Preparations - Corticosteroids Passed - 08/29/2022  9:23 AM      Passed - Valid encounter within last 12 months    Recent Outpatient Visits           1 month ago Acute non-recurrent frontal sinusitis   [place] [facility] Alfredia Ferguson, PA-C   5 months ago Seasonal allergic rhinitis, unspecified trigger   [place] [facility] Caro Laroche, DO   1 year ago Hypertension associated with diabetes [facility])   [place] [facility], [name], MD   1 year ago Welcome to [facility] preventive visit   [facility], [name], MD   1 year ago Anxiety   [place] [facility] [place], [name], MD       Future Appointments             Tomorrow Bac[place]upo, [name], MD [facility]

## 2022-08-31 NOTE — Progress Notes (Unsigned)
I,Latonda Larrivee S Khyree Carillo,acting as a scribe for Shirlee Latch, MD.,have documented all relevant documentation on the behalf of Shirlee Latch, MD,as directed by  Shirlee Latch, MD while in the presence of Shirlee Latch, MD.     Established patient visit   Patient: Krista Cooper   DOB: 1955-08-09   67 y.o. Female  MRN: [id number] Visit Date: 09/01/2022  Today's healthcare provider: Shirlee Latch, MD   No chief complaint on file.  Subjective    HPI  Diabetes Mellitus Type II, follow-up  Lab Results  Component Value Date   HGBA1C 6.3 (H) 03/04/2022   HGBA1C 6.4 (H) 10/14/2021   HGBA1C 6.5 (H) 06/24/2021   Last seen for diabetes 6 months ago.  Management since then includes continuing the same treatment. She reports {excellent/good/fair/poor:19665} compliance with treatment. She {is/is not:21021397} having side effects. {document side effects if present:1}  Home blood sugar records: {diabetes glucometry results:16657}  Episodes of hypoglycemia? {Yes/No:20286} {enter details if yes:1}   Current insulin regiment: {***Type 'None' if not taking insulin                                                otherwise enter complete                                                 details of insulin regiment:1} Most Recent Eye Exam: ***  --------------------------------------------------------------------------------------------------- Hypertension, follow-up  BP Readings from Last 3 Encounters:  08/10/22 129/70  07/30/22 138/72  07/13/22 (!) 156/72   Wt Readings from Last 3 Encounters:  08/10/22 195 lb (88.5 kg)  07/30/22 198 lb (89.8 kg)  07/13/22 198 lb (89.8 kg)     She was last seen for hypertension 6 months ago.  BP at that visit was 136/74. Management since that visit includes no changes. She reports {excellent/good/fair/poor:19665} compliance with treatment. She {is/is not:9024} having side effects. {document side effects if present:1}  Outside blood  pressures are {enter patient reported home BP, or 'not being checked':1}.  --------------------------------------------------------------------------------------------------- Lipid/Cholesterol, follow-up  Last Lipid Panel: Lab Results  Component Value Date   CHOL 213 (H) 05/08/2022   LDLCALC 130 (H) 05/08/2022   HDL 69 05/08/2022   TRIG 78 05/08/2022    She was last seen for this 6 months ago.  Management since that visit includes start Crestor 5 mg daily.  She reports {excellent/good/fair/poor:19665} compliance with treatment. She {is/is not:9024} having side effects. {document side effects if present:1}  Last metabolic panel Lab Results  Component Value Date   GLUCOSE 102 (H) 03/04/2022   NA 137 03/04/2022   K 4.3 03/04/2022   BUN 21 03/04/2022   CREATININE 0.64 03/04/2022   EGFR 97 03/04/2022   GFRNONAA 98 10/13/2019   CALCIUM 9.6 03/04/2022   AST 32 03/04/2022   ALT 29 03/04/2022   The 10-year ASCVD risk score (Arnett DK, et al., 2019) is: 14.9%  ---------------------------------------------------------------------------------------------------   Medications: Outpatient Medications Prior to Visit  Medication Sig   ALPRAZolam (XANAX) 0.25 MG tablet Take 1 tablet (0.25 mg total) by mouth 2 (two) times daily as needed for anxiety.   B Complex Vitamins (VITAMIN B COMPLEX PO) Take 1 tablet by mouth daily.   Cholecalciferol (VITAMIN D3) 125  MCG (5000 UT) CAPS Take 1 capsule (5,000 Units total) by mouth daily.   Coenzyme Q10 (COQ10) 200 MG CAPS Take 200 mg by mouth daily with breakfast.   fluticasone (FLONASE) 50 MCG/ACT nasal spray Use 2 spray(s) in each nostril once daily   Insulin Pen Needle (BD PEN NEEDLE NANO 2ND GEN) 32G X 4 MM MISC 1 Package by Does not apply route 2 (two) times daily.   levocetirizine (XYZAL) 5 MG tablet Take 1 tablet (5 mg total) by mouth every evening.   MAGNESIUM GLYCINATE PO Take 240 mg by mouth.   MULTIPLE VITAMIN PO Take 1 tablet by mouth  daily.   Omega-3 Fatty Acids (FISH OIL) 1200 MG CAPS Take 2 capsules (2,400 mg total) by mouth daily.   rosuvastatin (CRESTOR) 10 MG tablet Take 1 tablet (10 mg total) by mouth daily.   Semaglutide,0.25 or 0.5MG /DOS, (OZEMPIC, 0.25 OR 0.5 MG/DOSE,) 2 MG/3ML SOPN Inject 0.5 mg into the skin once a week.   sertraline (ZOLOFT) 50 MG tablet Take 1 tablet by mouth once daily   triamterene-hydrochlorothiazide (MAXZIDE-25) 37.5-25 MG tablet Take 1 tablet by mouth daily.   No facility-administered medications prior to visit.    Review of Systems  {Labs  Heme  Chem  Endocrine  Serology  Results Review (optional):23779}   Objective    There were no vitals taken for this visit. {Show previous vital signs (optional):23777}  Physical Exam  ***  No results found for any visits on 09/01/22.  Assessment & Plan     ***  No follow-ups on file.      {provider attestation***:1}   Shirlee Latch, MD  [facility] [phone] (phone) [phone] (fax)  [facility]

## 2022-09-01 ENCOUNTER — Encounter: Payer: Self-pay | Admitting: Family Medicine

## 2022-09-01 ENCOUNTER — Ambulatory Visit (INDEPENDENT_AMBULATORY_CARE_PROVIDER_SITE_OTHER): Payer: Medicare Other | Admitting: Family Medicine

## 2022-09-01 VITALS — BP 128/58 | HR 92 | Temp 97.6°F | Resp 16 | Wt 198.0 lb

## 2022-09-01 DIAGNOSIS — I152 Hypertension secondary to endocrine disorders: Secondary | ICD-10-CM

## 2022-09-01 DIAGNOSIS — J302 Other seasonal allergic rhinitis: Secondary | ICD-10-CM

## 2022-09-01 DIAGNOSIS — E1159 Type 2 diabetes mellitus with other circulatory complications: Secondary | ICD-10-CM

## 2022-09-01 DIAGNOSIS — F419 Anxiety disorder, unspecified: Secondary | ICD-10-CM

## 2022-09-01 DIAGNOSIS — E1169 Type 2 diabetes mellitus with other specified complication: Secondary | ICD-10-CM | POA: Diagnosis not present

## 2022-09-01 DIAGNOSIS — E785 Hyperlipidemia, unspecified: Secondary | ICD-10-CM

## 2022-09-01 DIAGNOSIS — E1165 Type 2 diabetes mellitus with hyperglycemia: Secondary | ICD-10-CM

## 2022-09-01 DIAGNOSIS — Z1211 Encounter for screening for malignant neoplasm of colon: Secondary | ICD-10-CM

## 2022-09-01 LAB — POCT GLYCOSYLATED HEMOGLOBIN (HGB A1C): Hemoglobin A1C: 5.9 % — AB (ref 4.0–5.6)

## 2022-09-01 MED ORDER — SCOPOLAMINE 1 MG/3DAYS TD PT72: 1.0000 | MEDICATED_PATCH | TRANSDERMAL | 0 refills | Status: DC

## 2022-09-01 MED ORDER — FLUTICASONE PROPIONATE 50 MCG/ACT NA SUSP: 2.0000 | Freq: Every day | NASAL | 11 refills | Status: DC

## 2022-09-01 NOTE — Assessment & Plan Note (Signed)
Chronic and well-controlled ?Continue Zoloft 50 mg daily ?Continue to use Xanax very sparingly for situational anxiety, including public speaking ?

## 2022-09-01 NOTE — Assessment & Plan Note (Signed)
Well controlled Continue current medications Recheck metabolic panel F/u in 6 months  

## 2022-09-01 NOTE — Assessment & Plan Note (Signed)
Well controlled on today's A1c Continue current medications UTD on vaccines, eye exam Foot exam and UACR today On Statin Discussed diet and exercise F/u in 6 months

## 2022-09-01 NOTE — Assessment & Plan Note (Signed)
Previously uncontrolled Continue statin - started ~55m ago Repeat FLP and CMP Goal LDL < 70

## 2022-09-04 DIAGNOSIS — E1165 Type 2 diabetes mellitus with hyperglycemia: Secondary | ICD-10-CM | POA: Diagnosis not present

## 2022-09-04 DIAGNOSIS — E1159 Type 2 diabetes mellitus with other circulatory complications: Secondary | ICD-10-CM | POA: Diagnosis not present

## 2022-09-04 DIAGNOSIS — I152 Hypertension secondary to endocrine disorders: Secondary | ICD-10-CM | POA: Diagnosis not present

## 2022-09-04 DIAGNOSIS — E1169 Type 2 diabetes mellitus with other specified complication: Secondary | ICD-10-CM | POA: Diagnosis not present

## 2022-09-05 LAB — MICROALBUMIN / CREATININE URINE RATIO
Creatinine, Urine: 97.8 mg/dL
Microalb/Creat Ratio: 4 mg/g creat (ref 0–29)
Microalbumin, Urine: 4.3 ug/mL

## 2022-09-05 LAB — COMPREHENSIVE METABOLIC PANEL
ALT: 30 IU/L (ref 0–32)
AST: 32 IU/L (ref 0–40)
Albumin/Globulin Ratio: 2 (ref 1.2–2.2)
Albumin: 4.5 g/dL (ref 3.9–4.9)
Alkaline Phosphatase: 71 IU/L (ref 44–121)
BUN/Creatinine Ratio: 31 — ABNORMAL HIGH (ref 12–28)
BUN: 22 mg/dL (ref 8–27)
Bilirubin Total: 0.7 mg/dL (ref 0.0–1.2)
CO2: 22 mmol/L (ref 20–29)
Calcium: 9.6 mg/dL (ref 8.7–10.3)
Chloride: 96 mmol/L (ref 96–106)
Creatinine, Ser: 0.7 mg/dL (ref 0.57–1.00)
Globulin, Total: 2.2 g/dL (ref 1.5–4.5)
Glucose: 103 mg/dL — ABNORMAL HIGH (ref 70–99)
Potassium: 4.3 mmol/L (ref 3.5–5.2)
Sodium: 134 mmol/L (ref 134–144)
Total Protein: 6.7 g/dL (ref 6.0–8.5)
eGFR: 95 mL/min/{1.73_m2} (ref >59)

## 2022-09-05 LAB — LIPID PANEL
Chol/HDL Ratio: 3.2 ratio (ref 0.0–4.4)
Cholesterol, Total: 189 mg/dL (ref 100–199)
HDL: 60 mg/dL (ref >39)
LDL Chol Calc (NIH): 115 mg/dL — ABNORMAL HIGH (ref 0–99)
Triglycerides: 78 mg/dL (ref 0–149)
VLDL Cholesterol Cal: 14 mg/dL (ref 5–40)

## 2022-09-07 ENCOUNTER — Ambulatory Visit (INDEPENDENT_AMBULATORY_CARE_PROVIDER_SITE_OTHER): Payer: Medicare Other | Admitting: Family Medicine

## 2022-09-07 ENCOUNTER — Encounter (INDEPENDENT_AMBULATORY_CARE_PROVIDER_SITE_OTHER): Payer: Self-pay | Admitting: Family Medicine

## 2022-09-07 VITALS — BP 119/78 | HR 75 | Temp 98.6°F | Ht 66.0 in | Wt 193.0 lb

## 2022-09-07 DIAGNOSIS — I152 Hypertension secondary to endocrine disorders: Secondary | ICD-10-CM | POA: Diagnosis not present

## 2022-09-07 DIAGNOSIS — E1159 Type 2 diabetes mellitus with other circulatory complications: Secondary | ICD-10-CM

## 2022-09-07 DIAGNOSIS — Z7985 Long-term (current) use of injectable non-insulin antidiabetic drugs: Secondary | ICD-10-CM

## 2022-09-07 DIAGNOSIS — E669 Obesity, unspecified: Secondary | ICD-10-CM | POA: Diagnosis not present

## 2022-09-07 DIAGNOSIS — Z6831 Body mass index (BMI) 31.0-31.9, adult: Secondary | ICD-10-CM

## 2022-09-07 DIAGNOSIS — E1165 Type 2 diabetes mellitus with hyperglycemia: Secondary | ICD-10-CM | POA: Diagnosis not present

## 2022-09-07 MED ORDER — OZEMPIC (0.25 OR 0.5 MG/DOSE) 2 MG/3ML ~~LOC~~ SOPN: 0.5000 mg | PEN_INJECTOR | SUBCUTANEOUS | 0 refills | Status: DC

## 2022-09-07 NOTE — Progress Notes (Signed)
Chief Complaint:   OBESITY Taquita is here to discuss her progress with her obesity treatment plan along with follow-up of her obesity related diagnoses. Kesi is on the Category 3 Plan and states she is following her eating plan approximately 90% of the time. Audree states she is walking for 30 minutes 7 times per week.  Today's visit was #: 17 Starting weight: 202 LBS Starting date: 06/24/2021 Today's weight: 193 LBS Today's date: 09/07/2022 Total lbs lost to date: 9 LBS Total lbs lost since last in-office visit: 2 LBS  Interim History: Patient has been walking more and exercising more. She saw her PCP and her A1c is now 5.9! She has been enjoying getting outside and doing gardening.  She does occasionally have situational back pain.  She has been on the plan around 90% of the time.  She tried Asian chicken with soy sauce and a little bit of honey.  She did have a small amount of rice and is trying to incorporate more ethnic and varied spice combinations.   Subjective:   1. Type 2 diabetes mellitus with hyperglycemia, without long-term current use of insulin Patient is on Ozempic 0.5 mg weekly.  Patient last A1c was 5.9.  Patient denies GI side effects.  2. Hypertension associated with type 2 diabetes mellitus Patient blood pressure very well-controlled today.  Patient denies chest pain, chest pressure, headache.  Assessment/Plan:   1. Type 2 diabetes mellitus with hyperglycemia, without long-term current use of insulin Refill- Semaglutide,0.25 or 0.5MG /DOS, (OZEMPIC, 0.25 OR 0.5 MG/DOSE,) 2 MG/3ML SOPN; Inject 0.5 mg into the skin once a week.  Dispense: 3 mL; Refill: 0  2. Hypertension associated with type 2 diabetes mellitus Continue Maxide daily.  Follow-up blood pressure next appointment.  3. Obesity with current BMI of 31.2 Porschea is currently in the action stage of change. As such, her goal is to continue with weight loss efforts. She has agreed to the Category 3  Plan.   Exercise goals:  As is.  Patient to focus on resistance training.  Behavioral modification strategies: increasing lean protein intake, meal planning and cooking strategies, keeping healthy foods in the home, and planning for success.  Krista Cooper has agreed to follow-up with our clinic in 3 weeks. She was informed of the importance of frequent follow-up visits to maximize her success with intensive lifestyle modifications for her multiple health conditions.   Objective:   Blood pressure 119/78, pulse 75, temperature 98.6 F (37 C), height  (1.676 m), weight 193 lb (87.5 kg), SpO2 98 %. Body mass index is 31.15 kg/m.  General: Cooperative, alert, well developed, in no acute distress. HEENT: Conjunctivae and lids unremarkable. Cardiovascular: Regular rhythm.  Lungs: Normal work of breathing. Neurologic: No focal deficits.   Lab Results  Component Value Date   CREATININE 0.70 09/04/2022   BUN 22 09/04/2022   NA 134 09/04/2022   K 4.3 09/04/2022   CL 96 09/04/2022   CO2 22 09/04/2022   Lab Results  Component Value Date   ALT 30 09/04/2022   AST 32 09/04/2022   ALKPHOS 71 09/04/2022   BILITOT 0.7 09/04/2022   Lab Results  Component Value Date   HGBA1C 5.9 (A) 09/01/2022   HGBA1C 6.3 (H) 03/04/2022   HGBA1C 6.4 (H) 10/14/2021   HGBA1C 6.5 (H) 06/24/2021   HGBA1C 6.3 (H) 02/20/2021   Lab Results  Component Value Date   INSULIN 9.0 10/14/2021   INSULIN 11.3 06/24/2021   Lab Results  Component Value Date   TSH 2.120 06/24/2021   Lab Results  Component Value Date   CHOL 189 09/04/2022   HDL 60 09/04/2022   LDLCALC 115 (H) 09/04/2022   TRIG 78 09/04/2022   CHOLHDL 3.2 09/04/2022   Lab Results  Component Value Date   VD25OH 48.6 03/04/2022   VD25OH 58.7 10/14/2021   VD25OH 39.9 06/24/2021   Lab Results  Component Value Date   WBC 4.9 06/24/2021   HGB 13.6 06/24/2021   HCT 41.5 06/24/2021   MCV 88 06/24/2021   PLT 373 06/24/2021   No results  found for: "IRON", "TIBC", "FERRITIN"  Attestation Statements:   Reviewed by clinician on day of visit: allergies, medications, problem list, medical history, surgical history, family history, social history, and previous encounter notes.  I, Malcolm Metro, RMA, am acting as transcriptionist for Reuben Likes, MD.  I have reviewed the above documentation for accuracy and completeness, and I agree with the above. - Reuben Likes, MD

## 2022-10-01 ENCOUNTER — Other Ambulatory Visit: Payer: Self-pay | Admitting: Family Medicine

## 2022-10-05 ENCOUNTER — Encounter (INDEPENDENT_AMBULATORY_CARE_PROVIDER_SITE_OTHER): Payer: Self-pay | Admitting: Family Medicine

## 2022-10-05 ENCOUNTER — Ambulatory Visit (INDEPENDENT_AMBULATORY_CARE_PROVIDER_SITE_OTHER): Payer: Medicare Other | Admitting: Family Medicine

## 2022-10-05 VITALS — BP 120/71 | HR 83 | Temp 98.5°F | Ht 66.0 in | Wt 195.0 lb

## 2022-10-05 DIAGNOSIS — E1165 Type 2 diabetes mellitus with hyperglycemia: Secondary | ICD-10-CM

## 2022-10-05 DIAGNOSIS — Z6831 Body mass index (BMI) 31.0-31.9, adult: Secondary | ICD-10-CM

## 2022-10-05 DIAGNOSIS — E1159 Type 2 diabetes mellitus with other circulatory complications: Secondary | ICD-10-CM | POA: Diagnosis not present

## 2022-10-05 DIAGNOSIS — Z7985 Long-term (current) use of injectable non-insulin antidiabetic drugs: Secondary | ICD-10-CM

## 2022-10-05 DIAGNOSIS — E669 Obesity, unspecified: Secondary | ICD-10-CM | POA: Diagnosis not present

## 2022-10-05 DIAGNOSIS — I152 Hypertension secondary to endocrine disorders: Secondary | ICD-10-CM | POA: Diagnosis not present

## 2022-10-05 MED ORDER — OZEMPIC (0.25 OR 0.5 MG/DOSE) 2 MG/3ML ~~LOC~~ SOPN
0.5000 mg | PEN_INJECTOR | SUBCUTANEOUS | 0 refills | Status: DC
Start: 2022-10-05 — End: 2022-12-29

## 2022-10-05 NOTE — Progress Notes (Signed)
Chief Complaint:   OBESITY Krista Cooper is here to discuss her progress with her obesity treatment plan along with follow-up of her obesity related diagnoses. Krista Cooper is on the Category 3 Plan and states she is following her eating plan approximately 85% of the time. Krista Cooper states she is walking, doing yoga, and exercising for 30-60 minutes 1-6 times per week.  Today's visit was #: 18 Starting weight: 202 lbs Starting date: 06/24/2021 Today's weight: 195 lbs Today's date: 10/05/2022 Total lbs lost to date: 7 Total lbs lost since last in-office visit: 0  Interim History: Patient was hosting her cousin for 5-6 days and may have done some indulgent eating while her cousin was there.  Mentions she hasn't done her exercises like she wanted to do.  She goes on a cruise June 14th for 10 days and feels like she can manage on that. No plans for [facility] Day.  Plans to be mostly around home.  Has tried cottage cheese, eggs and almond flour for pancakes with some baking powder.   Subjective:   1. Type 2 diabetes mellitus with hyperglycemia, without long-term current use of insulin (HCC) [name] is on Ozempic weekly.  She denies GI side effects of Ozempic.  Her recent A1c was 5.9.  2. Hypertension associated with diabetes (HCC) [name] denies chest pain, chest pressure, or headache.  Her blood pressure is controlled today.  Assessment/Plan:   1. Type 2 diabetes mellitus with hyperglycemia, without long-term current use of insulin (HCC) We will refill Ozempic at 0.5 mg for 90 days.  - Semaglutide,0.25 or 0.5MG /DOS, (OZEMPIC, 0.25 OR 0.5 MG/DOSE,) 2 MG/3ML SOPN; Inject 0.5 mg into the skin once a week.  Dispense: 9 mL; Refill: 0  2. Hypertension associated with diabetes [facility]) Adi will continue her current medications with no change in dose.  3. BMI 31.0-31.9,adult  4. Obesity with starting BMI of 31.5 [name] is currently in the action stage of change. As such, her goal is to continue with  weight loss efforts. She has agreed to the Category 3 Plan.   Exercise goals: All adults should avoid inactivity. Some physical activity is better than none, and adults who participate in any amount of physical activity gain some health benefits.  Behavioral modification strategies: increasing lean protein intake, meal planning and cooking strategies, keeping healthy foods in the home, and planning for success.  Krista Cooper has agreed to follow-up with our clinic in 3 to 4 weeks. She was informed of the importance of frequent follow-up visits to maximize her success with intensive lifestyle modifications for her multiple health conditions.   Objective:   Blood pressure 120/71, pulse 83, temperature 98.5 F (36.9 C), height 5\' 6"  (1.676 m), weight 195 lb (88.5 kg), SpO2 98 %. Body mass index is 31.47 kg/m.  General: Cooperative, alert, well developed, in no acute distress. HEENT: Conjunctivae and lids unremarkable. Cardiovascular: Regular rhythm.  Lungs: Normal work of breathing. Neurologic: No focal deficits.   Lab Results  Component Value Date   CREATININE 0.70 09/04/2022   BUN 22 09/04/2022   NA 134 09/04/2022   K 4.3 09/04/2022   CL 96 09/04/2022   CO2 22 09/04/2022   Lab Results  Component Value Date   ALT 30 09/04/2022   AST 32 09/04/2022   ALKPHOS 71 09/04/2022   BILITOT 0.7 09/04/2022   Lab Results  Component Value Date   HGBA1C 5.9 (A) 09/01/2022   HGBA1C 6.3 (H) 03/04/2022   HGBA1C 6.4 (H) 10/14/2021  HGBA1C 6.5 (H) 06/24/2021   HGBA1C 6.3 (H) 02/20/2021   Lab Results  Component Value Date   INSULIN 9.0 10/14/2021   INSULIN 11.3 06/24/2021   Lab Results  Component Value Date   TSH 2.120 06/24/2021   Lab Results  Component Value Date   CHOL 189 09/04/2022   HDL 60 09/04/2022   LDLCALC 115 (H) 09/04/2022   TRIG 78 09/04/2022   CHOLHDL 3.2 09/04/2022   Lab Results  Component Value Date   VD25OH 48.6 03/04/2022   VD25OH 58.7 10/14/2021   VD25OH 39.9  06/24/2021   Lab Results  Component Value Date   WBC 4.9 06/24/2021   HGB 13.6 06/24/2021   HCT 41.5 06/24/2021   MCV 88 06/24/2021   PLT 373 06/24/2021   No results found for: "IRON", "TIBC", "FERRITIN"  Attestation Statements:   Reviewed by clinician on day of visit: allergies, medications, problem list, medical history, surgical history, family history, social history, and previous encounter notes.   I, Burt Knack, am acting as transcriptionist for Reuben Likes, MD. I have reviewed the above documentation for accuracy and completeness, and I agree with the above. - Reuben Likes, MD

## 2022-10-14 DIAGNOSIS — Z1211 Encounter for screening for malignant neoplasm of colon: Secondary | ICD-10-CM | POA: Diagnosis not present

## 2022-10-15 ENCOUNTER — Other Ambulatory Visit: Payer: Self-pay | Admitting: Family Medicine

## 2022-10-15 DIAGNOSIS — Z1231 Encounter for screening mammogram for malignant neoplasm of breast: Secondary | ICD-10-CM

## 2022-10-20 LAB — COLOGUARD: COLOGUARD: POSITIVE — AB

## 2022-10-22 ENCOUNTER — Telehealth: Payer: Self-pay | Admitting: Family Medicine

## 2022-10-22 DIAGNOSIS — Z1211 Encounter for screening for malignant neoplasm of colon: Secondary | ICD-10-CM

## 2022-10-22 NOTE — Telephone Encounter (Signed)
Referral Request - Has patient seen PCP for this complaint? yes *If NO, is insurance requiring patient see PCP for this issue before PCP can refer them? Referral for which specialty: colonoscopy Preferred provider/office: ? She mentioned Dr Era Skeen from past visit Reason for referral: abnormal colonoscopy

## 2022-10-22 NOTE — Telephone Encounter (Signed)
Ok to place referral as requested - GI for colon cancer screening - [facility] - Dr Servando Snare

## 2022-10-26 ENCOUNTER — Other Ambulatory Visit: Payer: Self-pay

## 2022-10-26 ENCOUNTER — Telehealth: Payer: Self-pay | Admitting: Gastroenterology

## 2022-10-26 DIAGNOSIS — Z1211 Encounter for screening for malignant neoplasm of colon: Secondary | ICD-10-CM

## 2022-10-26 MED ORDER — NA SULFATE-K SULFATE-MG SULF 17.5-3.13-1.6 GM/177ML PO SOLN: 1.0000 | Freq: Once | ORAL | 0 refills | Status: AC

## 2022-10-26 NOTE — Telephone Encounter (Signed)
Gastroenterology Pre-Procedure Review  Request Date: 12/22/22 Requesting Physician: Dr. Servando Snare  PATIENT REVIEW QUESTIONS: The patient responded to the following health history questions as indicated:    1. Are you having any GI issues? Pt experiencing GI issues since starting Ozempic states she has more urgency with her bowel movements, nausea associated with hunger.  No stool changes.  Abnormal cologuard test. 2. Do you have a personal history of Polyps? no 3. Do you have a family history of Colon Cancer or Polyps? no 4. Diabetes Mellitus? yes (takes Ozempic has been advised to  stop 7 days prior to colonoscopy) 5. Joint replacements in the past 12 months?no 6. Major health problems in the past 3 months?no 7. Any artificial heart valves, MVP, or defibrillator?no    MEDICATIONS & ALLERGIES:    Patient reports the following regarding taking any anticoagulation/antiplatelet therapy:   Plavix, Coumadin, Eliquis, Xarelto, Lovenox, Pradaxa, Brilinta, or Effient? no Aspirin? no  Patient confirms/reports the following medications:  Current Outpatient Medications  Medication Sig Dispense Refill   ALPRAZolam (XANAX) 0.25 MG tablet Take 1 tablet (0.25 mg total) by mouth 2 (two) times daily as needed for anxiety. 10 tablet 0   Cholecalciferol (VITAMIN D3) 125 MCG (5000 UT) CAPS Take 1 capsule (5,000 Units total) by mouth daily. 30 capsule    Coenzyme Q10 (COQ10) 200 MG CAPS Take 200 mg by mouth daily with breakfast. 90 capsule 3   fluticasone (FLONASE) 50 MCG/ACT nasal spray Place 2 sprays into both nostrils daily. 16 g 11   Insulin Pen Needle (BD PEN NEEDLE NANO 2ND GEN) 32G X 4 MM MISC 1 Package by Does not apply route 2 (two) times daily. 100 each 0   levocetirizine (XYZAL) 5 MG tablet Take 1 tablet (5 mg total) by mouth every evening. 90 tablet 3   MAGNESIUM GLYCINATE PO Take 240 mg by mouth.     MULTIPLE VITAMIN PO Take 1 tablet by mouth daily.     Omega-3 Fatty Acids (FISH OIL) 1200 MG CAPS  Take 2 capsules (2,400 mg total) by mouth daily. 90 capsule 3   rosuvastatin (CRESTOR) 10 MG tablet Take 1 tablet (10 mg total) by mouth daily. 90 tablet 3   scopolamine (TRANSDERM-SCOP) 1 MG/3DAYS Place 1 patch (1.5 mg total) onto the skin every 3 (three) days. 10 patch 0   Semaglutide,0.25 or 0.5MG /DOS, (OZEMPIC, 0.25 OR 0.5 MG/DOSE,) 2 MG/3ML SOPN Inject 0.5 mg into the skin once a week. 9 mL 0   sertraline (ZOLOFT) 50 MG tablet Take 1 tablet by mouth once daily 90 tablet 1   triamterene-hydrochlorothiazide (MAXZIDE-25) 37.5-25 MG tablet Take 1 tablet by mouth once daily 90 tablet 0   No current facility-administered medications for this visit.    Patient confirms/reports the following allergies:  Allergies  Allergen Reactions   Codeine     Itching; Can take Cough Syrup   Lisinopril Cough   Morphine And Codeine Itching    No orders of the defined types were placed in this encounter.   AUTHORIZATION INFORMATION Primary Insurance: 1D#: Group #:  Secondary Insurance: 1D#: Group #:  SCHEDULE INFORMATION: Date: 12/22/22 Time: Location: [facility]

## 2022-10-26 NOTE — Telephone Encounter (Signed)
Pt left vmm to schedule colonoscopy with Dr. Wohl please return call 

## 2022-10-27 ENCOUNTER — Ambulatory Visit (INDEPENDENT_AMBULATORY_CARE_PROVIDER_SITE_OTHER): Payer: Medicare Other | Admitting: Family Medicine

## 2022-10-27 ENCOUNTER — Encounter (INDEPENDENT_AMBULATORY_CARE_PROVIDER_SITE_OTHER): Payer: Self-pay | Admitting: Family Medicine

## 2022-10-27 VITALS — BP 114/70 | HR 84 | Temp 98.4°F | Ht 66.0 in | Wt 195.0 lb

## 2022-10-27 DIAGNOSIS — E1169 Type 2 diabetes mellitus with other specified complication: Secondary | ICD-10-CM

## 2022-10-27 DIAGNOSIS — Z7985 Long-term (current) use of injectable non-insulin antidiabetic drugs: Secondary | ICD-10-CM

## 2022-10-27 DIAGNOSIS — E785 Hyperlipidemia, unspecified: Secondary | ICD-10-CM | POA: Diagnosis not present

## 2022-10-27 DIAGNOSIS — E1165 Type 2 diabetes mellitus with hyperglycemia: Secondary | ICD-10-CM

## 2022-10-27 DIAGNOSIS — E669 Obesity, unspecified: Secondary | ICD-10-CM

## 2022-10-27 DIAGNOSIS — Z6831 Body mass index (BMI) 31.0-31.9, adult: Secondary | ICD-10-CM

## 2022-10-27 NOTE — Progress Notes (Signed)
Chief Complaint:   OBESITY Krista Cooper is here to discuss her progress with her obesity treatment plan along with follow-up of her obesity related diagnoses. Krista Cooper is on the Category 3 Plan and states she is following her eating plan approximately 88% of the time. Krista Cooper states she is doing barre, walking, yoga, class for 30-60 minutes 1-6 times per week.  Today's visit was #: 19 Starting weight: 202 lbs Starting date: 06/24/2021 Today's weight: 195 lbs Today's date: 10/27/2022 Total lbs lost to date: 7 Total lbs lost since last in-office visit: 0  Interim History: Patient has been implementing more consistent activity/ exercise over the last month.  She doesn't think she did much for Memorial Day weekend.  She was at a cookout this past weekend for church and tried to be mindful of food choices and quantity of indulgence. Thinks she needs to bump up total protein amount for dinner meal.  Doesn't have any plans for the next few weeks which includes the 4th of July.   Subjective:   1. Hyperlipidemia associated with type 2 diabetes mellitus (HCC) Patient is on Crestor 10 mg daily. She denies transaminitis or myalgias.   2. Type 2 diabetes mellitus with hyperglycemia, without long-term current use of insulin (HCC) Patient is on Ozempic 40.5 mg SubQ weekly, with no side effects noted.   Assessment/Plan:   1. Hyperlipidemia associated with type 2 diabetes mellitus [facility]) Patient will continue Crestor with no change on her medication dosage.   2. Type 2 diabetes mellitus with hyperglycemia, without long-term current use of insulin (HCC) Patient will continue Ozempic with no refill needed.   3. BMI 31.0-31.9,adult  4. Obesity with starting BMI of 32.6 Krista Cooper is currently in the action stage of change. As such, her goal is to continue with weight loss efforts. She has agreed to the Category 3 Plan.   Exercise goals: As is. Patient is to stay consistent with activity over  the next  month.   Behavioral modification strategies: increasing lean protein intake, meal planning and cooking strategies, keeping healthy foods in the home, and planning for success.  Krista Cooper has agreed to follow-up with our clinic in 4 weeks. She was informed of the importance of frequent follow-up visits to maximize her success with intensive lifestyle modifications for her multiple health conditions.   Objective:   Blood pressure 114/70, pulse 84, temperature 98.4 F (36.9 C), height 5\' 6"  (1.676 m), weight 195 lb (88.5 kg), SpO2 98 %. Body mass index is 31.47 kg/m.  General: Cooperative, alert, well developed, in no acute distress. HEENT: Conjunctivae and lids unremarkable. Cardiovascular: Regular rhythm.  Lungs: Normal work of breathing. Neurologic: No focal deficits.   Lab Results  Component Value Date   CREATININE 0.70 09/04/2022   BUN 22 09/04/2022   NA 134 09/04/2022   K 4.3 09/04/2022   CL 96 09/04/2022   CO2 22 09/04/2022   Lab Results  Component Value Date   ALT 30 09/04/2022   AST 32 09/04/2022   ALKPHOS 71 09/04/2022   BILITOT 0.7 09/04/2022   Lab Results  Component Value Date   HGBA1C 5.9 (A) 09/01/2022   HGBA1C 6.3 (H) 03/04/2022   HGBA1C 6.4 (H) 10/14/2021   HGBA1C 6.5 (H) 06/24/2021   HGBA1C 6.3 (H) 02/20/2021   Lab Results  Component Value Date   INSULIN 9.0 10/14/2021   INSULIN 11.3 06/24/2021   Lab Results  Component Value Date   TSH 2.120 06/24/2021   Lab Results  Component Value Date   CHOL 189 09/04/2022   HDL 60 09/04/2022   LDLCALC 115 (H) 09/04/2022   TRIG 78 09/04/2022   CHOLHDL 3.2 09/04/2022   Lab Results  Component Value Date   VD25OH 48.6 03/04/2022   VD25OH 58.7 10/14/2021   VD25OH 39.9 06/24/2021   Lab Results  Component Value Date   WBC 4.9 06/24/2021   HGB 13.6 06/24/2021   HCT 41.5 06/24/2021   MCV 88 06/24/2021   PLT 373 06/24/2021   No results found for: "IRON", "TIBC", "FERRITIN"  Attestation Statements:    Reviewed by clinician on day of visit: allergies, medications, problem list, medical history, surgical history, family history, social history, and previous encounter notes.  Time spent on visit including pre-visit chart review and post-visit care and charting was 23 minutes.   I, Burt Knack, am acting as transcriptionist for Reuben Likes, MD.  I have reviewed the above documentation for accuracy and completeness, and I agree with the above. - Reuben Likes, MD

## 2022-11-07 ENCOUNTER — Other Ambulatory Visit: Payer: Self-pay | Admitting: Family Medicine

## 2022-11-09 NOTE — Telephone Encounter (Signed)
Requested Prescriptions  Pending Prescriptions Disp Refills   levocetirizine (XYZAL) 5 MG tablet [Pharmacy Med Name: Levocetirizine Dihydrochloride 5 MG Oral Tablet] 90 tablet 3    Sig: TAKE 1 TABLET BY MOUTH ONCE DAILY IN THE EVENING     Ear, Nose, and Throat:  Antihistamines - levocetirizine dihydrochloride Passed - 11/07/2022  6:40 AM      Passed - Cr in normal range and within 360 days    Creatinine, Ser  Date Value Ref Range Status  09/04/2022 0.70 0.57 - 1.00 mg/dL Final         Passed - eGFR is 10 or above and within 360 days    GFR calc Af Amer  Date Value Ref Range Status  10/13/2019 113 >59 mL/min/1.73 Final    Comment:    **[facility] currently reports eGFR in compliance with the current**   recommendations of the [employer]. [facility] will   update reporting as new guidelines are published from the NKF-ASN   Task force.    GFR calc non Af Amer  Date Value Ref Range Status  10/13/2019 98 >59 mL/min/1.73 Final   eGFR  Date Value Ref Range Status  09/04/2022 95 >59 mL/min/1.73 Final         Passed - Valid encounter within last 12 months    Recent Outpatient Visits           2 months ago Type 2 diabetes mellitus with hyperglycemia, without long-term current use of insulin [facility])   [facility] Dam[facility] Galateo, [name], MD   3 months ago Acute non-recurrent frontal sinusitis   [facility] Alfredia Ferguson, PA-C   8 months ago Seasonal allergic rhinitis, unspecified trigger   [facility] Caro Laroche, DO   1 year ago Hypertension associated with diabetes [facility])   [facility] Tornillo, [name], MD   1 year ago [facility] preventive visit   [facility], [name], MD       Future Appointments             In 3 months Bacigalupo, [name], MD [facility]

## 2022-11-16 DIAGNOSIS — D225 Melanocytic nevi of trunk: Secondary | ICD-10-CM | POA: Diagnosis not present

## 2022-11-16 DIAGNOSIS — L821 Other seborrheic keratosis: Secondary | ICD-10-CM | POA: Diagnosis not present

## 2022-11-16 DIAGNOSIS — L814 Other melanin hyperpigmentation: Secondary | ICD-10-CM | POA: Diagnosis not present

## 2022-11-16 DIAGNOSIS — L918 Other hypertrophic disorders of the skin: Secondary | ICD-10-CM | POA: Diagnosis not present

## 2022-11-23 ENCOUNTER — Encounter (INDEPENDENT_AMBULATORY_CARE_PROVIDER_SITE_OTHER): Payer: Self-pay | Admitting: Family Medicine

## 2022-11-23 ENCOUNTER — Telehealth (INDEPENDENT_AMBULATORY_CARE_PROVIDER_SITE_OTHER): Payer: Medicare Other | Admitting: Family Medicine

## 2022-11-23 VITALS — BP 129/74 | HR 76 | Temp 98.2°F | Ht 66.0 in | Wt 193.0 lb

## 2022-11-23 DIAGNOSIS — E1159 Type 2 diabetes mellitus with other circulatory complications: Secondary | ICD-10-CM

## 2022-11-23 DIAGNOSIS — Z7985 Long-term (current) use of injectable non-insulin antidiabetic drugs: Secondary | ICD-10-CM

## 2022-11-23 DIAGNOSIS — E1165 Type 2 diabetes mellitus with hyperglycemia: Secondary | ICD-10-CM

## 2022-11-23 DIAGNOSIS — I152 Hypertension secondary to endocrine disorders: Secondary | ICD-10-CM

## 2022-11-23 DIAGNOSIS — E669 Obesity, unspecified: Secondary | ICD-10-CM | POA: Diagnosis not present

## 2022-11-23 DIAGNOSIS — Z6831 Body mass index (BMI) 31.0-31.9, adult: Secondary | ICD-10-CM

## 2022-11-23 NOTE — Progress Notes (Signed)
TeleHealth Visit:  Due to the COVID-19 pandemic, this visit was completed with telemedicine (audio/video) technology to reduce patient and provider exposure as well as to preserve personal protective equipment.   Krista Cooper has verbally consented to this TeleHealth visit. The patient is located at home, the provider is located at home. The participants in this visit include the listed provider and patient. The visit was conducted today via MyChart video.   Chief Complaint: OBESITY Krista Cooper is here to discuss her progress with her obesity treatment plan along with follow-up of her obesity related diagnoses. Krista Cooper is on the Category 3 Plan and states she is following her eating plan approximately 85% of the time. Krista [place] states she is exercising for 30-45 minutes 3 times per week.  Today's visit was #: 20 Starting weight: 202 lbs Starting date: 06/24/2021 Today's weight: 193 lbs Today's date: 11/23/2022 Total lbs lost to date: 9 Total lbs lost since last in-office visit: 2  Interim History: Since last appointment patient went on a cruise from June 14-24th and enjoyed herself- she did not exercise while away.  The last few days she did do some walking but has been limited due to the heat.  She did have just a mixed drink 4 days while away. She is planning a trip [place] coming [place].  She has a colonoscopy coming up early August and needs to be on a liquid diet for that week.  After her trip she wants to get back to her Category plan as quickly as she can.  Subjective:   1. Hypertension associated with diabetes (HCC) Patient's blood pressure is well-controlled.  She is on triamterene-hydrochlorothiazide.  She denies chest pain, chest pressure, or headache.  2. Type 2 diabetes mellitus with hyperglycemia, without long-term current use of insulin (HCC) Patient's last A1c was 5.9, and she is doing well on Ozempic.  No side effects were noted by the patient.  She will have to hold off for 1  week for colonoscopy.  Assessment/Plan:   1. Hypertension associated with diabetes [facility]) Patient will continue her current medications with no change in dose.  2. Type 2 diabetes mellitus with hyperglycemia, without long-term current use of insulin (HCC) We will follow-up on labs in 2 to 3 months.  Patient will continue current dose of Ozempic.  3. BMI 31.0-31.9,adult  4. Obesity with starting BMI of 32.6 Krista [place] is currently in the action stage of change. As such, her goal is to continue with weight loss efforts. She has agreed to the Category 2 Plan.   Exercise goals: As is.   Behavioral modification strategies: increasing lean protein intake, meal planning and cooking strategies, keeping healthy foods in the home, and planning for success.  Krista Cooper has agreed to follow-up with our clinic in 5 weeks. She was informed of the importance of frequent follow-up visits to maximize her success with intensive lifestyle modifications for her multiple health conditions.  Objective:   VITALS: Per patient if applicable, see vitals. GENERAL: Alert and in no acute distress. CARDIOPULMONARY: No increased WOB. Speaking in clear sentences.  PSYCH: Pleasant and cooperative. Speech normal rate and rhythm. Affect is appropriate. Insight and judgement are appropriate. Attention is focused, linear, and appropriate.  NEURO: Oriented as arrived to appointment on time with no prompting.   Lab Results  Component Value Date   CREATININE 0.70 09/04/2022   BUN 22 09/04/2022   NA 134 09/04/2022   K 4.3 09/04/2022   CL 96 09/04/2022   CO2  22 09/04/2022   Lab Results  Component Value Date   ALT 30 09/04/2022   AST 32 09/04/2022   ALKPHOS 71 09/04/2022   BILITOT 0.7 09/04/2022   Lab Results  Component Value Date   HGBA1C 5.9 (A) 09/01/2022   HGBA1C 6.3 (H) 03/04/2022   HGBA1C 6.4 (H) 10/14/2021   HGBA1C 6.5 (H) 06/24/2021   HGBA1C 6.3 (H) 02/20/2021   Lab Results  Component Value Date    INSULIN 9.0 10/14/2021   INSULIN 11.3 06/24/2021   Lab Results  Component Value Date   TSH 2.120 06/24/2021   Lab Results  Component Value Date   CHOL 189 09/04/2022   HDL 60 09/04/2022   LDLCALC 115 (H) 09/04/2022   TRIG 78 09/04/2022   CHOLHDL 3.2 09/04/2022   Lab Results  Component Value Date   VD25OH 48.6 03/04/2022   VD25OH 58.7 10/14/2021   VD25OH 39.9 06/24/2021   Lab Results  Component Value Date   WBC 4.9 06/24/2021   HGB 13.6 06/24/2021   HCT 41.5 06/24/2021   MCV 88 06/24/2021   PLT 373 06/24/2021   No results found for: "IRON", "TIBC", "FERRITIN"  Attestation Statements:   Reviewed by clinician on day of visit: allergies, medications, problem list, medical history, surgical history, family history, social history, and previous encounter notes.   I, Burt Knack, am acting as transcriptionist for Reuben Likes, MD.  I have reviewed the above documentation for accuracy and completeness, and I agree with the above. - Reuben Likes, MD

## 2022-11-26 ENCOUNTER — Ambulatory Visit (INDEPENDENT_AMBULATORY_CARE_PROVIDER_SITE_OTHER): Payer: Medicare Other | Admitting: Family Medicine

## 2022-12-03 ENCOUNTER — Encounter (INDEPENDENT_AMBULATORY_CARE_PROVIDER_SITE_OTHER): Payer: Self-pay | Admitting: Family Medicine

## 2022-12-03 NOTE — Telephone Encounter (Signed)
Please advise 

## 2022-12-07 ENCOUNTER — Ambulatory Visit: Payer: Self-pay | Admitting: *Deleted

## 2022-12-07 ENCOUNTER — Telehealth (INDEPENDENT_AMBULATORY_CARE_PROVIDER_SITE_OTHER): Payer: Medicare Other | Admitting: Family Medicine

## 2022-12-07 ENCOUNTER — Encounter: Payer: Self-pay | Admitting: Family Medicine

## 2022-12-07 VITALS — Temp 98.0°F

## 2022-12-07 DIAGNOSIS — U071 COVID-19: Secondary | ICD-10-CM | POA: Insufficient documentation

## 2022-12-07 MED ORDER — NIRMATRELVIR/RITONAVIR (PAXLOVID)TABLET: 3.0000 | ORAL_TABLET | Freq: Two times a day (BID) | ORAL | 0 refills | Status: AC

## 2022-12-07 NOTE — Assessment & Plan Note (Signed)
Acute, self limiting S/s x days; request for antiviral given hx of T2DM and HTN as well as HLD CMP and eGFR reviewed Recommend 5 day quarantine to assist in spread of viral spores F/u with PCP as needed Should be OK for colon cancer screening in early August as it is >10 days away

## 2022-12-07 NOTE — Progress Notes (Signed)
MyChart Video Visit    Virtual Visit via Video Note   This format is felt to be most appropriate for this patient at this time. Physical exam was limited by quality of the video and audio technology used for the visit.   Patient location: home Provider location: [facility] [address]  [address] [place], [place] [address]   I discussed the limitations of evaluation and management by telemedicine and the availability of in person appointments. The patient expressed understanding and agreed to proceed.  Patient: Krista Cooper   DOB: 28-Aug-1955   67 y.o. Female  MRN: [id number] Visit Date: 12/07/2022  Today's healthcare provider: Jacky Kindle, FNP  Introduced to nurse practitioner role and practice setting.  All questions answered.  Discussed provider/patient relationship and expectations.  Chief Complaint  Patient presents with   Covid Positive    Patient reports testing positive for covid this morning. Reports symptoms of runny nose, aches, cough, throat feels tight and started as a scratchy throat on Saturday. Patient reports she doesn't really feel bad.   Subjective    HPI HPI     Covid Positive    Additional comments: Patient reports testing positive for covid this morning. Reports symptoms of runny nose, aches, cough, throat feels tight and started as a scratchy throat on Saturday. Patient reports she doesn't really feel bad.      Last edited by Acey Lav, CMA on 12/07/2022  2:02 PM.       Medications: Outpatient Medications Prior to Visit  Medication Sig   ALPRAZolam (XANAX) 0.25 MG tablet Take 1 tablet (0.25 mg total) by mouth 2 (two) times daily as needed for anxiety.   Cholecalciferol (VITAMIN D3) 125 MCG (5000 UT) CAPS Take 1 capsule (5,000 Units total) by mouth daily.   Coenzyme Q10 (COQ10) 200 MG CAPS Take 200 mg by mouth daily with breakfast.   fluticasone (FLONASE) 50 MCG/ACT nasal spray Place 2 sprays into both nostrils  daily.   Insulin Pen Needle (BD PEN NEEDLE NANO 2ND GEN) 32G X 4 MM MISC 1 Package by Does not apply route 2 (two) times daily.   levocetirizine (XYZAL) 5 MG tablet TAKE 1 TABLET BY MOUTH ONCE DAILY IN THE EVENING   MAGNESIUM GLYCINATE PO Take 240 mg by mouth.   MULTIPLE VITAMIN PO Take 1 tablet by mouth daily.   Omega-3 Fatty Acids (FISH OIL) 1200 MG CAPS Take 2 capsules (2,400 mg total) by mouth daily.   rosuvastatin (CRESTOR) 10 MG tablet Take 1 tablet (10 mg total) by mouth daily.   scopolamine (TRANSDERM-SCOP) 1 MG/3DAYS Place 1 patch (1.5 mg total) onto the skin every 3 (three) days.   Semaglutide,0.25 or 0.5MG /DOS, (OZEMPIC, 0.25 OR 0.5 MG/DOSE,) 2 MG/3ML SOPN Inject 0.5 mg into the skin once a week.   sertraline (ZOLOFT) 50 MG tablet Take 1 tablet by mouth once daily   triamterene-hydrochlorothiazide (MAXZIDE-25) 37.5-25 MG tablet Take 1 tablet by mouth once daily   No facility-administered medications prior to visit.    Review of Systems  Last metabolic panel Lab Results  Component Value Date   GLUCOSE 103 (H) 09/04/2022   NA 134 09/04/2022   K 4.3 09/04/2022   CL 96 09/04/2022   CO2 22 09/04/2022   BUN 22 09/04/2022   CREATININE 0.70 09/04/2022   EGFR 95 09/04/2022   CALCIUM 9.6 09/04/2022   PROT 6.7 09/04/2022   ALBUMIN 4.5 09/04/2022   LABGLOB 2.2 09/04/2022   AGRATIO 2.0  09/04/2022   BILITOT 0.7 09/04/2022   ALKPHOS 71 09/04/2022   AST 32 09/04/2022   ALT 30 09/04/2022    Objective    Temp 98 F (36.7 C) (Oral) Comment: 10: 00 am after calling to schedule visit  Physical Exam Constitutional:      Appearance: Normal appearance.  Pulmonary:     Effort: Pulmonary effort is normal.  Psychiatric:        Mood and Affect: Mood normal.        Behavior: Behavior normal.        Thought Content: Thought content normal.        Judgment: Judgment normal.      Assessment & Plan     Problem List Items Addressed This Visit       Other   COVID - Primary     Acute, self limiting S/s x days; request for antiviral given hx of T2DM and HTN as well as HLD CMP and eGFR reviewed Recommend 5 day quarantine to assist in spread of viral spores F/u with PCP as needed Should be OK for colon cancer screening in early August as it is >10 days away      Relevant Medications   nirmatrelvir/ritonavir (PAXLOVID) 20 x 150 MG & 10 x 100MG  TABS   Return if symptoms worsen or fail to improve.    I discussed the assessment and treatment plan with the patient. The patient was provided an opportunity to ask questions and all were answered. The patient agreed with the plan and demonstrated an understanding of the instructions.   The patient was advised to call back or seek an in-person evaluation if the symptoms worsen or if the condition fails to improve as anticipated.  I provided 10 minutes of face-to-face time during this encounter.  Leilani Merl, FNP, have reviewed all documentation for this visit. The documentation on 12/07/22 for the exam, diagnosis, procedures, and orders are all accurate and complete.  Jacky Kindle, FNP [facility] [phone] (phone) [phone] (fax)  [facility]

## 2022-12-07 NOTE — Telephone Encounter (Signed)
Message from De Blanch sent at 12/07/2022  8:19 AM EDT  Summary: positive for COVID   Pt stated she tested positive for COVID this morning. Stated symptoms started Friday-Saturday scratchy throat, runny nose. Mentioned that she was traveling. No SOB. Got vaccine on May 29th.   Seeking clinical advice.          Call History  Contact Date/Time Type Contact Phone/Fax User  12/07/2022 08:17 AM EDT Phone (Incoming) Rasheema, [place] (Self) [phone] (H) McGill, Alondra   Reason for Disposition  [1] HIGH RISK patient (e.g., weak immune system, age > 64 years, obesity with BMI 30 or higher, pregnant, chronic lung disease or other chronic medical condition) AND [2] COVID symptoms (e.g., cough, fever)  (Exceptions: Already seen by PCP and no new or worsening symptoms.)  Answer Assessment - Initial Assessment Questions 1. COVID-19 DIAGNOSIS: "How do you know that you have COVID?" (e.g., positive lab test or self-test, diagnosed by doctor or NP/PA, symptoms after exposure).     I've tested positive for Covid. 2. COVID-19 EXPOSURE: "Was there any known exposure to COVID before the symptoms began?" CDC Definition of close contact: within 6 feet (2 meters) for a total of 15 minutes or more over a 24-hour period.      I went on a cruise in June.    I came back on 24th.   I went to [facility]. [name] July 9-17 and I bet I got it on the plane or the airport.   3. ONSET: "When did the COVID-19 symptoms start?"      My nose started running on Sunday.  This morning I was hot when I got up.   My nose is still running.  I'm supposed to go to the beach this morning with my Aunt.  I tested for Covid this morning and it was positive.    4. WORST SYMPTOM: "What is your worst symptom?" (e.g., cough, fever, shortness of breath, muscle aches)     Not ased 5. COUGH: "Do you have a cough?" If Yes, ask: "How bad is the cough?"       No coughing.    Just an occasional cough   non productive.   No fever or body aches. 6.  FEVER: "Do you have a fever?" If Yes, ask: "What is your temperature, how was it measured, and when did it start?"     No    No chills. 7. RESPIRATORY STATUS: "Describe your breathing?" (e.g., normal; shortness of breath, wheezing, unable to speak)      No shortness of breath 8. BETTER-SAME-WORSE: "Are you getting better, staying the same or getting worse compared to yesterday?"  If getting worse, ask, "In what way?"     About the same.    9. OTHER SYMPTOMS: "Do you have any other symptoms?"  (e.g., chills, fatigue, headache, loss of smell or taste, muscle pain, sore throat)     Runny nose, Sat. I had a scratchy throat.   10. HIGH RISK DISEASE: "Do you have any chronic medical problems?" (e.g., asthma, heart or lung disease, weak immune system, obesity, etc.)       I have diabetes and on Ozempic.    11. VACCINE: "Have you had the COVID-19 vaccine?" If Yes, ask: "Which one, how many shots, when did you get it?"       In May 2024 12. PREGNANCY: "Is there any chance you are pregnant?" "When was your last menstrual period?"       N/A  due to age 44. O2 SATURATION MONITOR:  "Do you use an oxygen saturation monitor (pulse oximeter) at home?" If Yes, ask "What is your reading (oxygen level) today?" "What is your usual oxygen saturation reading?" (e.g., 95%)       N/A I had Covid Oct. 2021.   I took the Paxlovid back then.   I did fine with it.  Protocols used: Coronavirus (COVID-19) Diagnosed or Suspected-A-AH

## 2022-12-07 NOTE — Telephone Encounter (Signed)
  Chief Complaint: positive for Covid this morning (Mon) Symptoms: Runny nose, mild non productive cough. Frequency: Runny nose started Sun. Morning. Pertinent Negatives: Patient denies fever.   Had Covid in 2021 and took Paxlovid and did fine with it. Disposition: [] ED /[] Urgent Care (no appt availability in office) / [x] Appointment(In office/virtual)/ []  [place] [facility]/ [] Home Care/ [] Refused Recommended Disposition /[] [place] [facility]/ []  Follow-up with PCP Additional Notes: Appt made with Merita Norton, FNP for today at 2:20 via video.     Her internet can have spotty coverage [place] country so if the video doesn't work it may end up being a phone call.

## 2022-12-15 ENCOUNTER — Encounter: Payer: Self-pay | Admitting: Gastroenterology

## 2022-12-22 ENCOUNTER — Other Ambulatory Visit: Payer: Self-pay

## 2022-12-22 ENCOUNTER — Ambulatory Visit: Payer: Medicare Other

## 2022-12-22 ENCOUNTER — Ambulatory Visit
Admission: RE | Admit: 2022-12-22 | Discharge: 2022-12-22 | Disposition: A | Discharge status: Home / Self Care | Payer: Medicare Other | Source: Ambulatory Visit | Attending: Gastroenterology | Admitting: Gastroenterology

## 2022-12-22 ENCOUNTER — Encounter
Admission: RE | Disposition: A | Discharge status: Home / Self Care | Payer: Self-pay | Source: Ambulatory Visit | Attending: Gastroenterology

## 2022-12-22 ENCOUNTER — Encounter: Payer: Self-pay | Admitting: Gastroenterology

## 2022-12-22 DIAGNOSIS — Z7985 Long-term (current) use of injectable non-insulin antidiabetic drugs: Secondary | ICD-10-CM | POA: Insufficient documentation

## 2022-12-22 DIAGNOSIS — K635 Polyp of colon: Secondary | ICD-10-CM

## 2022-12-22 DIAGNOSIS — E669 Obesity, unspecified: Secondary | ICD-10-CM | POA: Insufficient documentation

## 2022-12-22 DIAGNOSIS — E119 Type 2 diabetes mellitus without complications: Secondary | ICD-10-CM | POA: Diagnosis not present

## 2022-12-22 DIAGNOSIS — E785 Hyperlipidemia, unspecified: Secondary | ICD-10-CM | POA: Insufficient documentation

## 2022-12-22 DIAGNOSIS — Z6831 Body mass index (BMI) 31.0-31.9, adult: Secondary | ICD-10-CM | POA: Insufficient documentation

## 2022-12-22 DIAGNOSIS — Z794 Long term (current) use of insulin: Secondary | ICD-10-CM | POA: Diagnosis not present

## 2022-12-22 DIAGNOSIS — D120 Benign neoplasm of cecum: Secondary | ICD-10-CM | POA: Insufficient documentation

## 2022-12-22 DIAGNOSIS — K5730 Diverticulosis of large intestine without perforation or abscess without bleeding: Secondary | ICD-10-CM | POA: Insufficient documentation

## 2022-12-22 DIAGNOSIS — Z1211 Encounter for screening for malignant neoplasm of colon: Secondary | ICD-10-CM | POA: Diagnosis not present

## 2022-12-22 DIAGNOSIS — K640 First degree hemorrhoids: Secondary | ICD-10-CM | POA: Diagnosis not present

## 2022-12-22 DIAGNOSIS — I10 Essential (primary) hypertension: Secondary | ICD-10-CM | POA: Diagnosis not present

## 2022-12-22 DIAGNOSIS — F419 Anxiety disorder, unspecified: Secondary | ICD-10-CM | POA: Diagnosis not present

## 2022-12-22 DIAGNOSIS — D122 Benign neoplasm of ascending colon: Secondary | ICD-10-CM | POA: Diagnosis not present

## 2022-12-22 HISTORY — PX: COLONOSCOPY WITH PROPOFOL: SHX5780

## 2022-12-22 HISTORY — PX: POLYPECTOMY: SHX5525

## 2022-12-22 SURGERY — COLONOSCOPY WITH PROPOFOL
Anesthesia: General

## 2022-12-22 MED ORDER — SODIUM CHLORIDE 0.9 % IV SOLN: INTRAVENOUS | Status: DC

## 2022-12-22 MED ORDER — PROPOFOL 500 MG/50ML IV EMUL
INTRAVENOUS | Status: DC | PRN
  Administered 2022-12-22: 130 ug/kg/min via INTRAVENOUS

## 2022-12-22 MED ORDER — PROPOFOL 10 MG/ML IV BOLUS
INTRAVENOUS | Status: DC | PRN
Start: 2022-12-22 — End: 2022-12-22
  Administered 2022-12-22: 20 mg via INTRAVENOUS
  Administered 2022-12-22 (×2): 40 mg via INTRAVENOUS

## 2022-12-22 MED ORDER — LIDOCAINE HCL (CARDIAC) PF 100 MG/5ML IV SOSY
PREFILLED_SYRINGE | INTRAVENOUS | Status: DC | PRN
  Administered 2022-12-22: 40 mg via INTRAVENOUS

## 2022-12-22 NOTE — Anesthesia Preprocedure Evaluation (Signed)
Anesthesia Evaluation  Patient identified by MRN, date of birth, ID band Patient awake    Reviewed: Allergy & Precautions, NPO status , Patient's Chart, lab work & pertinent test results  History of Anesthesia Complications Negative for: history of anesthetic complications  Airway Mallampati: III  TM Distance: <3 FB Neck ROM: full    Dental  (+) Chipped   Pulmonary neg pulmonary ROS, neg shortness of breath   Pulmonary exam normal        Cardiovascular Exercise Tolerance: Good hypertension, (-) angina Normal cardiovascular exam     Neuro/Psych  PSYCHIATRIC DISORDERS      negative neurological ROS     GI/Hepatic negative GI ROS, Neg liver ROS,neg GERD  ,,  Endo/Other  diabetes, Type 2    Renal/GU negative Renal ROS Bladder dysfunction      Musculoskeletal   Abdominal   Peds  Hematology negative hematology ROS (+)   Anesthesia Other Findings Past Medical History: No date: Allergy No date: Anxiety No date: Hyperlipidemia No date: Hypertension No date: Obesity No date: Prediabetes No date: Seasonal allergies  Past Surgical History: No date: CESAREAN SECTION No date: COLONOSCOPY 05/19/1995: FRACTURE SURGERY; Right     Comment:  PLATES AND PINS 05/18/1974: MOUTH SURGERY     Comment:  WISDOM TEETH REMOVED     Reproductive/Obstetrics negative OB ROS                             Anesthesia Physical Anesthesia Plan  ASA: 3  Anesthesia Plan: General   Post-op Pain Management:    Induction: Intravenous  PONV Risk Score and Plan: Propofol infusion and TIVA  Airway Management Planned: Natural Airway and Nasal Cannula  Additional Equipment:   Intra-op Plan:   Post-operative Plan:   Informed Consent: I have reviewed the patients History and Physical, chart, labs and discussed the procedure including the risks, benefits and alternatives for the proposed anesthesia with the  patient or authorized representative who has indicated his/her understanding and acceptance.     Dental Advisory Given  Plan Discussed with: Anesthesiologist, CRNA and Surgeon  Anesthesia Plan Comments: (Patient consented for risks of anesthesia including but not limited to:  - adverse reactions to medications - risk of airway placement if required - damage to eyes, teeth, lips or other oral mucosa - nerve damage due to positioning  - sore throat or hoarseness - Damage to heart, brain, nerves, lungs, other parts of body or loss of life  Patient voiced understanding.)       Anesthesia Quick Evaluation

## 2022-12-22 NOTE — Op Note (Signed)
[facility] Gastroenterology Patient Name: Krista Cooper Procedure Date: 12/22/2022 9:26 AM MRN: [id number] Account #: [id number] Date of Birth: 02-21-1956 Admit Type: Outpatient Age: 67 Room: [facility] ENDO ROOM 4 Gender: Female Note Status: Finalized Instrument Name: Prentice Docker [id number] Procedure:             Colonoscopy Indications:           Screening for colorectal malignant neoplasm Providers:             Midge Minium MD, MD Medicines:             Propofol per Anesthesia Complications:         No immediate complications. Procedure:             Pre-Anesthesia Assessment:                        - Prior to the procedure, a History and Physical was                         performed, and patient medications and allergies were                         reviewed. The patient's tolerance of previous                         anesthesia was also reviewed. The risks and benefits                         of the procedure and the sedation options and risks                         were discussed with the patient. All questions were                         answered, and informed consent was obtained. Prior                         Anticoagulants: The patient has taken no anticoagulant                         or antiplatelet agents. ASA Grade Assessment: II - A                         patient with mild systemic disease. After reviewing                         the risks and benefits, the patient was deemed in                         satisfactory condition to undergo the procedure.                        After obtaining informed consent, the colonoscope was                         passed under direct vision. Throughout the procedure,                         the patient's blood  pressure, pulse, and oxygen                         saturations were monitored continuously. The                         Colonoscope was introduced through the anus and                         advanced to the the  cecum, identified by appendiceal                         orifice and ileocecal valve. The colonoscopy was                         performed without difficulty. The patient tolerated                         the procedure well. The quality of the bowel                         preparation was excellent. Findings:      The perianal and digital rectal examinations were normal.      A 3 mm polyp was found in the cecum. The polyp was sessile. The polyp       was removed with a cold snare. Resection and retrieval were complete.      A 4 mm polyp was found in the ascending colon. The polyp was sessile.       The polyp was removed with a cold snare. Resection and retrieval were       complete.      Non-bleeding internal hemorrhoids were found during retroflexion. The       hemorrhoids were Grade I (internal hemorrhoids that do not prolapse).      Multiple small-mouthed diverticula were found in the descending colon. Impression:            - One 3 mm polyp in the cecum, removed with a cold                         snare. Resected and retrieved.                        - One 4 mm polyp in the ascending colon, removed with                         a cold snare. Resected and retrieved.                        - Non-bleeding internal hemorrhoids.                        - Diverticulosis in the descending colon. Recommendation:        - Discharge patient to home.                        - Resume previous diet.                        - Continue present medications.                        -  Await pathology results.                        - If the pathology report reveals adenomatous tissue,                         then repeat the colonoscopy for surveillance in 7                         years. Procedure Code(s):     --- Professional ---                        [phone], Colonoscopy, flexible; with removal of                         tumor(s), polyp(s), or other lesion(s) by snare                          technique Diagnosis Code(s):     --- Professional ---                        Z12.11, Encounter for screening for malignant neoplasm                         of colon                        D12.0, Benign neoplasm of cecum CPT copyright 2022 American Medical Association. All rights reserved. The codes documented in this report are preliminary and upon coder review may  be revised to meet current compliance requirements. Midge Minium MD, MD 12/22/2022 9:59:26 AM This report has been signed electronically. Number of Addenda: 0 Note Initiated On: 12/22/2022 9:26 AM Scope Withdrawal Time: 0 hours 11 minutes 4 seconds  Total Procedure Duration: 0 hours 16 minutes 20 seconds  Estimated Blood Loss:  Estimated blood loss: none.      [facility]

## 2022-12-22 NOTE — Transfer of Care (Signed)
Immediate Anesthesia Transfer of Care Note  Patient: Krista Cooper  Procedure(s) Performed: COLONOSCOPY WITH PROPOFOL POLYPECTOMY  Patient Location: PACU  Anesthesia Type:General  Level of Consciousness: drowsy  Airway & Oxygen Therapy: Patient Spontanous Breathing  Post-op Assessment: Report given to RN and Post -op Vital signs reviewed and stable  Post vital signs: Reviewed and stable  Last Vitals:  Vitals Value Taken Time  BP    Temp    Pulse 73 12/22/22 1000  Resp 18 12/22/22 1000  SpO2 96 % 12/22/22 1000  Vitals shown include unfiled device data.  Last Pain:  Vitals:   12/22/22 1000  TempSrc:   PainSc: 0-No pain         Complications: No notable events documented.

## 2022-12-22 NOTE — H&P (Signed)
Midge Minium, MD [facility] [address] [place], [place] [address] Phone: [phone] Fax : [phone]  Primary Care Physician:  Erasmo Downer, MD Primary Gastroenterologist:  Dr. Servando Snare  Pre-Procedure History & Physical: HPI:  Krista Cooper is a 67 y.o. female is here for a screening colonoscopy.   Past Medical History:  Diagnosis Date   Allergy    Anxiety    Hyperlipidemia    Hypertension    Obesity    Prediabetes    Seasonal allergies     Past Surgical History:  Procedure Laterality Date   CESAREAN SECTION     COLONOSCOPY     FRACTURE SURGERY Right 05/19/1995   PLATES AND PINS   MOUTH SURGERY  05/18/1974   WISDOM TEETH REMOVED    Prior to Admission medications   Medication Sig Start Date End Date Taking? Authorizing Provider  rosuvastatin (CRESTOR) 10 MG tablet Take 1 tablet (10 mg total) by mouth daily. 05/28/22  Yes Erasmo Downer, MD  sertraline (ZOLOFT) 50 MG tablet Take 1 tablet by mouth once daily 08/24/22  Yes Bacigalupo, [name], MD  triamterene-hydrochlorothiazide [facility]) 37.5-25 MG tablet Take 1 tablet by mouth once daily 10/01/22  Yes Bacigalupo, [name], MD  ALPRAZolam Prudy Feeler) 0.25 MG tablet Take 1 tablet (0.25 mg total) by mouth 2 (two) times daily as needed for anxiety. 03/10/22   Caro Laroche, DO  Cholecalciferol (VITAMIN D3) 125 MCG (5000 UT) CAPS Take 1 capsule (5,000 Units total) by mouth daily. 07/08/21   Langston Reusing, MD  Coenzyme Q10 (COQ10) 200 MG CAPS Take 200 mg by mouth daily with breakfast. 08/26/21   Bacigalupo, [name], MD  fluticasone (FLONASE) 50 MCG/ACT nasal spray Place 2 sprays into both nostrils daily. 09/01/22   Bacigalupo, [name], MD  Insulin Pen Needle (BD PEN NEEDLE NANO 2ND GEN) 32G X 4 MM MISC 1 Package by Does not apply route 2 (two) times daily. 11/06/21   Langston Reusing, MD  levocetirizine (XYZAL) 5 MG tablet TAKE 1 TABLET BY MOUTH ONCE DAILY IN THE EVENING 11/09/22   Erasmo Downer, MD  MAGNESIUM GLYCINATE PO Take 240 mg by mouth.    [provider]  MULTIPLE VITAMIN PO Take 1 tablet by mouth daily. 02/27/10   [provider]  Omega-3 Fatty Acids (FISH OIL) 1200 MG CAPS Take 2 capsules (2,400 mg total) by mouth daily. 08/26/21   Erasmo Downer, MD  scopolamine (TRANSDERM-SCOP) 1 MG/3DAYS Place 1 patch (1.5 mg total) onto the skin every 3 (three) days. 09/01/22   Erasmo Downer, MD  Semaglutide,0.25 or 0.5MG /DOS, (OZEMPIC, 0.25 OR 0.5 MG/DOSE,) 2 MG/3ML SOPN Inject 0.5 mg into the skin once a week. 10/05/22   Langston Reusing, MD    Allergies as of 10/27/2022 - Review Complete 10/27/2022  Allergen Reaction Noted   Codeine  02/14/2015   Lisinopril Cough 02/14/2015   Morphine and codeine Itching 06/24/2021    Family History  Problem Relation Age of Onset   Diabetes Mother    Hyperlipidemia Mother    COPD Mother    Hypertension Mother    Cancer Father    Congestive Heart Failure Father    Hypertension Father    Diabetes Paternal Grandfather    CVA Maternal Grandmother    Breast cancer Neg Hx     Social History   Socioeconomic History   Marital status: Widowed    Spouse name: Not on file   Number  of children: Not on file   Years of education: Not on file   Highest education level: Some college, no degree  Occupational History   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    Employer: [employer]   Occupation: retired  Tobacco Use   Smoking status: Never   Smokeless tobacco: Never  Vaping Use   Vaping status: Never Used  Substance and Sexual Activity   Alcohol use: Yes    Comment: rare glass of wine,none last 24hrs   Drug use: No   Sexual activity: Not on file  Other Topics Concern   Not on file  Social History Narrative   Not on file   Social Determinants of Health   Financial Resource Strain: Low Risk  (08/28/2022)   Overall Financial Resource Strain (CARDIA)    Difficulty of Paying Living Expenses: Not hard  at all  Food Insecurity: No Food Insecurity (08/28/2022)   Hunger Vital Sign    Worried About Running Out of Food in the Last Year: Never true    Ran Out of Food in the Last Year: Never true  Transportation Needs: No Transportation Needs (08/28/2022)   PRAPARE - Administrator, Civil Service (Medical): No    Lack of Transportation (Non-Medical): No  Physical Activity: Sufficiently Active (08/28/2022)   Exercise Vital Sign    Days of Exercise per Week: 7 days    Minutes of Exercise per Session: 30 min  Stress: No Stress Concern Present (08/28/2022)   [employer] of Occupational Health - Occupational Stress Questionnaire    Feeling of Stress : Not at all  Social Connections: Moderately Integrated (08/28/2022)   Social Connection and Isolation Panel [NHANES]    Frequency of Communication with Friends and Family: More than three times a week    Frequency of Social Gatherings with Friends and Family: More than three times a week    Attends Religious Services: More than 4 times per year    Active Member of [employer] or Organizations: Yes    Attends Banker Meetings: More than 4 times per year    Marital Status: Widowed  Intimate Partner Violence: Not on file    Review of Systems: See HPI, otherwise negative ROS  Physical Exam: There were no vitals taken for this visit. General:   Alert,  pleasant and cooperative in NAD Head:  Normocephalic and atraumatic. Neck:  Supple; no masses or thyromegaly. Lungs:  Clear throughout to auscultation.    Heart:  Regular rate and rhythm. Abdomen:  Soft, nontender and nondistended. Normal bowel sounds, without guarding, and without rebound.   Neurologic:  Alert and  oriented x4;  grossly normal neurologically.  Impression/Plan: Krista Cooper is now here to undergo a screening colonoscopy.  Risks, benefits, and alternatives regarding colonoscopy have been reviewed with the patient.  Questions have been answered.  All  parties agreeable.

## 2022-12-22 NOTE — Anesthesia Postprocedure Evaluation (Signed)
Anesthesia Post Note  Patient: Krista Cooper  Procedure(s) Performed: COLONOSCOPY WITH PROPOFOL POLYPECTOMY  Patient location during evaluation: Endoscopy Anesthesia Type: General Level of consciousness: awake and alert Pain management: pain level controlled Vital Signs Assessment: post-procedure vital signs reviewed and stable Respiratory status: spontaneous breathing, nonlabored ventilation, respiratory function stable and patient connected to nasal cannula oxygen Cardiovascular status: blood pressure returned to baseline and stable Postop Assessment: no apparent nausea or vomiting Anesthetic complications: no   No notable events documented.   Last Vitals:  Vitals:   12/22/22 1026 12/22/22 1042  BP:  130/70  Pulse:    Resp: 15 16  Temp:    SpO2:  99%    Last Pain:  Vitals:   12/22/22 1042  TempSrc: Temporal  PainSc: 0-No pain                 Cleda Mccreedy Fady Stamps

## 2022-12-23 ENCOUNTER — Encounter: Payer: Self-pay | Admitting: Gastroenterology

## 2022-12-24 ENCOUNTER — Encounter: Payer: Self-pay | Admitting: Gastroenterology

## 2022-12-28 ENCOUNTER — Ambulatory Visit
Admission: RE | Admit: 2022-12-28 | Discharge: 2022-12-28 | Disposition: A | Discharge status: Home / Self Care | Payer: Medicare Other | Source: Ambulatory Visit | Attending: Family Medicine | Admitting: Family Medicine

## 2022-12-28 DIAGNOSIS — Z1231 Encounter for screening mammogram for malignant neoplasm of breast: Secondary | ICD-10-CM | POA: Diagnosis not present

## 2022-12-29 ENCOUNTER — Other Ambulatory Visit: Payer: Self-pay | Admitting: Family Medicine

## 2022-12-29 ENCOUNTER — Ambulatory Visit (INDEPENDENT_AMBULATORY_CARE_PROVIDER_SITE_OTHER): Payer: Medicare Other | Admitting: Family Medicine

## 2022-12-29 ENCOUNTER — Encounter (INDEPENDENT_AMBULATORY_CARE_PROVIDER_SITE_OTHER): Payer: Self-pay | Admitting: Family Medicine

## 2022-12-29 VITALS — BP 134/75 | HR 89 | Temp 98.1°F | Ht 66.0 in | Wt 195.0 lb

## 2022-12-29 DIAGNOSIS — E1165 Type 2 diabetes mellitus with hyperglycemia: Secondary | ICD-10-CM

## 2022-12-29 DIAGNOSIS — I152 Hypertension secondary to endocrine disorders: Secondary | ICD-10-CM

## 2022-12-29 DIAGNOSIS — E669 Obesity, unspecified: Secondary | ICD-10-CM | POA: Diagnosis not present

## 2022-12-29 DIAGNOSIS — Z6831 Body mass index (BMI) 31.0-31.9, adult: Secondary | ICD-10-CM

## 2022-12-29 DIAGNOSIS — E1159 Type 2 diabetes mellitus with other circulatory complications: Secondary | ICD-10-CM

## 2022-12-29 DIAGNOSIS — Z7985 Long-term (current) use of injectable non-insulin antidiabetic drugs: Secondary | ICD-10-CM

## 2022-12-29 MED ORDER — OZEMPIC (0.25 OR 0.5 MG/DOSE) 2 MG/3ML ~~LOC~~ SOPN
0.5000 mg | PEN_INJECTOR | SUBCUTANEOUS | 0 refills | Status: DC
Start: 2022-12-29 — End: 2023-03-24

## 2022-12-29 NOTE — Progress Notes (Signed)
Chief Complaint:   OBESITY Krista Cooper is here to discuss her progress with her obesity treatment plan along with follow-up of her obesity related diagnoses. Krista Cooper is on the Category 2 Plan and states she is following her eating plan approximately 85% of the time. Krista Cooper states she is exercising for 30-40 minutes 4-5 times per week.  Today's visit was #: 21 Starting weight: 202 lbs Starting date: 06/24/2021 Today's weight: 195 lbs Today's date: 12/29/2022 Total lbs lost to date: 7 Total lbs lost since last in-office visit: 0  Interim History: Patient voices her trip was enjoyable but the trip getting back was very stressful due to travel delays.  She got COVID and went on Paxlovid.  She was able to get some of the protein supplements that was given to her from this office.  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  The two polyps in her colon that were removed pathology was negative.  She had a mammogram yesterday. She celebrated her birthday and a birthday party for a friend.  She has gotten back to more consistent exercise.  Patient has plans to continue current meal plan and wants to incorporate more salmon and seafood into her diet.  Subjective:   1. Type 2 diabetes mellitus with hyperglycemia, without long-term current use of insulin (HCC) Patient is on Ozempic 0.5 mg subcu weekly.  She denies GI side effects.  2. Hypertension associated with diabetes [facility]) Patient is on Maxzide-25.  Her blood pressure is controlled, and she denies chest pain, chest pressure, or headache.  Assessment/Plan:   1. Type 2 diabetes mellitus with hyperglycemia, without long-term current use of insulin (HCC) Patient will continue Ozempic 0.5 mg subcu weekly, and we will refill for 90 days.  - Semaglutide,0.25 or 0.5MG /DOS, (OZEMPIC, 0.25 OR 0.5 MG/DOSE,) 2 MG/3ML SOPN; Inject 0.5 mg into the skin once a week.  Dispense: 9 mL; Refill: 0  2. Hypertension associated  with diabetes [facility]) Patient will continue her current medications with no change in dose.  3. BMI 31.0-31.9,adult  4. Obesity with starting BMI of 32.6 Krista Cooper is currently in the action stage of change. As such, her goal is to continue with weight loss efforts. She has agreed to the Category 2 Plan.   Exercise goals: As is.   Behavioral modification strategies: increasing lean protein intake, meal planning and cooking strategies, keeping healthy foods in the home, and planning for success.  Krista Cooper has agreed to follow-up with our clinic in 3 to 4 weeks. She was informed of the importance of frequent follow-up visits to maximize her success with intensive lifestyle modifications for her multiple health conditions.   Objective:   Blood pressure 134/75, pulse 89, temperature 98.1 F (36.7 C), height 5\' 6"  (1.676 m), weight 195 lb (88.5 kg), SpO2 98%. Body mass index is 31.47 kg/m.  General: Cooperative, alert, well developed, in no acute distress. HEENT: Conjunctivae and lids unremarkable. Cardiovascular: Regular rhythm.  Lungs: Normal work of breathing. Neurologic: No focal deficits.   Lab Results  Component Value Date   CREATININE 0.70 09/04/2022   BUN 22 09/04/2022   NA 134 09/04/2022   K 4.3 09/04/2022   CL 96 09/04/2022   CO2 22 09/04/2022   Lab Results  Component Value Date   ALT 30 09/04/2022   AST 32 09/04/2022   ALKPHOS 71 09/04/2022   BILITOT 0.7 09/04/2022   Lab Results  Component Value Date   HGBA1C 5.9 (  A) 09/01/2022   HGBA1C 6.3 (H) 03/04/2022   HGBA1C 6.4 (H) 10/14/2021   HGBA1C 6.5 (H) 06/24/2021   HGBA1C 6.3 (H) 02/20/2021   Lab Results  Component Value Date   INSULIN 9.0 10/14/2021   INSULIN 11.3 06/24/2021   Lab Results  Component Value Date   TSH 2.120 06/24/2021   Lab Results  Component Value Date   CHOL 189 09/04/2022   HDL 60 09/04/2022   LDLCALC 115 (H) 09/04/2022   TRIG 78 09/04/2022   CHOLHDL 3.2 09/04/2022   Lab Results   Component Value Date   VD25OH 48.6 03/04/2022   VD25OH 58.7 10/14/2021   VD25OH 39.9 06/24/2021   Lab Results  Component Value Date   WBC 4.9 06/24/2021   HGB 13.6 06/24/2021   HCT 41.5 06/24/2021   MCV 88 06/24/2021   PLT 373 06/24/2021   No results found for: "IRON", "TIBC", "FERRITIN"  Attestation Statements:   Reviewed by clinician on day of visit: allergies, medications, problem list, medical history, surgical history, family history, social history, and previous encounter notes.   I, Burt Knack, am acting as transcriptionist for Reuben Likes, MD.  I have reviewed the above documentation for accuracy and completeness, and I agree with the above. - Reuben Likes, MD

## 2023-01-20 ENCOUNTER — Ambulatory Visit (INDEPENDENT_AMBULATORY_CARE_PROVIDER_SITE_OTHER): Payer: Medicare Other

## 2023-01-20 VITALS — Ht 69.0 in | Wt 195.0 lb

## 2023-01-20 DIAGNOSIS — Z0000 Encounter for general adult medical examination without abnormal findings: Secondary | ICD-10-CM | POA: Diagnosis not present

## 2023-01-20 NOTE — Patient Instructions (Signed)
Krista Cooper , Thank you for taking time to come for your Medicare Wellness Visit. I appreciate your ongoing commitment to your health goals. Please review the following plan we discussed and let me know if I can assist you in the future.   Referrals/Orders/Follow-Ups/Clinician Recommendations: none  This is a list of the screening recommended for you and due dates:  Health Maintenance  Topic Date Due   Flu Shot  12/17/2022   COVID-19 Vaccine (7 - 2023-24 season) 01/17/2023   Hemoglobin A1C  03/03/2023   Eye exam for diabetics  03/28/2023   Complete foot exam   09/01/2023   Yearly kidney function blood test for diabetes  09/04/2023   Yearly kidney health urinalysis for diabetes  09/04/2023   Medicare Annual Wellness Visit  01/20/2024   Mammogram  12/27/2024   DTaP/Tdap/Td vaccine (3 - Td or Tdap) 03/19/2026   Colon Cancer Screening  12/22/2027   Pneumonia Vaccine  Completed   DEXA scan (bone density measurement)  Completed   Hepatitis C Screening  Completed   Zoster (Shingles) Vaccine  Completed   HPV Vaccine  Aged Out   Cologuard (Stool DNA test)  Discontinued    Advanced directives: (Declined) Advance directive discussed with you today. Even though you declined this today, please call our office should you change your mind, and we can give you the proper paperwork for you to fill out.  Next Medicare Annual Wellness Visit scheduled for next year: Yes 01/25/23 @ 11:00 telephone

## 2023-01-20 NOTE — Progress Notes (Addendum)
Subjective:   Krista Cooper is a 67 y.o. female who presents for Medicare Annual (Subsequent) preventive examination.  Visit Complete: Virtual  I connected with  Krista Cooper on 01/20/23 by a audio enabled telemedicine application and verified that I am speaking with the correct person using two identifiers.  Patient Location: Home  Provider Location: Office/Clinic  I discussed the limitations of evaluation and management by telemedicine. The patient expressed understanding and agreed to proceed.  Vital Signs: Unable to obtain new vitals due to this being a telehealth visit.  Patient Medicare AWV questionnaire was completed by the patient on 01/16/23; I have confirmed that all information answered by patient is correct and no changes since this date.  Review of Systems    Cardiac Risk Factors include: advanced age (>16men, >100 women);hypertension;diabetes mellitus;dyslipidemia    Objective:    Today's Vitals   01/20/23 1001  Weight: 195 lb (88.5 kg)  Height: 5\' 9"  (1.753 m)   Body mass index is 28.8 kg/m.     01/20/2023   10:11 AM 12/22/2022    8:47 AM 08/05/2015    2:54 PM 02/15/2015    8:23 AM  Advanced Directives  Does Patient Have a Medical Advance Directive? No No No No  Type of Diplomatic Services operational officer;Living will     Would patient like information on creating a medical advance directive?  No - Patient declined      Current Medications (verified) Outpatient Encounter Medications as of 01/20/2023  Medication Sig   ALPRAZolam (XANAX) 0.25 MG tablet Take 1 tablet (0.25 mg total) by mouth 2 (two) times daily as needed for anxiety.   Cholecalciferol (VITAMIN D3) 125 MCG (5000 UT) CAPS Take 1 capsule (5,000 Units total) by mouth daily.   Coenzyme Q10 (COQ10) 200 MG CAPS Take 200 mg by mouth daily with breakfast.   fluticasone (FLONASE) 50 MCG/ACT nasal spray Place 2 sprays into both nostrils daily.   Insulin Pen Needle (BD PEN NEEDLE NANO 2ND  GEN) 32G X 4 MM MISC 1 Package by Does not apply route 2 (two) times daily.   levocetirizine (XYZAL) 5 MG tablet TAKE 1 TABLET BY MOUTH ONCE DAILY IN THE EVENING   MAGNESIUM GLYCINATE PO Take 240 mg by mouth.   MULTIPLE VITAMIN PO Take 1 tablet by mouth daily.   Omega-3 Fatty Acids (FISH OIL) 1200 MG CAPS Take 2 capsules (2,400 mg total) by mouth daily.   rosuvastatin (CRESTOR) 10 MG tablet Take 1 tablet (10 mg total) by mouth daily.   scopolamine (TRANSDERM-SCOP) 1 MG/3DAYS Place 1 patch (1.5 mg total) onto the skin every 3 (three) days.   Semaglutide,0.25 or 0.5MG /DOS, (OZEMPIC, 0.25 OR 0.5 MG/DOSE,) 2 MG/3ML SOPN Inject 0.5 mg into the skin once a week.   sertraline (ZOLOFT) 50 MG tablet Take 1 tablet by mouth once daily   triamterene-hydrochlorothiazide (MAXZIDE-25) 37.5-25 MG tablet Take 1 tablet by mouth once daily   No facility-administered encounter medications on file as of 01/20/2023.    Allergies (verified) Codeine, Lisinopril, and Morphine and codeine   History: Past Medical History:  Diagnosis Date   Allergy    Anxiety    Diabetes mellitus without complication (HCC)    Hyperlipidemia    Hypertension    Obesity    Prediabetes    Seasonal allergies    Past Surgical History:  Procedure Laterality Date   CESAREAN SECTION     COLONOSCOPY     COLONOSCOPY WITH PROPOFOL N/A 12/22/2022  Procedure: COLONOSCOPY WITH PROPOFOL;  Surgeon: Midge Minium, MD;  Location: [facility] ENDOSCOPY;  Service: Endoscopy;  Laterality: N/A;   FRACTURE SURGERY Right 05/19/1995   PLATES AND PINS   MOUTH SURGERY  05/18/1974   WISDOM TEETH REMOVED   POLYPECTOMY  12/22/2022   Procedure: POLYPECTOMY;  Surgeon: Midge Minium, MD;  Location: [facility] ENDOSCOPY;  Service: Endoscopy;;   Family History  Problem Relation Age of Onset   Diabetes Mother    Hyperlipidemia Mother    COPD Mother    Hypertension Mother    Obesity Mother    Cancer Father    Congestive Heart Failure Father    Hypertension Father     Obesity Father    Diabetes Paternal Grandfather    CVA Maternal Grandmother    Stroke Maternal Grandmother    Arthritis Paternal Aunt    Breast cancer Neg Hx    Social History   Socioeconomic History   Marital status: Widowed    Spouse name: Not on file   Number of children: Not on file   Years of education: Not on file   Highest education level: Some college, no degree  Occupational History   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   Drug use: Never   Sexual activity: Not Currently    Birth control/protection: None  Other Topics Concern   Not on file  Social History Narrative   Not on file   Social Determinants of Health   Financial Resource Strain: Low Risk  (01/16/2023)   Overall Financial Resource Strain (CARDIA)    Difficulty of Paying Living Expenses: Not hard at all  Food Insecurity: No Food Insecurity (01/16/2023)   Hunger Vital Sign    Worried About Running Out of Food in the Last Year: Never true    Ran Out of Food in the Last Year: Never true  Transportation Needs: No Transportation Needs (01/16/2023)   PRAPARE - Administrator, Civil Service (Medical): No    Lack of Transportation (Non-Medical): No  Physical Activity: Sufficiently Active (01/16/2023)   Exercise Vital Sign    Days of Exercise per Week: 5 days    Minutes of Exercise per Session: 30 min  Stress: No Stress Concern Present (01/16/2023)   [employer] of Occupational Health - Occupational Stress Questionnaire    Feeling of Stress : Not at all  Social Connections: Moderately Integrated (01/16/2023)   Social Connection and Isolation Panel [NHANES]    Frequency of Communication with Friends and Family: More than  three times a week    Frequency of Social Gatherings with Friends and Family: More than three times a week    Attends Religious Services: More than 4 times per year    Active Member of [employer] or Organizations: Yes    Attends Banker Meetings: More than 4 times per year    Marital Status: Widowed    Tobacco Counseling Counseling given: Not Answered   Clinical Intake:  Pre-visit preparation completed: Yes  Pain : No/denies pain     BMI - recorded: 28.8 Nutritional Status: BMI 25 -29 Overweight Nutritional Risks: None Diabetes: No  How often do you need to  have someone help you when you read instructions, pamphlets, or other written materials from your doctor or pharmacy?: 1 - Never  Interpreter Needed?: No  Comments: lives alone Information entered by :: B.Damion Kant,LPN   Activities of Daily Living    01/16/2023    9:24 AM 03/10/2022   10:25 AM  In your present state of health, do you have any difficulty performing the following activities:  Hearing? 0 0  Vision? 0 0  Difficulty concentrating or making decisions? 0 0  Walking or climbing stairs? 0 0  Dressing or bathing? 0 0  Doing errands, shopping? 0 0  Preparing Food and eating ? N   Using the Toilet? N   In the past six months, have you accidently leaked urine? N   Do you have problems with loss of bowel control? N   Managing your Medications? N   Managing your Finances? N   Housekeeping or managing your Housekeeping? N     Patient Care Team: Erasmo Downer, MD as PCP - General (Family Medicine)  Indicate any recent Medical Services you may have received from other than [facility] providers in the past year (date may be approximate).     Assessment:   This is a routine wellness examination for [place].  Hearing/Vision screen Hearing Screening - Comments:: Rt ear with buzzing and less hearing:ENT appt Monday Vision Screening - Comments:: Adequate vision w/glasses Dr Druscilla Brownie  Dietary  issues and exercise activities discussed:     Goals Addressed             This Visit's Progress    Patient Stated       I would like to lose 10lbs by next year       Depression Screen    01/20/2023   10:10 AM 03/10/2022   10:26 AM 03/10/2022   10:25 AM 08/26/2021   11:05 AM 06/24/2021    8:44 AM 03/07/2021   10:00 AM 12/23/2020    2:15 PM  PHQ 2/9 Scores  PHQ - 2 Score 0 0 0 0 1 0 0  PHQ- 9 Score  0 0 0 3 1 1     Fall Risk    01/16/2023    9:24 AM 03/10/2022   10:24 AM 03/09/2022   10:45 AM 03/06/2022    8:54 AM 08/26/2021   11:05 AM  Fall Risk   Falls in the past year? 0 0 0 0 0  Number falls in past yr:  0     Injury with Fall?   0 0   Risk for fall due to : No Fall Risks No Fall Risks     Follow up Falls prevention discussed;Education provided Falls evaluation completed       MEDICARE RISK AT HOME: Medicare Risk at Home Any stairs in or around the home?: Yes If so, are there any without handrails?: No Home free of loose throw rugs in walkways, pet beds, electrical cords, etc?: Yes Adequate lighting in your home to reduce risk of falls?: Yes Life alert?: No Use of a cane, walker or w/c?: No Grab bars in the bathroom?: Yes Shower chair or bench in shower?: Yes Elevated toilet seat or a handicapped toilet?: No  TIMED UP AND GO:  Was the test performed?  No    Cognitive Function:        01/20/2023   10:12 AM  6CIT Screen  What Year? 0 points  What month? 0 points  What time? 0 points  Count back  from 20 0 points  Months in reverse 0 points  Repeat phrase 0 points  Total Score 0 points    Immunizations Immunization History  Administered Date(s) Administered   Fluad Quad(high Dose 65+) 03/07/2021, 03/10/2022   Influenza,inj,Quad PF,6+ Mos 02/02/2014, 02/15/2015, 03/19/2016, 03/28/2018, 04/03/2019, 04/03/2020   Influenza-Unspecified 03/16/2017   PFIZER Comirnaty(Gray Top)Covid-19 Tri-Sucrose Vaccine 05/27/2020, 09/27/2020, 02/10/2021, 02/16/2022    PFIZER(Purple Top)SARS-COV-2 Vaccination 11/06/2019, 11/27/2019   PNEUMOCOCCAL CONJUGATE-20 12/23/2020   Pfizer Covid-19 Vaccine Bivalent Booster 64yrs & up 02/16/2022   Td 03/19/2016   Tdap 10/15/2005   Zoster Recombinant(Shingrix) 09/30/2018, 12/12/2018    TDAP status: Up to date  Flu Vaccine status: Up to date  Pneumococcal vaccine status: Up to date  Covid-19 vaccine status: Completed vaccines  Qualifies for Shingles Vaccine? Yes   Zostavax completed Yes   Shingrix Completed?: Yes  Screening Tests Health Maintenance  Topic Date Due   INFLUENZA VACCINE  12/17/2022   COVID-19 Vaccine (7 - 2023-24 season) 01/17/2023   HEMOGLOBIN A1C  03/03/2023   OPHTHALMOLOGY EXAM  03/28/2023   FOOT EXAM  09/01/2023   Diabetic kidney evaluation - eGFR measurement  09/04/2023   Diabetic kidney evaluation - Urine ACR  09/04/2023   Medicare Annual Wellness (AWV)  01/20/2024   MAMMOGRAM  12/27/2024   DTaP/Tdap/Td (3 - Td or Tdap) 03/19/2026   Colonoscopy  12/22/2027   Pneumonia Vaccine 67+ Years old  Completed   DEXA SCAN  Completed   Hepatitis C Screening  Completed   Zoster Vaccines- Shingrix  Completed   HPV VACCINES  Aged Out   Fecal DNA (Cologuard)  Discontinued    Health Maintenance  Health Maintenance Due  Topic Date Due   INFLUENZA VACCINE  12/17/2022   COVID-19 Vaccine (7 - 2023-24 season) 01/17/2023    Colorectal cancer screening: Type of screening: Colonoscopy. Completed yes. Repeat every 5-10 years  Mammogram status: Completed yes. Repeat every year  Bone Density status: Completed yes. Results reflect: Bone density results: NORMAL. Repeat every 5 years.  Lung Cancer Screening: (Low Dose CT Chest recommended if Age 42-80 years, 20 pack-year currently smoking OR have quit w/in 15years.) does not qualify.   Lung Cancer Screening Referral: no  Additional Screening:  Hepatitis C Screening: does not qualify; Completed yes  Vision Screening: Recommended annual  ophthalmology exams for early detection of glaucoma and other disorders of the eye. Is the patient up to date with their annual eye exam?  Yes  Who is the provider or what is the name of the office in which the patient attends annual eye exams? Dr Druscilla Brownie If pt is not established with a provider, would they like to be referred to a provider to establish care? No .   Dental Screening: Recommended annual dental exams for proper oral hygiene  Diabetic Foot Exam: n/a  Community Resource Referral / Chronic Care Management: CRR required this visit?  No   CCM required this visit?  No    Plan:     I have personally reviewed and noted the following in the patient's chart:   Medical and social history Use of alcohol, tobacco or illicit drugs  Current medications and supplements including opioid prescriptions. Patient is not currently taking opioid prescriptions. Functional ability and status Nutritional status Physical activity Advanced directives List of other physicians Hospitalizations, surgeries, and ER visits in previous 12 months Vitals Screenings to include cognitive, depression, and falls Referrals and appointments  In addition, I have reviewed and discussed with patient certain preventive  protocols, quality metrics, and best practice recommendations. A written personalized care plan for preventive services as well as general preventive health recommendations were provided to patient.     Sue Lush, LPN   05/23/1094   After Visit Summary: (MyChart) Due to this being a telephonic visit, the after visit summary with patients personalized plan was offered to patient via MyChart   Nurse Notes: The patient states she is doing well and has no concerns or questions at this time.  *Pt indicates she received a Covid booster on 10/14/22. Placed historically in pt's record.

## 2023-01-25 DIAGNOSIS — H9313 Tinnitus, bilateral: Secondary | ICD-10-CM | POA: Diagnosis not present

## 2023-01-25 DIAGNOSIS — H90A21 Sensorineural hearing loss, unilateral, right ear, with restricted hearing on the contralateral side: Secondary | ICD-10-CM | POA: Diagnosis not present

## 2023-01-25 NOTE — Progress Notes (Signed)
I have reviewed the health advisor's note, was available for consultation, and agree with documentation and plan  Eliazer Hemphill, [name], MD, MPH [facility] 01/25/2023 8:11 AM

## 2023-01-27 ENCOUNTER — Encounter (INDEPENDENT_AMBULATORY_CARE_PROVIDER_SITE_OTHER): Payer: Self-pay | Admitting: Physician Assistant

## 2023-01-27 ENCOUNTER — Ambulatory Visit (INDEPENDENT_AMBULATORY_CARE_PROVIDER_SITE_OTHER): Payer: Medicare Other | Admitting: Physician Assistant

## 2023-01-27 VITALS — BP 133/73 | HR 94 | Temp 98.0°F | Ht 66.0 in | Wt 197.0 lb

## 2023-01-27 DIAGNOSIS — E1169 Type 2 diabetes mellitus with other specified complication: Secondary | ICD-10-CM | POA: Diagnosis not present

## 2023-01-27 DIAGNOSIS — I152 Hypertension secondary to endocrine disorders: Secondary | ICD-10-CM

## 2023-01-27 DIAGNOSIS — E785 Hyperlipidemia, unspecified: Secondary | ICD-10-CM

## 2023-01-27 DIAGNOSIS — E669 Obesity, unspecified: Secondary | ICD-10-CM | POA: Diagnosis not present

## 2023-01-27 DIAGNOSIS — K5901 Slow transit constipation: Secondary | ICD-10-CM

## 2023-01-27 DIAGNOSIS — K5900 Constipation, unspecified: Secondary | ICD-10-CM

## 2023-01-27 DIAGNOSIS — E1165 Type 2 diabetes mellitus with hyperglycemia: Secondary | ICD-10-CM

## 2023-01-27 DIAGNOSIS — Z6832 Body mass index (BMI) 32.0-32.9, adult: Secondary | ICD-10-CM

## 2023-01-27 DIAGNOSIS — Z6831 Body mass index (BMI) 31.0-31.9, adult: Secondary | ICD-10-CM

## 2023-01-27 DIAGNOSIS — U071 COVID-19: Secondary | ICD-10-CM

## 2023-01-27 DIAGNOSIS — Z7985 Long-term (current) use of injectable non-insulin antidiabetic drugs: Secondary | ICD-10-CM

## 2023-01-27 NOTE — Progress Notes (Signed)
.smr  Office: [phone]  /  Fax: [phone]  WEIGHT SUMMARY AND BIOMETRICS  Vitals Temp: 98 F (36.7 C) BP: 133/73 Pulse Rate: 94 SpO2: 98 %   Anthropometric Measurements Height: 5\' 6"  (1.676 m) Weight: 197 lb (89.4 kg) BMI (Calculated): 31.81 Weight at Last Visit: 195 lb Weight Lost Since Last Visit: 0 Weight Gained Since Last Visit: 2lb Starting Weight: 202 lb Total Weight Loss (lbs): 5 lb (2.268 kg)   Body Composition  Body Fat %: 41.8 % Fat Mass (lbs): 82.4 lbs Muscle Mass (lbs): 109 lbs Total Body Water (lbs): 74.6 lbs Visceral Fat Rating : 12   Other Clinical Data Today's Visit #: 22 Starting Date: 06/24/21     HPI  Chief Complaint: OBESITY  Krista Cooper is here to discuss her progress with her obesity treatment plan. She is on the the Category 2 Plan and states she is following her eating plan approximately 90 % of the time. She states she is exercising walk/yoga/Barre 30/60/90 minutes 3/3/4 times per week.  Discussed the use of AI scribe software for clinical note transcription with the patient, who gave verbal consent to proceed.  History of Present Illness /    Interval History:  Since last office visit she up 2 lbs.      The patient, a 67 year old female with a history of obesity and type 2 diabetes, presents for a follow-up visit. She reports adherence to her treatment plan, including diet and exercise, and notes an overall improvement in her well-being. She has recently started on prednisone for an ear issue, possibly related to a recent COVID infection. The ear issue has affected her hearing, but she reports some improvement with popping sensations in the ear.  The patient has been maintaining her muscle mass and has seen a slight increase in adipose tissue, possibly due to the prednisone. She has also been on Ozempic for her diabetes since the early part of the year. She reports constipation, which she manages with an over-the-counter stool  softener. She has a history of diverticular disease and has been advised to increase her fiber intake.  The patient also reports satisfaction with her current diet plan, noting that she feels satiated after meals and does not have excessive cravings. She occasionally indulges in a treat but tries to stay within her calorie limits. She also reports drinking about four 20-ounce bottles of water a day.    Pharmacotherapy: Ozempic 0.5 mg weekly. Denies mass in neck, dysphagia, dyspepsia, persistent hoarseness, abdominal pain, or N/V/or diarrhea. Has annual eye exam. Mood is stable. Constipation managed with colace and increasing fiber intake.    TREATMENT PLAN FOR OBESITY:  Recommended Dietary Goals  Krista Cooper is currently in the action stage of change. As such, her goal is to continue weight management plan. She has agreed to the Category 2 Plan.  Behavioral Intervention  We discussed the following Behavioral Modification Strategies today: increasing lean protein intake, decreasing simple carbohydrates , increasing vegetables, increasing lower glycemic fruits, increasing fiber rich foods, avoiding skipping meals, increasing water intake, continue to practice mindfulness when eating, and planning for success.  Additional resources provided today: NA  Recommended Physical Activity Goals  Krista Cooper has been advised to work up to 150 minutes of moderate intensity aerobic activity a week and strengthening exercises 2-3 times per week for cardiovascular health, weight loss maintenance and preservation of muscle mass.   She has agreed to Continue current level of physical activity    Pharmacotherapy We discussed various medication options  to help Krista Cooper with her weight loss efforts and we both agreed to continue Ozempic 0.5 mg weekly.    Return in about 4 weeks (around 02/24/2023).Marland Kitchen She was informed of the importance of frequent follow up visits to maximize her success with intensive lifestyle  modifications for her multiple health conditions.  PHYSICAL EXAM:  Blood pressure 133/73, pulse 94, temperature 98 F (36.7 C), height 5\' 6"  (1.676 m), weight 197 lb (89.4 kg), SpO2 98%. Body mass index is 31.8 kg/m.  General: She is overweight, cooperative, alert, well developed, and in no acute distress. PSYCH: Has normal mood, affect and thought process.   Lungs: Normal breathing effort, no conversational dyspnea.  DIAGNOSTIC DATA REVIEWED:  BMET    Component Value Date/Time   NA 134 09/04/2022 0815   K 4.3 09/04/2022 0815   CL 96 09/04/2022 0815   CO2 22 09/04/2022 0815   GLUCOSE 103 (H) 09/04/2022 0815   BUN 22 09/04/2022 0815   CREATININE 0.70 09/04/2022 0815   CALCIUM 9.6 09/04/2022 0815   GFRNONAA 98 10/13/2019 1003   GFRAA 113 10/13/2019 1003   Lab Results  Component Value Date   HGBA1C 5.9 (A) 09/01/2022   HGBA1C 6.3 (A) 08/22/2014   Lab Results  Component Value Date   INSULIN 9.0 10/14/2021   INSULIN 11.3 06/24/2021   Lab Results  Component Value Date   TSH 2.120 06/24/2021   CBC    Component Value Date/Time   WBC 4.9 06/24/2021 1011   RBC 4.71 06/24/2021 1011   HGB 13.6 06/24/2021 1011   HCT 41.5 06/24/2021 1011   PLT 373 06/24/2021 1011   MCV 88 06/24/2021 1011   MCH 28.9 06/24/2021 1011   MCHC 32.8 06/24/2021 1011   RDW 13.1 06/24/2021 1011   Iron Studies No results found for: "IRON", "TIBC", "FERRITIN", "IRONPCTSAT" Lipid Panel     Component Value Date/Time   CHOL 189 09/04/2022 0815   TRIG 78 09/04/2022 0815   HDL 60 09/04/2022 0815   CHOLHDL 3.2 09/04/2022 0815   LDLCALC 115 (H) 09/04/2022 0815   Hepatic Function Panel     Component Value Date/Time   PROT 6.7 09/04/2022 0815   ALBUMIN 4.5 09/04/2022 0815   AST 32 09/04/2022 0815   ALT 30 09/04/2022 0815   ALKPHOS 71 09/04/2022 0815   BILITOT 0.7 09/04/2022 0815      Component Value Date/Time   TSH 2.120 06/24/2021 1011   Nutritional Lab Results  Component Value Date    VD25OH 48.6 03/04/2022   VD25OH 58.7 10/14/2021   VD25OH 39.9 06/24/2021    ASSOCIATED CONDITIONS ADDRESSED TODAY  ASSESSMENT AND PLAN  Problem List Items Addressed This Visit     Hyperlipidemia associated with type 2 diabetes mellitus (HCC)   Hypertension associated with diabetes (HCC)   T2DM (type 2 diabetes mellitus) (HCC) - Primary   Class 1 obesity with serious comorbidity and body mass index (BMI) of 32.0 to 32.9 in adult   BMI 31.0-31.9,adult Current BMI 31.8  Obesity and Type 2 Diabetes Patient is adhering to the diet and exercise plan. She is currently on 0.5mg  Ozempic. She has maintained muscle mass and her BMI is improving. She is experiencing constipation, possibly due to the diet and medication. -Continue Ozempic 0.5mg . -Start Psyllium husk capsules for constipation, starting with 2 capsules at bedtime and can increase up to 4 a day if needed. -Check blood sugars if symptoms of hypoglycemia occur. -Continue diet and exercise regimen. -Check A1c at next appointment  with Dr. Leonard Schwartz.  Constipation/Diverticular Disease Patient has a history of diverticular disease and recent constipation. -Start Psyllium husk capsules to increase fiber intake and improve bowel movements.  Ear Infection/ Recent Covid infection:  Patient is currently on prednisone for an ear infection, possibly related to a recent COVID infection or allergies. This has caused some water weight gain. -Continue prednisone as prescribed by the other doctor. -Monitor for improvement in ear symptoms.  Follow-up Next appointment scheduled for October 7th, 2024 at 12 noon.  ATTESTASTION STATEMENTS:  Reviewed by clinician on day of visit: allergies, medications, problem list, medical history, surgical history, family history, social history, and previous encounter notes.   I have personally spent 40 minutes total time today in preparation, patient care, nutritional counseling and documentation for this visit,  including the following: review of clinical lab tests; review of medical tests/procedures/services.      Krista Stanfield, PA-C

## 2023-02-02 ENCOUNTER — Ambulatory Visit (INDEPENDENT_AMBULATORY_CARE_PROVIDER_SITE_OTHER): Payer: Medicare Other | Admitting: Physician Assistant

## 2023-02-16 ENCOUNTER — Other Ambulatory Visit: Payer: Self-pay | Admitting: Family Medicine

## 2023-02-16 NOTE — Telephone Encounter (Signed)
Requested Prescriptions  Pending Prescriptions Disp Refills   sertraline (ZOLOFT) 50 MG tablet [Pharmacy Med Name: Sertraline HCl 50 MG Oral Tablet] 90 tablet 0    Sig: Take 1 tablet by mouth once daily     Psychiatry:  Antidepressants - SSRI - sertraline Passed - 02/16/2023  6:40 AM      Passed - AST in normal range and within 360 days    AST  Date Value Ref Range Status  09/04/2022 32 0 - 40 IU/L Final         Passed - ALT in normal range and within 360 days    ALT  Date Value Ref Range Status  09/04/2022 30 0 - 32 IU/L Final         Passed - Completed PHQ-2 or PHQ-9 in the last 360 days      Passed - Valid encounter within last 6 months    Recent Outpatient Visits           2 months ago COVID   [facility] Merita Norton T, FNP   5 months ago Type 2 diabetes mellitus with hyperglycemia, without long-term current use of insulin [facility])   [place] [facility] [place], [name], MD   6 months ago Acute non-recurrent frontal sinusitis   [facility] Alfredia Ferguson, PA-C   11 months ago Seasonal allergic rhinitis, unspecified trigger   [facility] Caro Laroche, DO   1 year ago Hypertension associated with diabetes [facility])   [facility] Bacigalupo, [name], MD       Future Appointments             In 2 weeks Bacigalupo, [name], MD [facility]

## 2023-02-17 DIAGNOSIS — H90A31 Mixed conductive and sensorineural hearing loss, unilateral, right ear with restricted hearing on the contralateral side: Secondary | ICD-10-CM | POA: Diagnosis not present

## 2023-02-22 ENCOUNTER — Ambulatory Visit (INDEPENDENT_AMBULATORY_CARE_PROVIDER_SITE_OTHER): Payer: Medicare Other | Admitting: Family Medicine

## 2023-02-22 ENCOUNTER — Encounter (INDEPENDENT_AMBULATORY_CARE_PROVIDER_SITE_OTHER): Payer: Self-pay | Admitting: Family Medicine

## 2023-02-22 VITALS — BP 129/74 | HR 79 | Temp 97.7°F | Ht 66.0 in | Wt 197.0 lb

## 2023-02-22 DIAGNOSIS — E785 Hyperlipidemia, unspecified: Secondary | ICD-10-CM

## 2023-02-22 DIAGNOSIS — Z7985 Long-term (current) use of injectable non-insulin antidiabetic drugs: Secondary | ICD-10-CM

## 2023-02-22 DIAGNOSIS — E1165 Type 2 diabetes mellitus with hyperglycemia: Secondary | ICD-10-CM | POA: Diagnosis not present

## 2023-02-22 DIAGNOSIS — E669 Obesity, unspecified: Secondary | ICD-10-CM | POA: Diagnosis not present

## 2023-02-22 DIAGNOSIS — Z6831 Body mass index (BMI) 31.0-31.9, adult: Secondary | ICD-10-CM

## 2023-02-22 DIAGNOSIS — E1169 Type 2 diabetes mellitus with other specified complication: Secondary | ICD-10-CM

## 2023-02-22 DIAGNOSIS — Z6832 Body mass index (BMI) 32.0-32.9, adult: Secondary | ICD-10-CM

## 2023-02-22 NOTE — Progress Notes (Signed)
Chief Complaint:   OBESITY Krista Cooper is here to discuss her progress with her obesity treatment plan along with follow-up of her obesity related diagnoses. Krista Cooper is on the Category 2 Plan and states she is following her eating plan approximately 85% of the time. Krista Cooper states she is walking and using resistance bands for 20-30 minutes 3-4 times per week.  Today's visit was #: 23 Starting weight: 202 lbs Starting date: 06/24/2021 Today's weight: 197 lbs Today's date: 02/22/2023 Total lbs lost to date: 5 Total lbs lost since last in-office visit: 0  Interim History: Patient voices that she has been focusing on meal plan as much as she can.  She is taking psyllium husk capsules with increased water consumption and that has helped significantly with her constipation.  She had grilled steak or shrimp a couple of different times.  She has tried to stay consistent while at the beach.  She often skips lunch while away.  She also had to take a course of steroids for her ear.  She is being worked up for hearing loss.  She also went to a class reunion at [facility].   Subjective:   1. Type 2 diabetes mellitus with hyperglycemia, without long-term current use of insulin (HCC) Patient is on Ozempic weekly.  Her last A1c was 5.9 five months ago.  She notes some side effects of constipation.  2. Hyperlipidemia associated with type 2 diabetes mellitus (HCC) Patient is on Crestor with no myalgias or transaminitis.  Her last labs were done in April.  Assessment/Plan:   1. Type 2 diabetes mellitus with hyperglycemia, without long-term current use of insulin (HCC) Patient will continue Ozempic with no change in medication.  2. Hyperlipidemia associated with type 2 diabetes mellitus (HCC) Patient will continue Crestor with no change in dose.  3. BMI 31.0-31.9,adult  4. Obesity with starting BMI of 32.6 Krista Cooper is currently in the action stage of change. As such, her goal is to  continue with weight loss efforts. She has agreed to the Category 2 Plan.   Exercise goals: As is.   Behavioral modification strategies: increasing lean protein intake, meal planning and cooking strategies, keeping healthy foods in the home, and planning for success.  Krista Cooper has agreed to follow-up with our clinic in 4 weeks. She was informed of the importance of frequent follow-up visits to maximize her success with intensive lifestyle modifications for her multiple health conditions.   Objective:   Blood pressure 129/74, pulse 79, temperature 97.7 F (36.5 C), height 5\' 6"  (1.676 m), weight 197 lb (89.4 kg), SpO2 99%. Body mass index is 31.8 kg/m.  General: Cooperative, alert, well developed, in no acute distress. HEENT: Conjunctivae and lids unremarkable. Cardiovascular: Regular rhythm.  Lungs: Normal work of breathing. Neurologic: No focal deficits.   Lab Results  Component Value Date   CREATININE 0.70 09/04/2022   BUN 22 09/04/2022   NA 134 09/04/2022   K 4.3 09/04/2022   CL 96 09/04/2022   CO2 22 09/04/2022   Lab Results  Component Value Date   ALT 30 09/04/2022   AST 32 09/04/2022   ALKPHOS 71 09/04/2022   BILITOT 0.7 09/04/2022   Lab Results  Component Value Date   HGBA1C 5.9 (A) 09/01/2022   HGBA1C 6.3 (H) 03/04/2022   HGBA1C 6.4 (H) 10/14/2021   HGBA1C 6.5 (H) 06/24/2021   HGBA1C 6.3 (H) 02/20/2021   Lab Results  Component Value Date   INSULIN 9.0 10/14/2021  INSULIN 11.3 06/24/2021   Lab Results  Component Value Date   TSH 2.120 06/24/2021   Lab Results  Component Value Date   CHOL 189 09/04/2022   HDL 60 09/04/2022   LDLCALC 115 (H) 09/04/2022   TRIG 78 09/04/2022   CHOLHDL 3.2 09/04/2022   Lab Results  Component Value Date   VD25OH 48.6 03/04/2022   VD25OH 58.7 10/14/2021   VD25OH 39.9 06/24/2021   Lab Results  Component Value Date   WBC 4.9 06/24/2021   HGB 13.6 06/24/2021   HCT 41.5 06/24/2021   MCV 88 06/24/2021   PLT 373  06/24/2021   No results found for: "IRON", "TIBC", "FERRITIN"  Attestation Statements:   Reviewed by clinician on day of visit: allergies, medications, problem list, medical history, surgical history, family history, social history, and previous encounter notes.   I, Burt Knack, am acting as transcriptionist for Reuben Likes, MD.  I have reviewed the above documentation for accuracy and completeness, and I agree with the above. - Reuben Likes, MD

## 2023-02-25 ENCOUNTER — Other Ambulatory Visit: Payer: Self-pay | Admitting: Otolaryngology

## 2023-02-25 DIAGNOSIS — H9042 Sensorineural hearing loss, unilateral, left ear, with unrestricted hearing on the contralateral side: Secondary | ICD-10-CM

## 2023-03-04 NOTE — Progress Notes (Signed)
Complete physical exam  Patient: Krista Cooper   DOB: 04-Mar-1956   67 y.o. Female  MRN: [id number]  Subjective:    Chief Complaint  Patient presents with   Annual Exam    Krista Cooper is a 67 y.o. female who presents today for a complete physical exam. She reports consuming a general diet.  She generally feels well. She reports sleeping well. She does not have additional problems to discuss today.    Discussed the use of AI scribe software for clinical note transcription with the patient, who gave verbal consent to proceed.  History of Present Illness   The patient, with a history of hypertension, hyperlipidemia, diabetes, and anxiety, presents for a routine physical examination. They report dryness and minor bleeding in the nose, which they attribute to the use of Flonase. They have also noticed a decrease in hearing in one ear, which has been evaluated by an ENT specialist. The patient has been prescribed a course of prednisone, which temporarily alleviated back pain and joint discomfort. They also report occasional restlessness, particularly when sitting on the couch or in tight spaces, but do not find it bothersome enough to seek treatment. The patient had COVID-19 in July and is considering getting a booster shot in a few weeks. They also mention a bunion on their foot, which causes discomfort but they prefer not to have surgery. The patient's current medications include Sertraline for anxiety, Maxzide 25 for hypertension, Crestor 10mg  for hyperlipidemia, and Ozempic for diabetes. They also take psyllium for constipation related to their diet.       Most recent fall risk assessment:    01/16/2023    9:24 AM  Fall Risk   Falls in the past year? 0  Risk for fall due to : No Fall Risks  Follow up Falls prevention discussed;Education provided     Most recent depression screenings:    03/05/2023    9:54 AM 01/20/2023   10:10 AM  PHQ 2/9 Scores  PHQ - 2 Score 0 0         Patient Care Team: Erasmo Downer, MD as PCP - General (Family Medicine) Galen Manila, MD as Referring Physician (Ophthalmology)   Outpatient Medications Prior to Visit  Medication Sig   ALPRAZolam (XANAX) 0.25 MG tablet Take 1 tablet (0.25 mg total) by mouth 2 (two) times daily as needed for anxiety.   Cholecalciferol (VITAMIN D3) 125 MCG (5000 UT) CAPS Take 1 capsule (5,000 Units total) by mouth daily.   Coenzyme Q10 (COQ10) 200 MG CAPS Take 200 mg by mouth daily with breakfast.   fluticasone (FLONASE) 50 MCG/ACT nasal spray Place 2 sprays into both nostrils daily.   Insulin Pen Needle (BD PEN NEEDLE NANO 2ND GEN) 32G X 4 MM MISC 1 Package by Does not apply route 2 (two) times daily.   levocetirizine (XYZAL) 5 MG tablet TAKE 1 TABLET BY MOUTH ONCE DAILY IN THE EVENING   MAGNESIUM GLYCINATE PO Take 240 mg by mouth.   MULTIPLE VITAMIN PO Take 1 tablet by mouth daily.   Omega-3 Fatty Acids (FISH OIL) 1200 MG CAPS Take 2 capsules (2,400 mg total) by mouth daily.   Psyllium Fiber 0.52 g CAPS Take by mouth.   rosuvastatin (CRESTOR) 10 MG tablet Take 1 tablet (10 mg total) by mouth daily.   Semaglutide,0.25 or 0.5MG /DOS, (OZEMPIC, 0.25 OR 0.5 MG/DOSE,) 2 MG/3ML SOPN Inject 0.5 mg into the skin once a week.   sertraline (ZOLOFT) 50 MG tablet  Take 1 tablet by mouth once daily   triamterene-hydrochlorothiazide (MAXZIDE-25) 37.5-25 MG tablet Take 1 tablet by mouth once daily   No facility-administered medications prior to visit.    ROS        Objective:     BP 128/70 (BP Location: Left Arm, Patient Position: Sitting, Cuff Size: Normal)   Pulse 95   Temp 97.9 F (36.6 C)   Resp 16   Ht 5\' 6"  (1.676 m)   Wt 198 lb (89.8 kg)   SpO2 97%   BMI 31.96 kg/m    Physical Exam Vitals reviewed.  Constitutional:      General: She is not in acute distress.    Appearance: Normal appearance. She is well-developed. She is not diaphoretic.  HENT:     Head:  Normocephalic and atraumatic.     Right Ear: Tympanic membrane, ear canal and external ear normal.     Left Ear: Tympanic membrane, ear canal and external ear normal.     Nose: Nose normal.     Mouth/Throat:     Mouth: Mucous membranes are moist.     Pharynx: Oropharynx is clear. No oropharyngeal exudate.  Eyes:     General: No scleral icterus.    Conjunctiva/sclera: Conjunctivae normal.     Pupils: Pupils are equal, round, and reactive to light.  Neck:     Thyroid: No thyromegaly.  Cardiovascular:     Rate and Rhythm: Normal rate and regular rhythm.     Heart sounds: Normal heart sounds. No murmur heard. Pulmonary:     Effort: Pulmonary effort is normal. No respiratory distress.     Breath sounds: Normal breath sounds. No wheezing or rales.  Abdominal:     General: There is no distension.     Palpations: Abdomen is soft.     Tenderness: There is no abdominal tenderness.  Musculoskeletal:        General: No deformity.     Cervical back: Neck supple.     Right lower leg: No edema.     Left lower leg: No edema.  Lymphadenopathy:     Cervical: No cervical adenopathy.  Skin:    General: Skin is warm and dry.     Findings: No rash.  Neurological:     Mental Status: She is alert and oriented to person, place, and time. Mental status is at baseline.     Gait: Gait normal.  Psychiatric:        Mood and Affect: Mood normal.        Behavior: Behavior normal.        Thought Content: Thought content normal.      No results found for any visits on 03/05/23.     Assessment & Plan:    Routine Health Maintenance and Physical Exam  Immunization History  Administered Date(s) Administered   Fluad Quad(high Dose 65+) 03/07/2021, 03/10/2022   Fluad Trivalent(High Dose 65+) 03/05/2023   Influenza,inj,Quad PF,6+ Mos 02/02/2014, 02/15/2015, 03/19/2016, 03/28/2018, 04/03/2019, 04/03/2020   Influenza-Unspecified 03/16/2017   PFIZER Comirnaty(Gray Top)Covid-19 Tri-Sucrose Vaccine  05/27/2020, 09/27/2020, 02/10/2021, 02/16/2022   PFIZER(Purple Top)SARS-COV-2 Vaccination 11/06/2019, 11/27/2019   PNEUMOCOCCAL CONJUGATE-20 12/23/2020   Pfizer Covid-19 Vaccine Bivalent Booster 24yrs & up 02/16/2022, 10/14/2022   Td 03/19/2016   Tdap 10/15/2005   Zoster Recombinant(Shingrix) 09/30/2018, 12/12/2018    Health Maintenance  Topic Date Due   COVID-19 Vaccine (8 - 2023-24 season) 01/17/2023   HEMOGLOBIN A1C  03/03/2023   OPHTHALMOLOGY EXAM  03/28/2023   FOOT  EXAM  09/01/2023   Diabetic kidney evaluation - eGFR measurement  09/04/2023   Diabetic kidney evaluation - Urine ACR  09/04/2023   Medicare Annual Wellness (AWV)  01/20/2024   MAMMOGRAM  12/27/2024   DTaP/Tdap/Td (3 - Td or Tdap) 03/19/2026   Colonoscopy  12/22/2027   Pneumonia Vaccine 70+ Years old  Completed   INFLUENZA VACCINE  Completed   DEXA SCAN  Completed   Hepatitis C Screening  Completed   Zoster Vaccines- Shingrix  Completed   HPV VACCINES  Aged Out   Fecal DNA (Cologuard)  Discontinued    Discussed health benefits of physical activity, and encouraged her to engage in regular exercise appropriate for her age and condition.  Problem List Items Addressed This Visit       Cardiovascular and Mediastinum   Hypertension associated with diabetes (HCC)    Well-controlled on Maxzide 25mg . -Continue Maxzide 25mg .      Relevant Orders   Comprehensive metabolic panel     Respiratory   Allergic rhinitis    Nasal Dryness Noted dryness and minor bleeding in the nose, likely secondary to Flonase use. -Apply Vaseline to the affected area using a Q-tip. -Take a break from Flonase for a couple of days to allow the area to heal.        Endocrine   Hyperlipidemia associated with type 2 diabetes mellitus (HCC)    Well-controlled on Crestor 10mg . -Continue Crestor 10mg .      Relevant Orders   Comprehensive metabolic panel   Lipid panel   T2DM (type 2 diabetes mellitus) (HCC)    Managed with Ozempic  0.5mg  weekly. -Continue Ozempic 0.5mg  weekly.      Relevant Orders   Hemoglobin A1c     Other   Anxiety    Chronic and well-controlled Continue Zoloft 50 mg daily Continue to use Xanax very sparingly for situational anxiety, including public speaking      Avitaminosis D   Relevant Orders   VITAMIN D 25 Hydroxy (Vit-D Deficiency, Fractures)   Class 1 obesity with serious comorbidity and body mass index (BMI) of 32.0 to 32.9 in adult   Other Visit Diagnoses     Encounter for annual physical exam    -  Primary   Relevant Orders   Hemoglobin A1c   Comprehensive metabolic panel   Lipid panel   VITAMIN D 25 Hydroxy (Vit-D Deficiency, Fractures)   Influenza vaccine needed       Relevant Orders   Flu Vaccine Trivalent High Dose (Fluad) (Completed)   Immunization due       Relevant Orders   Flu Vaccine Trivalent High Dose (Fluad) (Completed)           Colon Polyps History of colon polyps detected during colonoscopy in August 2024. -Repeat colonoscopy in 5 years as recommended by the gastroenterologist.  Restless Legs Infrequent episodes of restless legs, particularly when sitting on the couch. -Continue taking a multivitamin with iron. -If symptoms become more frequent or bothersome, consider evaluation for restless leg syndrome.  General Health Maintenance -Received flu shot. -Continue with annual eye exam, scheduled for November 2024. -Consider receiving RSV shot at the pharmacy. -Consider receiving COVID-19 booster shot at the pharmacy in a couple of weeks. -Return for fasting labs next week. -Schedule a 6-month follow-up appointment.        Return in about 6 months (around 09/03/2023) for chronic disease f/u.     Shirlee Latch, MD

## 2023-03-05 ENCOUNTER — Ambulatory Visit: Payer: Medicare Other | Admitting: Family Medicine

## 2023-03-05 ENCOUNTER — Encounter: Payer: Self-pay | Admitting: Family Medicine

## 2023-03-05 VITALS — BP 128/70 | HR 95 | Temp 97.9°F | Resp 16 | Ht 66.0 in | Wt 198.0 lb

## 2023-03-05 DIAGNOSIS — Z0001 Encounter for general adult medical examination with abnormal findings: Secondary | ICD-10-CM | POA: Diagnosis not present

## 2023-03-05 DIAGNOSIS — I152 Hypertension secondary to endocrine disorders: Secondary | ICD-10-CM

## 2023-03-05 DIAGNOSIS — E66811 Obesity, class 1: Secondary | ICD-10-CM | POA: Diagnosis not present

## 2023-03-05 DIAGNOSIS — E1159 Type 2 diabetes mellitus with other circulatory complications: Secondary | ICD-10-CM | POA: Diagnosis not present

## 2023-03-05 DIAGNOSIS — E1169 Type 2 diabetes mellitus with other specified complication: Secondary | ICD-10-CM

## 2023-03-05 DIAGNOSIS — Z6832 Body mass index (BMI) 32.0-32.9, adult: Secondary | ICD-10-CM

## 2023-03-05 DIAGNOSIS — J301 Allergic rhinitis due to pollen: Secondary | ICD-10-CM

## 2023-03-05 DIAGNOSIS — Z23 Encounter for immunization: Secondary | ICD-10-CM

## 2023-03-05 DIAGNOSIS — F419 Anxiety disorder, unspecified: Secondary | ICD-10-CM

## 2023-03-05 DIAGNOSIS — Z0000 Encounter for general adult medical examination without abnormal findings: Secondary | ICD-10-CM

## 2023-03-05 DIAGNOSIS — E785 Hyperlipidemia, unspecified: Secondary | ICD-10-CM

## 2023-03-05 DIAGNOSIS — E559 Vitamin D deficiency, unspecified: Secondary | ICD-10-CM

## 2023-03-05 NOTE — Assessment & Plan Note (Signed)
Well-controlled on Maxzide 25mg . -Continue Maxzide 25mg .

## 2023-03-05 NOTE — Assessment & Plan Note (Signed)
Nasal Dryness Noted dryness and minor bleeding in the nose, likely secondary to Flonase use. -Apply Vaseline to the affected area using a Q-tip. -Take a break from Flonase for a couple of days to allow the area to heal.

## 2023-03-05 NOTE — Assessment & Plan Note (Signed)
Managed with Ozempic 0.5mg  weekly. -Continue Ozempic 0.5mg  weekly.

## 2023-03-05 NOTE — Assessment & Plan Note (Signed)
Chronic and well-controlled ?Continue Zoloft 50 mg daily ?Continue to use Xanax very sparingly for situational anxiety, including public speaking ?

## 2023-03-05 NOTE — Assessment & Plan Note (Signed)
Well-controlled on Crestor 10mg . -Continue Crestor 10mg .

## 2023-03-12 DIAGNOSIS — Z0000 Encounter for general adult medical examination without abnormal findings: Secondary | ICD-10-CM | POA: Diagnosis not present

## 2023-03-12 DIAGNOSIS — E785 Hyperlipidemia, unspecified: Secondary | ICD-10-CM | POA: Diagnosis not present

## 2023-03-12 DIAGNOSIS — E1159 Type 2 diabetes mellitus with other circulatory complications: Secondary | ICD-10-CM | POA: Diagnosis not present

## 2023-03-12 DIAGNOSIS — I152 Hypertension secondary to endocrine disorders: Secondary | ICD-10-CM | POA: Diagnosis not present

## 2023-03-12 DIAGNOSIS — E1169 Type 2 diabetes mellitus with other specified complication: Secondary | ICD-10-CM | POA: Diagnosis not present

## 2023-03-12 DIAGNOSIS — E559 Vitamin D deficiency, unspecified: Secondary | ICD-10-CM | POA: Diagnosis not present

## 2023-03-13 LAB — COMPREHENSIVE METABOLIC PANEL
ALT: 28 [IU]/L (ref 0–32)
AST: 34 [IU]/L (ref 0–40)
Albumin: 4.5 g/dL (ref 3.9–4.9)
Alkaline Phosphatase: 74 [IU]/L (ref 44–121)
BUN/Creatinine Ratio: 38 — ABNORMAL HIGH (ref 12–28)
BUN: 23 mg/dL (ref 8–27)
Bilirubin Total: 0.3 mg/dL (ref 0.0–1.2)
CO2: 24 mmol/L (ref 20–29)
Calcium: 9.5 mg/dL (ref 8.7–10.3)
Chloride: 97 mmol/L (ref 96–106)
Creatinine, Ser: 0.61 mg/dL (ref 0.57–1.00)
Globulin, Total: 2.1 g/dL (ref 1.5–4.5)
Glucose: 104 mg/dL — ABNORMAL HIGH (ref 70–99)
Potassium: 4.3 mmol/L (ref 3.5–5.2)
Sodium: 133 mmol/L — ABNORMAL LOW (ref 134–144)
Total Protein: 6.6 g/dL (ref 6.0–8.5)
eGFR: 98 mL/min/{1.73_m2} (ref >59)

## 2023-03-13 LAB — VITAMIN D 25 HYDROXY (VIT D DEFICIENCY, FRACTURES): Vit D, 25-Hydroxy: 68.5 ng/mL (ref 30.0–100.0)

## 2023-03-13 LAB — HEMOGLOBIN A1C
Est. average glucose Bld gHb Est-mCnc: 137 mg/dL
Hgb A1c MFr Bld: 6.4 % — ABNORMAL HIGH (ref 4.8–5.6)

## 2023-03-13 LAB — LIPID PANEL
Chol/HDL Ratio: 3.1 {ratio} (ref 0.0–4.4)
Cholesterol, Total: 201 mg/dL — ABNORMAL HIGH (ref 100–199)
HDL: 65 mg/dL (ref >39)
LDL Chol Calc (NIH): 121 mg/dL — ABNORMAL HIGH (ref 0–99)
Triglycerides: 83 mg/dL (ref 0–149)
VLDL Cholesterol Cal: 15 mg/dL (ref 5–40)

## 2023-03-15 ENCOUNTER — Ambulatory Visit
Admission: RE | Admit: 2023-03-15 | Discharge: 2023-03-15 | Disposition: A | Discharge status: Home / Self Care | Payer: Medicare Other | Source: Ambulatory Visit | Attending: Otolaryngology | Admitting: Otolaryngology

## 2023-03-15 DIAGNOSIS — H9042 Sensorineural hearing loss, unilateral, left ear, with unrestricted hearing on the contralateral side: Secondary | ICD-10-CM

## 2023-03-15 MED ORDER — GADOPICLENOL 0.5 MMOL/ML IV SOLN
10.0000 mL | Freq: Once | INTRAVENOUS | Status: AC | PRN
  Administered 2023-03-15: 9 mL via INTRAVENOUS

## 2023-03-16 ENCOUNTER — Telehealth: Payer: Self-pay

## 2023-03-16 NOTE — Telephone Encounter (Signed)
Copied from CRM [id number]. Topic: General - Other >> Mar 16, 2023 10:13 AM Macon Large wrote: Reason for CRM: Pt called back to advise that she agree to increasing Rosuvastatin to 20 mg

## 2023-03-17 DIAGNOSIS — F3342 Major depressive disorder, recurrent, in full remission: Secondary | ICD-10-CM | POA: Diagnosis not present

## 2023-03-17 DIAGNOSIS — M1990 Unspecified osteoarthritis, unspecified site: Secondary | ICD-10-CM | POA: Diagnosis not present

## 2023-03-17 MED ORDER — ROSUVASTATIN CALCIUM 20 MG PO TABS: 20.0000 mg | ORAL_TABLET | Freq: Every day | ORAL | 1 refills | Status: DC

## 2023-03-17 NOTE — Addendum Note (Signed)
Addended by: Marjie Skiff on: 03/17/2023 11:36 AM   Modules accepted: Orders

## 2023-03-17 NOTE — Telephone Encounter (Signed)
Prescription sent in  

## 2023-03-19 DIAGNOSIS — E119 Type 2 diabetes mellitus without complications: Secondary | ICD-10-CM | POA: Diagnosis not present

## 2023-03-24 ENCOUNTER — Ambulatory Visit (INDEPENDENT_AMBULATORY_CARE_PROVIDER_SITE_OTHER): Payer: Medicare Other | Admitting: Physician Assistant

## 2023-03-24 ENCOUNTER — Other Ambulatory Visit: Payer: Self-pay | Admitting: Family Medicine

## 2023-03-24 ENCOUNTER — Encounter (INDEPENDENT_AMBULATORY_CARE_PROVIDER_SITE_OTHER): Payer: Self-pay | Admitting: Physician Assistant

## 2023-03-24 VITALS — BP 120/75 | HR 78 | Temp 98.2°F | Ht 66.0 in | Wt 196.0 lb

## 2023-03-24 DIAGNOSIS — K5901 Slow transit constipation: Secondary | ICD-10-CM

## 2023-03-24 DIAGNOSIS — E1159 Type 2 diabetes mellitus with other circulatory complications: Secondary | ICD-10-CM | POA: Diagnosis not present

## 2023-03-24 DIAGNOSIS — I152 Hypertension secondary to endocrine disorders: Secondary | ICD-10-CM | POA: Diagnosis not present

## 2023-03-24 DIAGNOSIS — Z7985 Long-term (current) use of injectable non-insulin antidiabetic drugs: Secondary | ICD-10-CM

## 2023-03-24 DIAGNOSIS — Z6832 Body mass index (BMI) 32.0-32.9, adult: Secondary | ICD-10-CM

## 2023-03-24 DIAGNOSIS — E669 Obesity, unspecified: Secondary | ICD-10-CM

## 2023-03-24 DIAGNOSIS — E1169 Type 2 diabetes mellitus with other specified complication: Secondary | ICD-10-CM | POA: Diagnosis not present

## 2023-03-24 MED ORDER — SEMAGLUTIDE (1 MG/DOSE) 4 MG/3ML ~~LOC~~ SOPN
1.0000 mg | PEN_INJECTOR | SUBCUTANEOUS | 0 refills | Status: DC
Start: 2023-03-24 — End: 2023-04-14

## 2023-03-24 NOTE — Progress Notes (Signed)
.[facility]  Office: [phone]  /  Fax: [phone]  WEIGHT SUMMARY AND BIOMETRICS  No data recorded  No data recorded  No data recorded  No data recorded    HPI  Chief Complaint: OBESITY  Krista Cooper is here to discuss her progress with her obesity treatment plan. She is on the the Category 2 Plan and states she is following her eating plan approximately 88 % of the time. She states she is exercising walking dog, yoga/aerobics 30 minutes 5-6 times per week.   Interval History:  Since last office visit she is down 1 lb.  high stress levels due to work. Discussed strategies for stress management, including sleep yoga and writing down tasks to clear the mind before sleep. Discussed the use of AI scribe software for clinical note transcription with the patient, who gave verbal consent to proceed.  History of Present Illness   The patient, with a history of obesity, type two diabetes, hypertension, and anxiety, presents for a follow-up of her obesity treatment plan. She reports significant improvement in her constipation since starting psyllium husk fiber capsules. She has been adhering to her exercise regimen and dietary plan, including taking psyllium husk fiber capsules daily. She has noticed that the capsules help her feel full and have improved her bowel regularity. However, she is considering adjusting the timing of her doses as she sometimes feels she is going too frequently. She also reports that missing her protein intake makes her feel hungry.  The patient has been on Ozempic for her obesity and diabetes management. She has run out of her medication and is considering increasing the dose to promote more weight loss. She reports no significant side effects from the medication. She has an upcoming appointment with another healthcare provider for further management.        Pharmacotherapy: ozempic 0.5 mg weekly. Denies mass in neck, dysphagia, dyspepsia, persistent hoarseness,  abdominal pain, or N/V/Constipation or diarrhea. Has annual eye exam. Mood is stable.  You can administer smaller dosages from the 1 mg Ozempic pen. Once you count the clicks for the desired dose, mark that spot in the window with a pen so you won't have to count for future injections.  0.25 mg = 18 clicks 0.5 mg = 36 clicks 0.75 mg = 54 clicks  TREATMENT PLAN FOR OBESITY:  Recommended Dietary Goals  Krista Cooper is currently in the action stage of change. As such, her goal is to continue weight management plan. She has agreed to the Category 2 Plan.  Behavioral Intervention  We discussed the following Behavioral Modification Strategies today: continue to work on maintaining a reduced calorie state, getting the recommended amount of protein, incorporating whole foods, making healthy choices, staying well hydrated and practicing mindfulness when eating..  Additional resources provided today: NA  Recommended Physical Activity Goals  Krista Cooper has been advised to work up to 150 minutes of moderate intensity aerobic activity a week and strengthening exercises 2-3 times per week for cardiovascular health, weight loss maintenance and preservation of muscle mass.   She has agreed to Think about enjoyable ways to increase daily physical activity and overcoming barriers to exercise and Increase physical activity in their day and reduce sedentary time (increase NEAT).   Pharmacotherapy We discussed various medication options to help Krista Cooper with her weight loss efforts and we both agreed to increase Ozempic to 0.75 mg weekly for Type 2 diabetes management and to promote weight loss.   Follow up in 4 weeks.Marland Kitchen She was informed of  the importance of frequent follow up visits to maximize her success with intensive lifestyle modifications for her multiple health conditions.  PHYSICAL EXAM:  Blood pressure 120/75, pulse 78, temperature 98.2 F (36.8 C), height 5\' 6"  (1.676 m), weight 196 lb (88.9 kg),  SpO2 99%. Body mass index is 31.64 kg/m.  General: She is overweight, cooperative, alert, well developed, and in no acute distress. PSYCH: Has normal mood, affect and thought process.   Lungs: Normal breathing effort, no conversational dyspnea.  DIAGNOSTIC DATA REVIEWED:  BMET    Component Value Date/Time   NA 133 (L) 03/12/2023 0856   K 4.3 03/12/2023 0856   CL 97 03/12/2023 0856   CO2 24 03/12/2023 0856   GLUCOSE 104 (H) 03/12/2023 0856   BUN 23 03/12/2023 0856   CREATININE 0.61 03/12/2023 0856   CALCIUM 9.5 03/12/2023 0856   GFRNONAA 98 10/13/2019 1003   GFRAA 113 10/13/2019 1003   Lab Results  Component Value Date   HGBA1C 6.4 (H) 03/12/2023   HGBA1C 6.3 (A) 08/22/2014   Lab Results  Component Value Date   INSULIN 9.0 10/14/2021   INSULIN 11.3 06/24/2021   Lab Results  Component Value Date   TSH 2.120 06/24/2021   CBC    Component Value Date/Time   WBC 4.9 06/24/2021 1011   RBC 4.71 06/24/2021 1011   HGB 13.6 06/24/2021 1011   HCT 41.5 06/24/2021 1011   PLT 373 06/24/2021 1011   MCV 88 06/24/2021 1011   MCH 28.9 06/24/2021 1011   MCHC 32.8 06/24/2021 1011   RDW 13.1 06/24/2021 1011   Iron Studies No results found for: "IRON", "TIBC", "FERRITIN", "IRONPCTSAT" Lipid Panel     Component Value Date/Time   CHOL 201 (H) 03/12/2023 0856   TRIG 83 03/12/2023 0856   HDL 65 03/12/2023 0856   CHOLHDL 3.1 03/12/2023 0856   LDLCALC 121 (H) 03/12/2023 0856   Hepatic Function Panel     Component Value Date/Time   PROT 6.6 03/12/2023 0856   ALBUMIN 4.5 03/12/2023 0856   AST 34 03/12/2023 0856   ALT 28 03/12/2023 0856   ALKPHOS 74 03/12/2023 0856   BILITOT 0.3 03/12/2023 0856      Component Value Date/Time   TSH 2.120 06/24/2021 1011   Nutritional Lab Results  Component Value Date   VD25OH 68.5 03/12/2023   VD25OH 48.6 03/04/2022   VD25OH 58.7 10/14/2021    ASSOCIATED CONDITIONS ADDRESSED TODAY  ASSESSMENT AND PLAN  Problem List Items  Addressed This Visit     Hypertension associated with diabetes (HCC)   Relevant Medications   Semaglutide, 1 MG/DOSE, 4 MG/3ML SOPN   T2DM (type 2 diabetes mellitus) (HCC) - Primary   Relevant Medications   Semaglutide, 1 MG/DOSE, 4 MG/3ML SOPN   Other Visit Diagnoses     Slow transit constipation       Generalized obesity- START BMI 32.6       Relevant Medications   Semaglutide, 1 MG/DOSE, 4 MG/3ML SOPN      Type 2 Diabetes Mellitus with other specified complication, without long-term current use of insulin HgbA1c is not at goal. Last A1c was 6.4  Medication(s): Ozempic 0.5 mg SQ weekly Denies mass in neck, dysphagia, dyspepsia, persistent hoarseness, abdominal pain, or N/V/Constipation or diarrhea. Has annual eye exam. Mood is stable.  Constipation managed with increased fiber psyllium husk capsules and increased water.   She is working  on nutrition plan to decrease simple carbohydrates, increase lean proteins and exercise to promote  weight loss and improve glycemic control .   Lab Results  Component Value Date   HGBA1C 6.4 (H) 03/12/2023   HGBA1C 5.9 (A) 09/01/2022   HGBA1C 6.3 (H) 03/04/2022   Lab Results  Component Value Date   LDLCALC 121 (H) 03/12/2023   CREATININE 0.61 03/12/2023   No results found for: "GFR"  Plan: Continue and increase dose Ozempic 1 mg SQ weekly She is anxious about increased dose and we discussed she can slowly increase using above guidelines to start lower dose initially and see how she tolerates following increase. Continue  working  on nutrition plan to decrease simple carbohydrates, increase lean proteins and exercise to promote weight loss and improve glycemic control .   Hypertension Hypertension asymptomatic, reasonably well controlled, and no significant medication side effects noted.  Medication(s): triamterene-hydrochlorothiazide 37.5-25 mg daily.  No side effects . Renal function normal.   BP Readings from Last 3 Encounters:   03/24/23 120/75  03/05/23 128/70  02/22/23 129/74   Lab Results  Component Value Date   CREATININE 0.61 03/12/2023   CREATININE 0.70 09/04/2022   CREATININE 0.64 03/04/2022   No results found for: "GFR"  Plan: Continue all antihypertensives at current dosages. Continue to work on nutrition plan to promote weight loss and improve BP control.    Constipation Jozelyn notes constipation.   This is likely related to GLP-1.  She has been taking psyllium husk capsules daily with good results. Constipation is well controlled.   Plan: Continue psyllium husk capsules and monitor closely with increased Ozempic dose over the next month.  Increase fiber and water. Add MiraLAX once daily.  May take twice a day or every other day based on results.    ATTESTASTION STATEMENTS:  Reviewed by clinician on day of visit: allergies, medications, problem list, medical history, surgical history, family history, social history, and previous encounter notes.   I have personally spent 30 minutes total time today in preparation, patient care, nutritional counseling and documentation for this visit, including the following: review of clinical lab tests; review of medical tests/procedures/services.      Romari Gasparro, PA-C

## 2023-03-24 NOTE — Telephone Encounter (Signed)
Medication Refill -  Most Recent Primary Care Visit:  Provider: Lazaro Arms MONTGOMERY  Department: [facility]  Visit Type: FOLLOW UP  Date: 03/24/2023  Medication:  Xanax   *Has 3 left but date has expired and patient states PCP gives her 10 pills a year. Patient states she will pick up her refills in the next few days and would like this called in to pick up with the rest of her medication. She states no anxiety/depression now.*  Has the patient contacted their pharmacy? Yes, advised to contact PCP  Is this the correct pharmacy for this prescription? YES. If no, delete pharmacy and type the correct one.  This is the patient's preferred pharmacy:  [facility] [address], [place] - [address] [place] [address] Phone: [phone] Fax: [phone]   Has the prescription been filled recently? No.  Is the patient out of the medication? No, has 3 left but date has expired.  Has the patient been seen for an appointment in the last year OR does the patient have an upcoming appointment? Yes, last saw PCP for CPE on 10.18.2024

## 2023-03-25 ENCOUNTER — Other Ambulatory Visit: Payer: Self-pay | Admitting: Family Medicine

## 2023-03-25 NOTE — Telephone Encounter (Signed)
Request is too soon for refill, last refill 03/24/23. Duplicate request.  Requested Prescriptions  Pending Prescriptions Disp Refills   triamterene-hydrochlorothiazide (MAXZIDE-25) 37.5-25 MG tablet [Pharmacy Med Name: Triamterene-HCTZ 37.5-25 MG Oral Tablet] 90 tablet 0    Sig: Take 1 tablet by mouth once daily     Cardiovascular: Diuretic Combos Failed - 03/25/2023  6:40 AM      Failed - Na in normal range and within 180 days    Sodium  Date Value Ref Range Status  03/12/2023 133 (L) 134 - 144 mmol/L Final         Passed - K in normal range and within 180 days    Potassium  Date Value Ref Range Status  03/12/2023 4.3 3.5 - 5.2 mmol/L Final         Passed - Cr in normal range and within 180 days    Creatinine, Ser  Date Value Ref Range Status  03/12/2023 0.61 0.57 - 1.00 mg/dL Final         Passed - Last BP in normal range    BP Readings from Last 1 Encounters:  03/24/23 120/75         Passed - Valid encounter within last 6 months    Recent Outpatient Visits           2 weeks ago Encounter for annual physical exam   [place] [facility], [name], MD   3 months ago COVID   [facility] Merita Norton T, FNP   6 months ago Type 2 diabetes mellitus with hyperglycemia, without long-term current use of insulin [facility])   [place] [facility] [place], [name], MD   7 months ago Acute non-recurrent frontal sinusitis   [place] [facility] Alfredia Ferguson, PA-C   1 year ago Seasonal allergic rhinitis, unspecified trigger   [facility] Caro Laroche, DO       Future Appointments             In 5 months Bacigalupo, [name], MD [facility]

## 2023-03-25 NOTE — Telephone Encounter (Signed)
Requested medications are due for refill today.  yes  Requested medications are on the active medications list.  yes  Last refill. 03/10/2022 #10 0 rf  Future visit scheduled.   yes  Notes to clinic.  Refill not delegated.    Requested Prescriptions  Pending Prescriptions Disp Refills   ALPRAZolam (XANAX) 0.25 MG tablet 10 tablet 0    Sig: Take 1 tablet (0.25 mg total) by mouth 2 (two) times daily as needed for anxiety.     Not Delegated - Psychiatry: Anxiolytics/Hypnotics 2 Failed - 03/24/2023  2:55 PM      Failed - This refill cannot be delegated      Failed - Urine Drug Screen completed in last 360 days      Passed - Patient is not pregnant      Passed - Valid encounter within last 6 months    Recent Outpatient Visits           2 weeks ago Encounter for annual physical exam   [facility] [place], [name], MD   3 months ago COVID   [facility] Merita Norton T, FNP   6 months ago Type 2 diabetes mellitus with hyperglycemia, without long-term current use of insulin [facility])    [facility] [place], [name], MD   7 months ago Acute non-recurrent frontal sinusitis    [facility] Alfredia Ferguson, PA-C   1 year ago Seasonal allergic rhinitis, unspecified trigger   [facility] Caro Laroche, DO       Future Appointments             In 5 months Bacigalupo, [name], MD [facility]

## 2023-03-26 MED ORDER — ALPRAZOLAM 0.25 MG PO TABS: 0.2500 mg | ORAL_TABLET | Freq: Two times a day (BID) | ORAL | 0 refills | Status: AC | PRN

## 2023-03-29 DIAGNOSIS — H2513 Age-related nuclear cataract, bilateral: Secondary | ICD-10-CM | POA: Diagnosis not present

## 2023-03-29 DIAGNOSIS — E119 Type 2 diabetes mellitus without complications: Secondary | ICD-10-CM | POA: Diagnosis not present

## 2023-03-29 DIAGNOSIS — H3554 Dystrophies primarily involving the retinal pigment epithelium: Secondary | ICD-10-CM | POA: Diagnosis not present

## 2023-03-29 DIAGNOSIS — Z0100 Encounter for examination of eyes and vision without abnormal findings: Secondary | ICD-10-CM | POA: Diagnosis not present

## 2023-03-29 LAB — HM DIABETES EYE EXAM

## 2023-04-14 ENCOUNTER — Encounter: Payer: Self-pay | Admitting: Family Medicine

## 2023-04-14 ENCOUNTER — Encounter (INDEPENDENT_AMBULATORY_CARE_PROVIDER_SITE_OTHER): Payer: Self-pay | Admitting: Family Medicine

## 2023-04-14 ENCOUNTER — Ambulatory Visit (INDEPENDENT_AMBULATORY_CARE_PROVIDER_SITE_OTHER): Payer: Medicare Other | Admitting: Family Medicine

## 2023-04-14 VITALS — BP 124/71 | HR 95 | Temp 98.5°F | Ht 66.0 in | Wt 192.0 lb

## 2023-04-14 DIAGNOSIS — Z6831 Body mass index (BMI) 31.0-31.9, adult: Secondary | ICD-10-CM | POA: Diagnosis not present

## 2023-04-14 DIAGNOSIS — Z6832 Body mass index (BMI) 32.0-32.9, adult: Secondary | ICD-10-CM

## 2023-04-14 DIAGNOSIS — E669 Obesity, unspecified: Secondary | ICD-10-CM

## 2023-04-14 DIAGNOSIS — Z7985 Long-term (current) use of injectable non-insulin antidiabetic drugs: Secondary | ICD-10-CM

## 2023-04-14 DIAGNOSIS — E1169 Type 2 diabetes mellitus with other specified complication: Secondary | ICD-10-CM | POA: Diagnosis not present

## 2023-04-14 DIAGNOSIS — E785 Hyperlipidemia, unspecified: Secondary | ICD-10-CM

## 2023-04-14 MED ORDER — SEMAGLUTIDE (1 MG/DOSE) 4 MG/3ML ~~LOC~~ SOPN
1.0000 mg | PEN_INJECTOR | SUBCUTANEOUS | 0 refills | Status: DC
Start: 2023-04-14 — End: 2023-05-10

## 2023-04-14 NOTE — Assessment & Plan Note (Signed)
Currently on 0.75mg  of Ozempic with no significant side effects and good control of carb intake.  Her last A1c was 6.4 a bit over a month ago.  She is on crestor and ozempic.  She needs a refill of her Ozempic today so refill sent in and she will continue her current dose.

## 2023-04-14 NOTE — Assessment & Plan Note (Signed)
The 10-year ASCVD risk score (Arnett DK, et al., 2019) is: 15.3%   Values used to calculate the score:     Age: 67 years     Sex: Female     Is Non-Hispanic African American: No     Diabetic: Yes     Tobacco smoker: No     Systolic Blood Pressure: 124 mmHg     Is BP treated: Yes     HDL Cholesterol: 65 mg/dL     Total Cholesterol: 201 mg/dL  Last LDL elevated at 161 and her Crestor was increased from 10mg  to 20mg .  She denies any side effects of this dose increase.  She is following up with her PCP in April of 2025.

## 2023-04-14 NOTE — Progress Notes (Signed)
SUBJECTIVE:  Chief Complaint: Obesity  Interim History: Patient has been exercising more consistently since the last appointment.  She has been doing yoga for an hour once a week.  Once a week she will do aerobics at [facility].  Two other days she is doing band exercises.  Food wise she is trying to stick to the plan more consistently.  She is trying to get her total protein in and is struggling to get close to the 8oz at supper. She is still enjoying the meal plan.  She is employing more consistent food intake control particularly in terms of her carbohydrates. She has numerous invitations for Thanksgiving.  She has gotten her flu shot, COVID shot, RSV vaccine and her diabetic eye exam.  Krista Cooper is here to discuss her progress with her obesity treatment plan. She is on the Category 2 Plan and states she is following her eating plan approximately 88-90 % of the time. She states she is exercising 30-60 minutes 5 times per week.   OBJECTIVE: Visit Diagnoses: Problem List Items Addressed This Visit       Endocrine   Hyperlipidemia associated with type 2 diabetes mellitus (HCC)    The 10-year ASCVD risk score (Arnett DK, et al., 2019) is: 15.3%   Values used to calculate the score:     Age: 67 years     Sex: Female     Is Non-Hispanic African American: No     Diabetic: Yes     Tobacco smoker: No     Systolic Blood Pressure: 124 mmHg     Is BP treated: Yes     HDL Cholesterol: 65 mg/dL     Total Cholesterol: 201 mg/dL  Last LDL elevated at 161 and her Crestor was increased from 10mg  to 20mg .  She denies any side effects of this dose increase.  She is following up with her PCP in April of 2025.      Relevant Medications   Semaglutide, 1 MG/DOSE, 4 MG/3ML SOPN   T2DM (type 2 diabetes mellitus) (HCC) - Primary    Currently on 0.75mg  of Ozempic with no significant side effects and good control of carb intake.  Her last A1c was 6.4 a bit over a month ago.  She is on crestor and ozempic.  She  needs a refill of her Ozempic today so refill sent in and she will continue her current dose.      Relevant Medications   Semaglutide, 1 MG/DOSE, 4 MG/3ML SOPN    Vitals Temp: 98.5 F (36.9 C) BP: 124/71 Pulse Rate: 95 SpO2: 98 %   Anthropometric Measurements Height: 5\' 6"  (1.676 m) Weight: 192 lb (87.1 kg) BMI (Calculated): 31 Weight at Last Visit: 196 lb Weight Lost Since Last Visit: 4 Weight Gained Since Last Visit: 0 Starting Weight: 202 lb Total Weight Loss (lbs): 10 lb (4.536 kg)   Body Composition  Body Fat %: 41 % Fat Mass (lbs): 78.8 lbs Muscle Mass (lbs): 107.8 lbs Total Body Water (lbs): 71.8 lbs Visceral Fat Rating : 11   Other Clinical Data Today's Visit #: 25 Starting Date: 06/24/21     ASSESSMENT AND PLAN:  Diet: Krista Cooper is currently in the action stage of change. As such, her goal is to continue with weight loss efforts. She has agreed to Category 2 Plan.  Exercise: Krista Cooper has been instructed to continue exercising as is for weight loss and overall health benefits.   Behavior Modification:  We discussed the following Behavioral Modification  Strategies today: increasing lean protein intake, increasing vegetables, keeping healthy foods in the home, better snacking choices, holiday eating strategies, and avoiding temptations.   No follow-ups on file.Marland Kitchen She was informed of the importance of frequent follow up visits to maximize her success with intensive lifestyle modifications for her multiple health conditions.  Attestation Statements:   Reviewed by clinician on day of visit: allergies, medications, problem list, medical history, surgical history, family history, social history, and previous encounter notes.    Reuben Likes, MD

## 2023-04-18 DIAGNOSIS — E119 Type 2 diabetes mellitus without complications: Secondary | ICD-10-CM | POA: Diagnosis not present

## 2023-05-06 ENCOUNTER — Encounter: Payer: Self-pay | Admitting: Family Medicine

## 2023-05-09 NOTE — Progress Notes (Unsigned)
SUBJECTIVE:  Chief Complaint: Obesity  Interim History: She is up 1 lb since her last visit.  Down 9 lbs overall.   Krista Cooper, a 67 year old patient with a history of type 2 diabetes, hypertension, and hyperlipidemia, presents for a follow-up visit regarding her obesity treatment plan. The patient reports adherence to dietary recommendations during recent holiday events, demonstrating portion control and avoidance of excessive sweets. Despite a brief lapse in exercise due to missing an aerobics class, the patient expresses commitment to resuming her routine and exploring alternative exercise options, such as YouTube workouts and band exercises.  She reports feeling full more quickly, possibly due to the increased dose of Ozempic to 0.75mg . This has made it challenging for her to consume the recommended amount of protein, leading her to consider spreading her protein intake throughout the day.  The patient also reports a recent course of prednisone for ear issues, which have resulted in muffled hearing in one ear. Despite an MRI showing no growth or abnormality, the patient's hearing has not fully returned, and she is considering the use of a hearing aid. The patient attributes her hearing issues to exposure to loud noises in her youth.  Overall, the patient demonstrates a proactive approach to her health, with a focus on maintaining her obesity treatment plan and managing her other health conditions. She is committed to continuing her efforts and is open to adjustments as needed. Krista Cooper is here to discuss her progress with her obesity treatment plan. She is on the Category 2 Plan and states she is following her eating plan approximately 85 % of the time. She states she is exercising walking/yoga 30/45-60 minutes 5-6/3-4 times per week.   OBJECTIVE: Visit Diagnoses: Problem List Items Addressed This Visit     Hyperlipidemia associated with type 2 diabetes mellitus (HCC)   Relevant  Medications   Semaglutide, 1 MG/DOSE, 4 MG/3ML SOPN   Hypertension associated with diabetes (HCC)   Relevant Medications   Semaglutide, 1 MG/DOSE, 4 MG/3ML SOPN   T2DM (type 2 diabetes mellitus) (HCC) - Primary   Relevant Medications   Semaglutide, 1 MG/DOSE, 4 MG/3ML SOPN   Other Visit Diagnoses       Slow transit constipation         Generalized obesity- START BMI 32.6       Relevant Medications   Semaglutide, 1 MG/DOSE, 4 MG/3ML SOPN     Obesity 67 year old with obesity, currently on Ozempic 0.75 mg. Reports queasiness after dose increase. Adhering to dietary recommendations, including portion control and avoiding high-fat foods. Exercise routine has slightly decreased but plans to resume. Weight has fluctuated due to recent holiday meals and travel. Discussed gradual increase of Ozempic to 1 mg, monitoring for tolerance and potential increased nausea. - Refill Ozempic 0.75 mg - Continue dietary modifications with portion control and protein intake - Encourage resumption of regular exercise, including walking and yoga - Consider spreading protein intake throughout the day - Increase water intake to at least 80 ounces per day  Type 2 Diabetes Mellitus Last A1c was 6.4, indicating good control. Manages diabetes with diet, exercise, and medication. Discussed maintaining protein intake and hydration to support glucose control. Refill and continue Ozempic currently at 0.75 mg weekly.  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 - Continue current diabetes management plan   Hypertension Blood pressure was elevated this morning, likely due to rushing to  the appointment. Typically well-controlled. Discussed regular monitoring and stress management. - Monitor blood pressure at home - Continue current antihypertensive medication- Maxzide 37.5-25 mg daily  Hyperlipidemia The  10-year ASCVD risk score (Arnett DK, et al., 2019) is: 19.6%   Values used to calculate the score:     Age: 58 years     Sex: Female     Is Non-Hispanic African American: No     Diabetic: Yes     Tobacco smoker: No     Systolic Blood Pressure: 142 mmHg     Is BP treated: Yes     HDL Cholesterol: 65 mg/dL     Total Cholesterol: 201 mg/dL  Managed with diet and medication. Emphasized dietary modifications to manage lipid levels. - Continue current lipid-lowering therapy- Crestor 20 mg daily.  Continue to work on nutrition plan -decreasing simple carbohydrates, increasing lean proteins, decreasing saturated fats and cholesterol , avoiding trans fats and exercise as able to promote weight loss, improve lipids and decrease cardiovascular risks.   Hearing Loss Reports muffled hearing in one ear, likely due to past exposure to loud noises. MRI of the brain was normal, ruling out acoustic neuroma. Discussed the option of a hearing aid if hearing does not improve. - Consider hearing aid for affected ear if hearing does not improve  General Health Maintenance Maintaining general health with regular exercise, dietary modifications, and medication adherence. Reports occasional allergy symptoms managed with Xyzal and Flonase. Discussed hydration and spreading protein intake throughout the day. - Continue current allergy management with Xyzal and Flonase - Encourage regular physical activity and healthy diet - Ensure adequate hydration  Follow-up - Schedule next follow-up visit 3-4 weeks after May 31, 2023 - Monitor for any issues with Ozempic prescription and adjust as needed.  Vitals Temp: 97.8 F (36.6 C) BP: (!) 142/78 Pulse Rate: 83 SpO2: 99 %   Anthropometric Measurements Height: 5\' 6"  (1.676 m) Weight: 193 lb (87.5 kg) BMI (Calculated): 31.17 Weight at Last Visit: 192lb Weight Lost Since Last Visit: 0lb Weight Gained Since Last Visit: 1lb Starting Weight: 202lb Total  Weight Loss (lbs): 9 lb (4.082 kg) Peak Weight: 239lb   Body Composition  Body Fat %: 41.7 % Fat Mass (lbs): 80.6 lbs Muscle Mass (lbs): 170 lbs Total Body Water (lbs): 72.8 lbs Visceral Fat Rating : 12   Other Clinical Data Fasting: no Labs: no Today's Visit #: 25 Starting Date: 06/24/21     ASSESSMENT AND PLAN:  Diet: Tima is currently in the action stage of change. As such, her goal is to continue with weight loss efforts. She has agreed to Category 2 Plan.  Exercise: Chardonae has been instructed to continue exercising as is for weight loss and overall health benefits.   Behavior Modification:  We discussed the following Behavioral Modification Strategies today: increasing lean protein intake, decreasing simple carbohydrates, increasing vegetables, increase H2O intake, increase high fiber foods, no skipping meals, meal planning and cooking strategies, emotional eating strategies , holiday eating strategies, and planning for success. We discussed various medication options to help Keaira with her weight loss efforts and we both agreed to continue Ozempic 0.75 mg weekly for Type 2 diabetes management.  Return in about 4 weeks (around 06/07/2023).Marland Kitchen She was informed of the importance of frequent follow up visits to maximize her success with intensive lifestyle modifications for her multiple health conditions.  Attestation Statements:   Reviewed by clinician on day of visit: allergies, medications, problem list, medical history, surgical history, family  history, social history, and previous encounter notes.   Time spent on visit including pre-visit chart review and post-visit care and charting was 42 minutes.    Cordarius Benning, PA-C

## 2023-05-10 ENCOUNTER — Encounter (INDEPENDENT_AMBULATORY_CARE_PROVIDER_SITE_OTHER): Payer: Self-pay | Admitting: Physician Assistant

## 2023-05-10 ENCOUNTER — Ambulatory Visit (INDEPENDENT_AMBULATORY_CARE_PROVIDER_SITE_OTHER): Payer: Medicare Other | Admitting: Physician Assistant

## 2023-05-10 VITALS — BP 142/78 | HR 83 | Temp 97.8°F | Ht 66.0 in | Wt 193.0 lb

## 2023-05-10 DIAGNOSIS — E1159 Type 2 diabetes mellitus with other circulatory complications: Secondary | ICD-10-CM | POA: Diagnosis not present

## 2023-05-10 DIAGNOSIS — I152 Hypertension secondary to endocrine disorders: Secondary | ICD-10-CM | POA: Diagnosis not present

## 2023-05-10 DIAGNOSIS — Z7985 Long-term (current) use of injectable non-insulin antidiabetic drugs: Secondary | ICD-10-CM

## 2023-05-10 DIAGNOSIS — E1169 Type 2 diabetes mellitus with other specified complication: Secondary | ICD-10-CM

## 2023-05-10 DIAGNOSIS — E785 Hyperlipidemia, unspecified: Secondary | ICD-10-CM | POA: Diagnosis not present

## 2023-05-10 DIAGNOSIS — Z6832 Body mass index (BMI) 32.0-32.9, adult: Secondary | ICD-10-CM

## 2023-05-10 DIAGNOSIS — H9191 Unspecified hearing loss, right ear: Secondary | ICD-10-CM

## 2023-05-10 DIAGNOSIS — E669 Obesity, unspecified: Secondary | ICD-10-CM

## 2023-05-10 DIAGNOSIS — K5901 Slow transit constipation: Secondary | ICD-10-CM

## 2023-05-10 MED ORDER — SEMAGLUTIDE (1 MG/DOSE) 4 MG/3ML ~~LOC~~ SOPN: 1.0000 mg | PEN_INJECTOR | SUBCUTANEOUS | 0 refills | Status: DC

## 2023-05-13 ENCOUNTER — Other Ambulatory Visit: Payer: Self-pay | Admitting: Family Medicine

## 2023-05-19 DIAGNOSIS — E119 Type 2 diabetes mellitus without complications: Secondary | ICD-10-CM | POA: Diagnosis not present

## 2023-05-20 ENCOUNTER — Other Ambulatory Visit: Payer: Self-pay | Admitting: Family Medicine

## 2023-05-31 ENCOUNTER — Encounter (INDEPENDENT_AMBULATORY_CARE_PROVIDER_SITE_OTHER): Payer: Self-pay | Admitting: Family Medicine

## 2023-05-31 ENCOUNTER — Ambulatory Visit (INDEPENDENT_AMBULATORY_CARE_PROVIDER_SITE_OTHER): Payer: Medicare Other | Admitting: Family Medicine

## 2023-05-31 VITALS — BP 127/66 | HR 84 | Temp 98.2°F | Ht 66.0 in | Wt 197.0 lb

## 2023-05-31 DIAGNOSIS — E1169 Type 2 diabetes mellitus with other specified complication: Secondary | ICD-10-CM | POA: Diagnosis not present

## 2023-05-31 DIAGNOSIS — Z6831 Body mass index (BMI) 31.0-31.9, adult: Secondary | ICD-10-CM

## 2023-05-31 DIAGNOSIS — Z7985 Long-term (current) use of injectable non-insulin antidiabetic drugs: Secondary | ICD-10-CM

## 2023-05-31 DIAGNOSIS — E785 Hyperlipidemia, unspecified: Secondary | ICD-10-CM | POA: Diagnosis not present

## 2023-05-31 DIAGNOSIS — E669 Obesity, unspecified: Secondary | ICD-10-CM

## 2023-05-31 NOTE — Progress Notes (Signed)
   SUBJECTIVE:  Chief Complaint: Obesity  Interim History: Patient went to the beach with her family Dec 7-14.She stayed local for the rest of the holidays.  Did well over the holiday in terms of getting nutrition in.  She realizes that she she may not have gotten all the protein in over the holidays.  She was sick over the weekend with gi illness. No upcoming plans for the next few weeks with events or activities.    Genesys is here to discuss her progress with her obesity treatment plan. She is on the Category 2 Plan and states she is following her eating plan approximately 90 % of the time. She states she is walking 30 minutes 5 times per week.   OBJECTIVE: Visit Diagnoses: Problem List Items Addressed This Visit       Endocrine   Hyperlipidemia associated with type 2 diabetes mellitus (HCC)   On rosuvastatin  20mg  daily.  Switched from atorvastatin after last labs.  Repeat labs in [place] with PCP.      T2DM (type 2 diabetes mellitus) (HCC) - Primary   On Ozempic  1mg  weekly now.  She has not gone completely up to 1mg  dose yet (taking 0.75mg ).  She has some GI uneasiness the first day after her medication but otherwise is doing well on this dose.  She does not need refill of her medication at this time.         Vitals Temp: 98.2 F (36.8 C) BP: 127/66 Pulse Rate: 84 SpO2: 99 %   Anthropometric Measurements Height: 5' 6 (1.676 m) Weight: 197 lb (89.4 kg) BMI (Calculated): 31.81 Weight at Last Visit: 193 lb Weight Lost Since Last Visit: 0 Weight Gained Since Last Visit: 4 Starting Weight: 202 lb Total Weight Loss (lbs): 5 lb (2.268 kg)   Body Composition  Body Fat %: 43.1 % Fat Mass (lbs): 85.2 lbs Muscle Mass (lbs): 106.8 lbs Total Body Water (lbs): 74.6 lbs Visceral Fat Rating : 12   Other Clinical Data Today's Visit #: 26 Starting Date: 06/24/21     ASSESSMENT AND PLAN:  Diet: Krista Cooper is currently in the action stage of change. As such, her goal is to  continue with weight loss efforts. She has agreed to Category 2 Plan.  Exercise: Krista Cooper has been instructed that some exercise is better than none for weight loss and overall health benefits.   Behavior Modification:  We discussed the following Behavioral Modification Strategies today: increasing lean protein intake, increasing vegetables, no skipping meals, meal planning and cooking strategies, and better snacking choices.   No follow-ups on file.Krista Cooper She was informed of the importance of frequent follow up visits to maximize her success with intensive lifestyle modifications for her multiple health conditions.  Attestation Statements:   Reviewed by clinician on day of visit: allergies, medications, problem list, medical history, surgical history, family history, social history, and previous encounter notes.     Krista Cho, MD

## 2023-05-31 NOTE — Assessment & Plan Note (Signed)
 On Ozempic 1mg  weekly now.  She has not gone completely up to 1mg  dose yet (taking 0.75mg ).  She has some GI uneasiness the first day after her medication but otherwise is doing well on this dose.  She does not need refill of her medication at this time.

## 2023-05-31 NOTE — Assessment & Plan Note (Signed)
 On rosuvastatin 20mg  daily.  Switched from atorvastatin after last labs.  Repeat labs in [place] with PCP.

## 2023-06-19 DIAGNOSIS — E119 Type 2 diabetes mellitus without complications: Secondary | ICD-10-CM | POA: Diagnosis not present

## 2023-06-22 ENCOUNTER — Other Ambulatory Visit: Payer: Self-pay | Admitting: Family Medicine

## 2023-06-28 ENCOUNTER — Telehealth (INDEPENDENT_AMBULATORY_CARE_PROVIDER_SITE_OTHER): Payer: Self-pay

## 2023-06-28 ENCOUNTER — Ambulatory Visit (INDEPENDENT_AMBULATORY_CARE_PROVIDER_SITE_OTHER): Payer: Federal, State, Local not specified - PPO | Admitting: Family Medicine

## 2023-06-28 ENCOUNTER — Encounter (INDEPENDENT_AMBULATORY_CARE_PROVIDER_SITE_OTHER): Payer: Self-pay | Admitting: Family Medicine

## 2023-06-28 ENCOUNTER — Ambulatory Visit (INDEPENDENT_AMBULATORY_CARE_PROVIDER_SITE_OTHER): Payer: Medicare Other | Admitting: Family Medicine

## 2023-06-28 VITALS — BP 128/71 | HR 84 | Temp 98.1°F | Ht 66.0 in | Wt 198.0 lb

## 2023-06-28 DIAGNOSIS — E1169 Type 2 diabetes mellitus with other specified complication: Secondary | ICD-10-CM

## 2023-06-28 DIAGNOSIS — Z7985 Long-term (current) use of injectable non-insulin antidiabetic drugs: Secondary | ICD-10-CM

## 2023-06-28 DIAGNOSIS — E785 Hyperlipidemia, unspecified: Secondary | ICD-10-CM

## 2023-06-28 DIAGNOSIS — E66811 Obesity, class 1: Secondary | ICD-10-CM

## 2023-06-28 DIAGNOSIS — E1165 Type 2 diabetes mellitus with hyperglycemia: Secondary | ICD-10-CM

## 2023-06-28 DIAGNOSIS — Z6831 Body mass index (BMI) 31.0-31.9, adult: Secondary | ICD-10-CM

## 2023-06-28 DIAGNOSIS — Z6832 Body mass index (BMI) 32.0-32.9, adult: Secondary | ICD-10-CM

## 2023-06-28 MED ORDER — SEMAGLUTIDE (1 MG/DOSE) 4 MG/3ML ~~LOC~~ SOPN: 1.0000 mg | PEN_INJECTOR | SUBCUTANEOUS | 0 refills | Status: DC

## 2023-06-28 NOTE — Assessment & Plan Note (Signed)
 On Ozempic  0.75mg  weekly. She is not experiencing any GI side effects of the medication.  She is not able to get all food in consistently so no need to increase to full 1mg  at this time.  Needs refill of Ozempic  today.

## 2023-06-28 NOTE — Assessment & Plan Note (Addendum)
Starting weight: 202 Peak weight: 239 BMR: 2117 Previous obesity management: GLP-1 Ozempic- not much weight loss and A1c increased Body Fat %: 42.1% Starting Meal Plan: Category 2  Patient working on consistency of her total intake of protein and calories daily.  Will continue with category 2 and see how much patient is able to tolerate now that she is switching over to [facility] from Ozempic.

## 2023-06-28 NOTE — Telephone Encounter (Signed)
 PA started for Ozempic

## 2023-06-28 NOTE — Progress Notes (Signed)
SUBJECTIVE:  Chief Complaint: Obesity  Interim History: Over the last month patient has been doing more consistent physical activity (doing resistance bands 2-4 times a week).  She feels she is less motivated recently.  She feels better when she is consistent with her activity and food intake.  Over the las few weeks she has been trying to follow the meal plan and sometimes she cannot get everything in.  Planning a trip to see her cousin in [place] in the upcoming few weeks.  Krista Cooper is here to discuss her progress with her obesity treatment plan. She is on the Category 2 Plan and states she is following her eating plan approximately 85-88 % of the time. She states she is walking and 30 minutes 7 times per week and exercising 2-4 times per week.   OBJECTIVE: Visit Diagnoses: Problem List Items Addressed This Visit       Endocrine   Hyperlipidemia associated with type 2 diabetes mellitus (HCC)   No reported side effects on atorvastatin that was started after last lab draw.  Will need repeat labs in the next 2 months.      Relevant Medications   tirzepatide (MOUNJARO) 2.5 MG/0.5ML Pen   T2DM (type 2 diabetes mellitus) (HCC) - Primary   On Ozempic 0.75mg  weekly. She is not experiencing any GI side effects of the medication.  She is not able to get all food in consistently so no need to increase to full 1mg  at this time.  Needs refill of Ozempic today.      Relevant Medications   tirzepatide (MOUNJARO) 2.5 MG/0.5ML Pen     Other   Class 1 obesity with serious comorbidity and body mass index (BMI) of 32.0 to 32.9 in adult   Starting weight: 202 Peak weight: 239 BMR: 2117 Previous obesity management: GLP-1 Ozempic- not much weight loss and A1c increased Body Fat %: 42.1% Starting Meal Plan: Category 2  Patient working on consistency of her total intake of protein and calories daily.  Will continue with category 2 and see how much patient is able to tolerate now that she is  switching over to [facility] from Ozempic.        Relevant Medications   tirzepatide (MOUNJARO) 2.5 MG/0.5ML Pen    Vitals Temp: 98.1 F (36.7 C) BP: 128/71 Pulse Rate: 84 SpO2: 99 %   Anthropometric Measurements Height: 5\' 6"  (1.676 m) Weight: 198 lb (89.8 kg) BMI (Calculated): 31.97 Weight at Last Visit: 197 lb Weight Lost Since Last Visit: 0 Weight Gained Since Last Visit: 1 Starting Weight: 202 lb Total Weight Loss (lbs): 4 lb (1.814 kg)   Body Composition  Body Fat %: 42.1 % Fat Mass (lbs): 83.4 lbs Muscle Mass (lbs): 108.8 lbs Total Body Water (lbs): 74.4 lbs Visceral Fat Rating : 12   Other Clinical Data Today's Visit #: 82 Starting Date: 06/24/21     ASSESSMENT AND PLAN:  Diet: Krista Cooper is currently in the action stage of change. As such, her goal is to continue with weight loss efforts. She has agreed to Category 2 Plan.  Exercise: Krista Cooper has been instructed to work up to a goal of 150 minutes of combined cardio and strengthening exercise per week and that some exercise is better than none for weight loss and overall health benefits.   Behavior Modification:  We discussed the following Behavioral Modification Strategies today: increasing lean protein intake, increasing vegetables, meal planning and cooking strategies, and keeping healthy foods in the home.  No follow-ups on file.Marland Kitchen She was informed of the importance of frequent follow up visits to maximize her success with intensive lifestyle modifications for her multiple health conditions.  Attestation Statements:   Reviewed by clinician on day of visit: allergies, medications, problem list, medical history, surgical history, family history, social history, and previous encounter notes.    Krista Likes, MD

## 2023-06-29 ENCOUNTER — Encounter (INDEPENDENT_AMBULATORY_CARE_PROVIDER_SITE_OTHER): Payer: Self-pay

## 2023-06-29 DIAGNOSIS — E1165 Type 2 diabetes mellitus with hyperglycemia: Secondary | ICD-10-CM | POA: Insufficient documentation

## 2023-06-29 MED ORDER — TIRZEPATIDE 2.5 MG/0.5ML ~~LOC~~ SOAJ: 2.5000 mg | SUBCUTANEOUS | 0 refills | Status: DC

## 2023-06-29 NOTE — Assessment & Plan Note (Signed)
Recent increase in A1c from 5.9-6.4.  Patient has been trying to stay consistent on her meal plan during that time.  She has been on Ozempic with no appreciable GI side effects.  Patient's insurance will no longer cover Ozempic given the increase in her A1c.  Patient will start Mounjaro at 2.5 mg weekly and see if that has better success at lowering her A1c and blood sugars.

## 2023-06-29 NOTE — Assessment & Plan Note (Signed)
No reported side effects on atorvastatin that was started after last lab draw.  Will need repeat labs in the next 2 months.

## 2023-06-30 NOTE — Telephone Encounter (Signed)
PA for Ozempic was Denied. Dr Marquis Lunch sent in [place] 2.5 mg which was approved by [employer]. Pt aware via phone call. CS

## 2023-07-17 DIAGNOSIS — E119 Type 2 diabetes mellitus without complications: Secondary | ICD-10-CM | POA: Diagnosis not present

## 2023-07-22 ENCOUNTER — Ambulatory Visit (INDEPENDENT_AMBULATORY_CARE_PROVIDER_SITE_OTHER): Payer: Federal, State, Local not specified - PPO | Admitting: Family Medicine

## 2023-07-27 ENCOUNTER — Ambulatory Visit (INDEPENDENT_AMBULATORY_CARE_PROVIDER_SITE_OTHER): Payer: Medicare Other | Admitting: Family Medicine

## 2023-07-27 VITALS — BP 135/75 | HR 92 | Temp 98.2°F | Ht 66.0 in | Wt 198.0 lb

## 2023-07-27 DIAGNOSIS — E669 Obesity, unspecified: Secondary | ICD-10-CM

## 2023-07-27 DIAGNOSIS — E1159 Type 2 diabetes mellitus with other circulatory complications: Secondary | ICD-10-CM

## 2023-07-27 DIAGNOSIS — E66811 Obesity, class 1: Secondary | ICD-10-CM | POA: Diagnosis not present

## 2023-07-27 DIAGNOSIS — Z6832 Body mass index (BMI) 32.0-32.9, adult: Secondary | ICD-10-CM

## 2023-07-27 DIAGNOSIS — I152 Hypertension secondary to endocrine disorders: Secondary | ICD-10-CM

## 2023-07-27 DIAGNOSIS — E1165 Type 2 diabetes mellitus with hyperglycemia: Secondary | ICD-10-CM

## 2023-07-27 DIAGNOSIS — Z6831 Body mass index (BMI) 31.0-31.9, adult: Secondary | ICD-10-CM

## 2023-07-27 DIAGNOSIS — Z7985 Long-term (current) use of injectable non-insulin antidiabetic drugs: Secondary | ICD-10-CM

## 2023-07-27 MED ORDER — TIRZEPATIDE 5 MG/0.5ML ~~LOC~~ SOAJ: 5.0000 mg | SUBCUTANEOUS | 0 refills | Status: DC

## 2023-07-27 NOTE — Progress Notes (Signed)
 SUBJECTIVE:  Chief Complaint: Obesity  Interim History: Patient has been walking everyday and doing aerobics once a week and yoga once weekly as well.  She mentions the last two weeks she has gotten somewhat lazy on exercise.  She remembers she needs consistency.  She feels good on Mounjaro and doesn't have back pain on this medication anymore. She has been eating protein most of the time- sometimes not getting all protein in daily. She wants to work on this and get more consistent protein in.   Tommye is here to discuss her progress with her obesity treatment plan. She is on the Category 2 Plan and states she is following her eating plan approximately 85 % of the time. She states she is exercising 30-60 minutes 7 times per week.   OBJECTIVE: Visit Diagnoses: Problem List Items Addressed This Visit       Cardiovascular and Mediastinum   Hypertension associated with diabetes (HCC)   Blood pressure well controlled today.  No chest pain, chest pressure or headache.  No refills of her medication necessary.      Relevant Medications   tirzepatide (MOUNJARO) 5 MG/0.5ML Pen     Endocrine   Type 2 diabetes mellitus with hyperglycemia (HCC) - Primary   Patient is able to get all food in and feels better on the [facility] than she did on Ozempic.  Needs a refill of her Greggory Keen today and is open to changing dose of her medication.  Will increase Mounjaro to 5mg  and see how she is at next appointment in terms of ability to get all food in.      Relevant Medications   tirzepatide (MOUNJARO) 5 MG/0.5ML Pen     Other   Class 1 obesity with serious comorbidity and body mass index (BMI) of 32.0 to 32.9 in adult   Patient doing well following meal plan.  On mounjaro now.  See anthropometric data.  Continue Category 2.      Relevant Medications   tirzepatide (MOUNJARO) 5 MG/0.5ML Pen   Other Visit Diagnoses       Generalized obesity- START BMI 32.6       Relevant Medications   tirzepatide  (MOUNJARO) 5 MG/0.5ML Pen     BMI 31.0-31.9,adult           Vitals Temp: 98.2 F (36.8 C) BP: 135/75 Pulse Rate: 92 SpO2: 99 %   Anthropometric Measurements Height: 5\' 6"  (1.676 m) Weight: 198 lb (89.8 kg) BMI (Calculated): 31.97 Weight at Last Visit: 198 lb Weight Lost Since Last Visit: 0 Weight Gained Since Last Visit: 0 Starting Weight: 202 lb Total Weight Loss (lbs): 4 lb (1.814 kg)   No data recorded Other Clinical Data Today's Visit #: 28 Starting Date: 06/24/21     ASSESSMENT AND PLAN:  Diet: Lakindra is currently in the action stage of change. As such, her goal is to continue with weight loss efforts and has agreed to the Category 2 Plan.   Exercise:  Older adults should follow the adult guidelines. When older adults cannot meet the adult guidelines, they should be as physically active as their abilities and conditions will allow.  Behavior Modification:  We discussed the following Behavioral Modification Strategies today: increasing lean protein intake, increasing vegetables, meal planning and cooking strategies, and planning for success.   No follow-ups on file.Marland Kitchen She was informed of the importance of frequent follow up visits to maximize her success with intensive lifestyle modifications for her multiple health conditions.  Attestation Statements:  Reviewed by clinician on day of visit: allergies, medications, problem list, medical history, surgical history, family history, social history, and previous encounter notes.   Reuben Likes, MD

## 2023-07-27 NOTE — Assessment & Plan Note (Signed)
 Patient is able to get all food in and feels better on the [facility] than she did on Ozempic.  Needs a refill of her Greggory Keen today and is open to changing dose of her medication.  Will increase Mounjaro to 5mg  and see how she is at next appointment in terms of ability to get all food in.

## 2023-07-27 NOTE — Assessment & Plan Note (Signed)
 Blood pressure well controlled today.  No chest pain, chest pressure or headache.  No refills of her medication necessary.

## 2023-07-27 NOTE — Assessment & Plan Note (Signed)
 Patient doing well following meal plan.  On mounjaro now.  See anthropometric data.  Continue Category 2.

## 2023-08-17 DIAGNOSIS — E119 Type 2 diabetes mellitus without complications: Secondary | ICD-10-CM | POA: Diagnosis not present

## 2023-08-19 ENCOUNTER — Other Ambulatory Visit: Payer: Self-pay | Admitting: Family Medicine

## 2023-08-20 NOTE — Telephone Encounter (Signed)
 Requested Prescriptions  Pending Prescriptions Disp Refills   sertraline (ZOLOFT) 50 MG tablet [Pharmacy Med Name: Sertraline HCl 50 MG Oral Tablet] 30 tablet 0    Sig: Take 1 tablet by mouth once daily     Psychiatry:  Antidepressants - SSRI - sertraline Failed - 08/20/2023 10:30 AM      Failed - Valid encounter within last 6 months    Recent Outpatient Visits   None     Future Appointments             In 2 weeks Bacigalupo, [name], MD [facility]            Passed - AST in normal range and within 360 days    AST  Date Value Ref Range Status  03/12/2023 34 0 - 40 IU/L Final         Passed - ALT in normal range and within 360 days    ALT  Date Value Ref Range Status  03/12/2023 28 0 - 32 IU/L Final         Passed - Completed PHQ-2 or PHQ-9 in the last 360 days

## 2023-08-24 ENCOUNTER — Encounter (INDEPENDENT_AMBULATORY_CARE_PROVIDER_SITE_OTHER): Payer: Self-pay | Admitting: Family Medicine

## 2023-08-24 ENCOUNTER — Ambulatory Visit (INDEPENDENT_AMBULATORY_CARE_PROVIDER_SITE_OTHER): Admitting: Family Medicine

## 2023-08-24 VITALS — BP 129/73 | HR 84 | Temp 98.1°F | Ht 66.0 in | Wt 195.0 lb

## 2023-08-24 DIAGNOSIS — E66811 Obesity, class 1: Secondary | ICD-10-CM

## 2023-08-24 DIAGNOSIS — E1169 Type 2 diabetes mellitus with other specified complication: Secondary | ICD-10-CM

## 2023-08-24 DIAGNOSIS — E785 Hyperlipidemia, unspecified: Secondary | ICD-10-CM | POA: Diagnosis not present

## 2023-08-24 DIAGNOSIS — Z6831 Body mass index (BMI) 31.0-31.9, adult: Secondary | ICD-10-CM | POA: Diagnosis not present

## 2023-08-24 DIAGNOSIS — Z7985 Long-term (current) use of injectable non-insulin antidiabetic drugs: Secondary | ICD-10-CM

## 2023-08-24 DIAGNOSIS — E1165 Type 2 diabetes mellitus with hyperglycemia: Secondary | ICD-10-CM

## 2023-08-24 MED ORDER — TIRZEPATIDE 5 MG/0.5ML ~~LOC~~ SOAJ: 5.0000 mg | SUBCUTANEOUS | 1 refills | Status: DC

## 2023-08-24 NOTE — Progress Notes (Signed)
   SUBJECTIVE:  Chief Complaint: Obesity  Interim History: Patient has been doing more exercising with walking about 6 days a week instead of 7.  She has been painting her deck and now is starting to pain the other decks around her home.  She has been going to yoga and aerobics and if she can't go will do the bands.  She is getting full more quickly and she gets appropriate hunger.  She is leaving for the beach Sunday for a couple of days with friends.  She is also hoping to find some seafood that she will get grilled.   Charis is here to discuss her progress with her obesity treatment plan. She is on the Category 2 Plan and states she is following her eating plan approximately 90 % of the time. She states she is exercising 60 minutes 4-6 times per week.   OBJECTIVE: Visit Diagnoses: Problem List Items Addressed This Visit       Endocrine   Hyperlipidemia associated with type 2 diabetes mellitus (HCC)   On crestor 20mg  daily.  Last LDL not at goal in the fall of 2024 and so crestor was increased.  Will follow up on labs that PCP orders at next appointment.      Relevant Medications   tirzepatide (MOUNJARO) 5 MG/0.5ML Pen   T2DM (type 2 diabetes mellitus) (HCC) - Primary   Relevant Medications   tirzepatide (MOUNJARO) 5 MG/0.5ML Pen     Other   Class 1 obesity with serious comorbidity and body mass index (BMI) of 32.0 to 32.9 in adult   Relevant Medications   tirzepatide (MOUNJARO) 5 MG/0.5ML Pen   Other Visit Diagnoses       BMI 31.0-31.9,adult           Vitals Temp: 98.1 F (36.7 C) BP: 129/73 Pulse Rate: 84 SpO2: 98 %   Anthropometric Measurements Height: 5\' 6"  (1.676 m) Weight: 195 lb (88.5 kg) BMI (Calculated): 31.49 Weight at Last Visit: 198 lb Weight Lost Since Last Visit: 3 Weight Gained Since Last Visit: 0 Starting Weight: 202 lb Total Weight Loss (lbs): 7 lb (3.175 kg)   Body Composition  Body Fat %: 41.7 % Fat Mass (lbs): 81.4 lbs Muscle Mass  (lbs): 108.2 lbs Total Body Water (lbs): 72.8 lbs Visceral Fat Rating : 12   Other Clinical Data Today's Visit #: 70 Starting Date: 06/24/21 Comments: Cat 2     ASSESSMENT AND PLAN:  Diet: Janylah is currently in the action stage of change. As such, her goal is to continue with weight loss efforts and has agreed to the Category 3 Plan and keeping a food journal and adhering to recommended goals of 1400-1500 calories and 95 or more grams protein daily.   Exercise:  Older adults should determine their level of effort for physical activity relative to their level of fitness.  Behavior Modification:  We discussed the following Behavioral Modification Strategies today: increasing lean protein intake, decreasing simple carbohydrates, meal planning and cooking strategies, keeping healthy foods in the home, and planning for success.  Return in about 5 weeks (around 09/28/2023).Marland Kitchen She was informed of the importance of frequent follow up visits to maximize her success with intensive lifestyle modifications for her multiple health conditions.  Attestation Statements:   Reviewed by clinician on day of visit: allergies, medications, problem list, medical history, surgical history, family history, social history, and previous encounter notes.    Reuben Likes, MD

## 2023-08-24 NOTE — Assessment & Plan Note (Signed)
 Doing well on Mounjaro with no GI side effects.  Blood sugars well controlled.  Needs refill today.  Defer to PCP at chronic disease follow up for lab evaluation.

## 2023-08-24 NOTE — Assessment & Plan Note (Signed)
 On crestor 20mg  daily.  Last LDL not at goal in the fall of 2024 and so crestor was increased.  Will follow up on labs that PCP orders at next appointment.

## 2023-09-07 ENCOUNTER — Ambulatory Visit (INDEPENDENT_AMBULATORY_CARE_PROVIDER_SITE_OTHER): Payer: Self-pay | Admitting: Family Medicine

## 2023-09-07 ENCOUNTER — Encounter: Payer: Self-pay | Admitting: Family Medicine

## 2023-09-07 VITALS — BP 128/82 | HR 87 | Ht 66.0 in | Wt 198.0 lb

## 2023-09-07 DIAGNOSIS — E1165 Type 2 diabetes mellitus with hyperglycemia: Secondary | ICD-10-CM

## 2023-09-07 DIAGNOSIS — E66811 Obesity, class 1: Secondary | ICD-10-CM

## 2023-09-07 DIAGNOSIS — E785 Hyperlipidemia, unspecified: Secondary | ICD-10-CM

## 2023-09-07 DIAGNOSIS — I152 Hypertension secondary to endocrine disorders: Secondary | ICD-10-CM | POA: Diagnosis not present

## 2023-09-07 DIAGNOSIS — F419 Anxiety disorder, unspecified: Secondary | ICD-10-CM

## 2023-09-07 DIAGNOSIS — Z6832 Body mass index (BMI) 32.0-32.9, adult: Secondary | ICD-10-CM

## 2023-09-07 DIAGNOSIS — Z7985 Long-term (current) use of injectable non-insulin antidiabetic drugs: Secondary | ICD-10-CM

## 2023-09-07 DIAGNOSIS — E1159 Type 2 diabetes mellitus with other circulatory complications: Secondary | ICD-10-CM | POA: Diagnosis not present

## 2023-09-07 DIAGNOSIS — E1169 Type 2 diabetes mellitus with other specified complication: Secondary | ICD-10-CM

## 2023-09-07 MED ORDER — ROSUVASTATIN CALCIUM 20 MG PO TABS: 20.0000 mg | ORAL_TABLET | Freq: Every day | ORAL | 3 refills | Status: DC

## 2023-09-07 MED ORDER — SERTRALINE HCL 50 MG PO TABS: 50.0000 mg | ORAL_TABLET | Freq: Every day | ORAL | 3 refills | Status: AC

## 2023-09-07 MED ORDER — TRIAMTERENE-HCTZ 37.5-25 MG PO TABS: 1.0000 | ORAL_TABLET | Freq: Every day | ORAL | 3 refills | Status: DC

## 2023-09-07 NOTE — Assessment & Plan Note (Signed)
 Anxiety is well-managed with Zoloft  50 mg daily. Xanax  is used sparingly. Zoloft  is considered safe for long-term use and is effective for anxiety management. Tapering off Zoloft  would require a gradual reduction due to its effects on brain chemicals, but it is not addictive. - Continue Zoloft  50 mg daily - Refill Zoloft  prescription - Continue Xanax  0.25 mg bid prn for anxiety

## 2023-09-07 NOTE — Assessment & Plan Note (Signed)
 Hyperlipidemia is managed with Crestor  20 mg daily. Discussion about coronary calcium  score CT scan to assess plaque burden, though current management with high-dose statin is appropriate. The scan is not covered by insurance and costs $150 out of pocket. It may help risk stratify for heart disease and stroke, but current treatment with statin is already optimal. - Continue Crestor  20 mg daily - Consider coronary calcium  score CT scan if desired - Order cholesterol test

## 2023-09-07 NOTE — Progress Notes (Signed)
 Established patient visit   Patient: Krista Cooper   DOB: 03-12-1956   68 y.o. Female  MRN: [id number] Visit Date: 09/07/2023  Today's healthcare provider: Aden Agreste, MD   Chief Complaint  Patient presents with   Medical Management of Chronic Issues   Hyperlipidemia   Hypertension    Pt monitors on occasion. No symptoms to report   Diabetes    Pt does monitor at home Average BS 115 mg/dL Lowest 829, highest 562 in the last 30 days, lowest 104 and highest 139 in the last 90 days Eye 03/2023 Currently using 5 mg mounjaro x 1 month   Subjective    HPI HPI     Hypertension    Additional comments: Pt monitors on occasion. No symptoms to report        Diabetes    Additional comments: Pt does monitor at home Average BS 115 mg/dL Lowest 130, highest 865 in the last 30 days, lowest 104 and highest 139 in the last 90 days Eye 03/2023 Currently using 5 mg mounjaro x 1 month      Last edited by Pasty Bongo, CMA on 09/07/2023  1:04 PM.       Discussed the use of AI scribe software for clinical note transcription with the patient, who gave verbal consent to proceed.  History of Present Illness   A 68 year old patient with a history of type two diabetes, hyperlipidemia, hypertension, anxiety, and insomnia presents for a chronic disease follow-up. The patient reports a positive experience with Mounjaro 5 mg weekly, noting fewer gastrointestinal issues compared to Ozempic . The patient has been on the 5 mg dose for a month. The patient reports struggling with weight management, citing inconsistency with exercise. The patient is trying to incorporate more aerobic exercises and weights into her routine. The patient's blood pressure has been well-controlled, and she reports an improvement since starting Ozempic  or Mounjaro. The patient expresses interest in a plaque test due to her family history, although it is unclear what specific test she is referring to. The  patient is up-to-date on her vaccinations, including the flu shot, COVID-19 vaccine, and RSV vaccine. The patient has been on Zoloft  50 mg daily for anxiety and insomnia and reports good sleep and manageable anxiety levels. The patient uses Xanax  0.25 mg bid prn sparingly for anxiety.        .    09/07/2023    1:04 PM  GAD 7 : Generalized Anxiety Score  Nervous, Anxious, on Edge 0  Control/stop worrying 0  Worry too much - different things 0  Trouble relaxing 0  Restless 0  Easily annoyed or irritable 0  Afraid - awful might happen 0  Total GAD 7 Score 0  Anxiety Difficulty Not difficult at all      Medications: Outpatient Medications Prior to Visit  Medication Sig   ALPRAZolam  (XANAX ) 0.25 MG tablet Take 1 tablet (0.25 mg total) by mouth 2 (two) times daily as needed for anxiety.   Cholecalciferol (VITAMIN D3) 125 MCG (5000 UT) CAPS Take 1 capsule (5,000 Units total) by mouth daily.   Coenzyme Q10 (COQ10) 200 MG CAPS Take 200 mg by mouth daily with breakfast.   fluticasone  (FLONASE ) 50 MCG/ACT nasal spray Place 2 sprays into both nostrils daily.   Insulin  Pen Needle (BD PEN NEEDLE NANO 2ND GEN) 32G X 4 MM MISC 1 Package by Does not apply route 2 (two) times daily.   levocetirizine (XYZAL ) 5 MG tablet TAKE  1 TABLET BY MOUTH ONCE DAILY IN THE EVENING   MAGNESIUM GLYCINATE PO Take 240 mg by mouth.   MULTIPLE VITAMIN PO Take 1 tablet by mouth daily.   Omega-3 Fatty Acids (FISH OIL ) 1200 MG CAPS Take 2 capsules (2,400 mg total) by mouth daily.   Psyllium Fiber 0.52 g CAPS Take by mouth.   tirzepatide (MOUNJARO) 5 MG/0.5ML Pen Inject 5 mg into the skin once a week.   [DISCONTINUED] rosuvastatin  (CRESTOR ) 20 MG tablet Take 1 tablet (20 mg total) by mouth daily.   [DISCONTINUED] sertraline  (ZOLOFT ) 50 MG tablet Take 1 tablet by mouth once daily   [DISCONTINUED] triamterene -hydrochlorothiazide (MAXZIDE-25) 37.5-25 MG tablet Take 1 tablet by mouth once daily   No facility-administered  medications prior to visit.    Review of Systems     Objective    BP 128/82 (BP Location: Left Arm, Patient Position: Sitting, Cuff Size: Large)   Pulse 87   Ht 5\' 6"  (1.676 m)   Wt 198 lb (89.8 kg)   SpO2 99%   BMI 31.96 kg/m    Physical Exam Vitals reviewed.  Constitutional:      General: She is not in acute distress.    Appearance: Normal appearance. She is well-developed. She is not diaphoretic.  HENT:     Head: Normocephalic and atraumatic.  Eyes:     General: No scleral icterus.    Conjunctiva/sclera: Conjunctivae normal.  Neck:     Thyroid : No thyromegaly.  Cardiovascular:     Rate and Rhythm: Normal rate and regular rhythm.     Heart sounds: Normal heart sounds. No murmur heard. Pulmonary:     Effort: Pulmonary effort is normal. No respiratory distress.     Breath sounds: Normal breath sounds. No wheezing, rhonchi or rales.  Musculoskeletal:     Cervical back: Neck supple.     Right lower leg: No edema.     Left lower leg: No edema.  Lymphadenopathy:     Cervical: No cervical adenopathy.  Skin:    General: Skin is warm and dry.     Findings: No rash.  Neurological:     Mental Status: She is alert and oriented to person, place, and time. Mental status is at baseline.  Psychiatric:        Mood and Affect: Mood normal.        Behavior: Behavior normal.      No results found for any visits on 09/07/23.  Assessment & Plan     Problem List Items Addressed This Visit       Cardiovascular and Mediastinum   Hypertension associated with diabetes (HCC) - Primary   Blood pressure was elevated upon arrival but decreased to 128/82 mmHg after settling. Blood pressure has been well-controlled recently, typically around 120/70 mmHg. Mounjaro may have contributed to improved blood pressure control. - Continue Maxzide 25 mg daily      Relevant Medications   rosuvastatin  (CRESTOR ) 20 MG tablet   triamterene -hydrochlorothiazide (MAXZIDE-25) 37.5-25 MG tablet    Other Relevant Orders   Comprehensive metabolic panel with GFR     Endocrine   Hyperlipidemia associated with type 2 diabetes mellitus (HCC)   Hyperlipidemia is managed with Crestor  20 mg daily. Discussion about coronary calcium  score CT scan to assess plaque burden, though current management with high-dose statin is appropriate. The scan is not covered by insurance and costs $150 out of pocket. It may help risk stratify for heart disease and stroke, but current treatment with statin  is already optimal. - Continue Crestor  20 mg daily - Consider coronary calcium  score CT scan if desired - Order cholesterol test      Relevant Medications   rosuvastatin  (CRESTOR ) 20 MG tablet   triamterene -hydrochlorothiazide (MAXZIDE-25) 37.5-25 MG tablet   Other Relevant Orders   Comprehensive metabolic panel with GFR   Lipid panel   T2DM (type 2 diabetes mellitus) (HCC)   Diabetes management is ongoing with Mounjaro 5 mg weekly. Weight has decreased from 198 lbs to 195 lbs. She is working on increasing exercise consistency to aid weight management. Mounjaro is preferred over Ozempic  due to fewer gastrointestinal side effects. - Continue Mounjaro 5 mg weekly - Encourage consistent exercise, aiming for four days of aerobics or weights in addition to walking - Order A1c test - Perform foot exam      Relevant Medications   rosuvastatin  (CRESTOR ) 20 MG tablet   Other Relevant Orders   Hemoglobin A1c   Microalbumin / creatinine urine ratio     Other   Anxiety   Anxiety is well-managed with Zoloft  50 mg daily. Xanax  is used sparingly. Zoloft  is considered safe for long-term use and is effective for anxiety management. Tapering off Zoloft  would require a gradual reduction due to its effects on brain chemicals, but it is not addictive. - Continue Zoloft  50 mg daily - Refill Zoloft  prescription - Continue Xanax  0.25 mg bid prn for anxiety      Relevant Medications   sertraline  (ZOLOFT ) 50 MG tablet    Class 1 obesity with serious comorbidity and body mass index (BMI) of 32.0 to 32.9 in adult        General Health Maintenance She is up to date on vaccinations, including flu, COVID, and RSV. No vaccines are currently due. Colonoscopy is due in 2029, and tetanus in 2027. Mammogram is due in August. - Ensure mammogram is scheduled for August        Return in about 6 months (around 03/08/2024) for CPE.       Aden Agreste, MD  [facility] [phone] (phone) [phone] (fax)  [facility]

## 2023-09-07 NOTE — Assessment & Plan Note (Signed)
 Diabetes management is ongoing with Mounjaro 5 mg weekly. Weight has decreased from 198 lbs to 195 lbs. She is working on increasing exercise consistency to aid weight management. Mounjaro is preferred over Ozempic  due to fewer gastrointestinal side effects. - Continue Mounjaro 5 mg weekly - Encourage consistent exercise, aiming for four days of aerobics or weights in addition to walking - Order A1c test - Perform foot exam

## 2023-09-07 NOTE — Assessment & Plan Note (Signed)
 Blood pressure was elevated upon arrival but decreased to 128/82 mmHg after settling. Blood pressure has been well-controlled recently, typically around 120/70 mmHg. Mounjaro may have contributed to improved blood pressure control. - Continue Maxzide 25 mg daily

## 2023-09-08 LAB — COMPREHENSIVE METABOLIC PANEL WITH GFR
ALT: 30 IU/L (ref 0–32)
AST: 32 IU/L (ref 0–40)
Albumin: 4.4 g/dL (ref 3.9–4.9)
Alkaline Phosphatase: 91 IU/L (ref 44–121)
BUN/Creatinine Ratio: 43 — ABNORMAL HIGH (ref 12–28)
BUN: 24 mg/dL (ref 8–27)
Bilirubin Total: 0.3 mg/dL (ref 0.0–1.2)
CO2: 25 mmol/L (ref 20–29)
Calcium: 9.8 mg/dL (ref 8.7–10.3)
Chloride: 92 mmol/L — ABNORMAL LOW (ref 96–106)
Creatinine, Ser: 0.56 mg/dL — ABNORMAL LOW (ref 0.57–1.00)
Globulin, Total: 2 g/dL (ref 1.5–4.5)
Glucose: 94 mg/dL (ref 70–99)
Potassium: 4.1 mmol/L (ref 3.5–5.2)
Sodium: 129 mmol/L — ABNORMAL LOW (ref 134–144)
Total Protein: 6.4 g/dL (ref 6.0–8.5)
eGFR: 100 mL/min/{1.73_m2} (ref >59)

## 2023-09-08 LAB — LIPID PANEL
Chol/HDL Ratio: 2.9 ratio (ref 0.0–4.4)
Cholesterol, Total: 183 mg/dL (ref 100–199)
HDL: 64 mg/dL (ref >39)
LDL Chol Calc (NIH): 100 mg/dL — ABNORMAL HIGH (ref 0–99)
Triglycerides: 107 mg/dL (ref 0–149)
VLDL Cholesterol Cal: 19 mg/dL (ref 5–40)

## 2023-09-08 LAB — MICROALBUMIN / CREATININE URINE RATIO
Creatinine, Urine: 20.5 mg/dL
Microalb/Creat Ratio: <15 mg/g{creat} (ref 0–29)
Microalbumin, Urine: <3 ug/mL

## 2023-09-08 LAB — HEMOGLOBIN A1C
Est. average glucose Bld gHb Est-mCnc: 128 mg/dL
Hgb A1c MFr Bld: 6.1 % — ABNORMAL HIGH (ref 4.8–5.6)

## 2023-09-09 ENCOUNTER — Encounter: Payer: Self-pay | Admitting: Family Medicine

## 2023-09-09 ENCOUNTER — Other Ambulatory Visit: Payer: Self-pay

## 2023-09-09 DIAGNOSIS — E870 Hyperosmolality and hypernatremia: Secondary | ICD-10-CM

## 2023-09-09 MED ORDER — AMLODIPINE BESYLATE 10 MG PO TABS: 10.0000 mg | ORAL_TABLET | Freq: Every day | ORAL | 1 refills | Status: DC

## 2023-09-09 NOTE — Progress Notes (Signed)
 Prescription sent in and lab ordered. Patient reports that she is going on a Cruise and is coming back until 05/30.

## 2023-09-10 DIAGNOSIS — J069 Acute upper respiratory infection, unspecified: Secondary | ICD-10-CM | POA: Diagnosis not present

## 2023-09-10 DIAGNOSIS — H90A31 Mixed conductive and sensorineural hearing loss, unilateral, right ear with restricted hearing on the contralateral side: Secondary | ICD-10-CM | POA: Diagnosis not present

## 2023-09-16 DIAGNOSIS — E119 Type 2 diabetes mellitus without complications: Secondary | ICD-10-CM | POA: Diagnosis not present

## 2023-09-24 ENCOUNTER — Other Ambulatory Visit: Payer: Self-pay | Admitting: Family Medicine

## 2023-09-24 DIAGNOSIS — F419 Anxiety disorder, unspecified: Secondary | ICD-10-CM

## 2023-09-24 DIAGNOSIS — J302 Other seasonal allergic rhinitis: Secondary | ICD-10-CM

## 2023-09-27 NOTE — Telephone Encounter (Signed)
 Requested Prescriptions  Pending Prescriptions Disp Refills   fluticasone  (FLONASE ) 50 MCG/ACT nasal spray [Pharmacy Med Name: Fluticasone  Propionate 50 MCG/ACT Nasal Suspension] 16 g 5    Sig: Use 2 spray(s) in each nostril once daily     Ear, Nose, and Throat: Nasal Preparations - Corticosteroids Passed - 09/27/2023  3:10 PM      Passed - Valid encounter within last 12 months    Recent Outpatient Visits           2 weeks ago Hypertension associated with diabetes [facility])   [facility] [place], Stan Eans, MD

## 2023-09-30 ENCOUNTER — Telehealth: Payer: Self-pay

## 2023-09-30 ENCOUNTER — Ambulatory Visit (INDEPENDENT_AMBULATORY_CARE_PROVIDER_SITE_OTHER): Admitting: Family Medicine

## 2023-09-30 ENCOUNTER — Encounter (INDEPENDENT_AMBULATORY_CARE_PROVIDER_SITE_OTHER): Payer: Self-pay | Admitting: Family Medicine

## 2023-09-30 VITALS — BP 138/75 | HR 84 | Temp 98.3°F | Ht 66.0 in | Wt 194.0 lb

## 2023-09-30 DIAGNOSIS — Z6831 Body mass index (BMI) 31.0-31.9, adult: Secondary | ICD-10-CM

## 2023-09-30 DIAGNOSIS — E1165 Type 2 diabetes mellitus with hyperglycemia: Secondary | ICD-10-CM | POA: Diagnosis not present

## 2023-09-30 DIAGNOSIS — E1159 Type 2 diabetes mellitus with other circulatory complications: Secondary | ICD-10-CM

## 2023-09-30 DIAGNOSIS — Z7985 Long-term (current) use of injectable non-insulin antidiabetic drugs: Secondary | ICD-10-CM

## 2023-09-30 DIAGNOSIS — I152 Hypertension secondary to endocrine disorders: Secondary | ICD-10-CM

## 2023-09-30 DIAGNOSIS — E785 Hyperlipidemia, unspecified: Secondary | ICD-10-CM | POA: Diagnosis not present

## 2023-09-30 DIAGNOSIS — E669 Obesity, unspecified: Secondary | ICD-10-CM

## 2023-09-30 DIAGNOSIS — E1169 Type 2 diabetes mellitus with other specified complication: Secondary | ICD-10-CM

## 2023-09-30 DIAGNOSIS — E870 Hyperosmolality and hypernatremia: Secondary | ICD-10-CM | POA: Diagnosis not present

## 2023-09-30 NOTE — Telephone Encounter (Signed)
 Copied from CRM [id number]. Topic: Clinical - Request for Lab/Test Order >> Sep 30, 2023  2:02 PM Donald Frost wrote: Reason for CRM: The patient called in stating she has been out of town at [employer] and she is wanting to come in and get her updated labs done to check her sodium levels. She will be in today or tomorrow to get them done.

## 2023-09-30 NOTE — Telephone Encounter (Signed)
 Yes, needs BMP for her abnormal sodium. No fasting necessary

## 2023-09-30 NOTE — Progress Notes (Signed)
 Krista Cooper, D.O.  ABFM, ABOM Specializing in Clinical Bariatric Medicine  Office located at: [address] [place], [place]  [address]   Assessment and Plan:   Medications Discontinued During This Encounter  Medication Reason   triamterene -hydrochlorothiazide (MAXZIDE-25) 37.5-25 MG tablet Discontinued by provider    FOR THE DISEASE OF OBESITY:  BMI 31.0-31.9,adult - current BMI 31.33 Generalized obesity- START BMI 32.6 Assessment & Plan: Since last office visit on 08/24/2023 patient's  Muscle mass has decreased by 3.6 lb. Fat mass has increased by 2.8 lb. Total body water has increased by 4.8 lb.  Counseling done on how various foods will affect these numbers and how to maximize success  Total lbs lost to date: 8 lbs  Total weight loss percentage to date: 3.96%    Recommended Dietary Goals Krista Cooper is currently in the action stage of change. As such, her goal is to continue weight management plan.  She has agreed to: continue current plan   Behavioral Intervention We discussed the following today: increasing lean protein intake to established goals and work on tracking and journaling calories using tracking application  Additional resources provided today: Handout on Daily Food Journaling Log and Handout on Common Characteristics of Successful Weight Losers,  Evidence-based interventions for health behavior change were utilized today including the discussion of self monitoring techniques, problem-solving barriers and SMART goal setting techniques.   Regarding patient's less desirable eating habits and patterns, we employed the technique of small changes.   Pt will specifically work on journaling her intake daily.    Recommended Physical Activity Goals Krista Cooper has been advised to work up to 300-450 minutes of moderate intensity aerobic activity a week and strengthening exercises 2-3 times per week for cardiovascular health, weight loss maintenance and preservation  of muscle mass.   She has agreed to continue to gradually increase the amount and intensity of exercise routine   Pharmacotherapy We both agreed to continue same regimen.    ASSOCIATED CONDITIONS ADDRESSED TODAY:  Type 2 diabetes mellitus with hyperglycemia, without long-term current use of insulin  [facility]) Assessment & Plan: Most recent A1c: Lab Results  Component Value Date   HGBA1C 6.1 (H) 09/07/2023   HGBA1C 6.4 (H) 03/12/2023   HGBA1C 5.9 (A) 09/01/2022   Relevant medication: On Mounjaro 5 mg wkly with good compliance and tolerance.   HgbA1c is controlled for age and comorbid conditions. Denies symptoms of hypoglycemia or hyperglycemia. Her average fasting sugars the past 2 weeks: 111. Her hunger and cravings are controlled. Continue GLP-1 therapy and  reduced calorie meal plan low on processed crabs and simple sugars.    Hyperlipidemia associated with type 2 diabetes mellitus [facility]) Assessment & Plan: Most recent lipid panel: Lab Results  Component Value Date   CHOL 183 09/07/2023   HDL 64 09/07/2023   LDLCALC 100 (H) 09/07/2023   TRIG 107 09/07/2023   CHOLHDL 2.9 09/07/2023   The 10-year ASCVD risk score (Arnett DK, et al., 2019) is: 18%   Values used to calculate the score:     Age: 68 years     Sex: Female     Is Non-Hispanic African American: No     Diabetic: Yes     Tobacco smoker: No     Systolic Blood Pressure: 138 mmHg     Is BP treated: Yes     HDL Cholesterol: 64 mg/dL     Total Cholesterol: 183 mg/dL  Relevant medications: Crestor  20 mg daily & CoQ10 200 mg  daily.   LDL is not at goal. Recommended LDL goal is <70 for a diabetic to reduce the risk of fatty streaks and the progression to obstructive ASCVD in the future. States she eats 93% lean hamburger 2-3 times per week.   Limit red meat intake to no more than two days a week. Pt advised to reduce saturated and trans fats in diet. Recommend she speak with her PCP about doubling her Crestor  since her  LDL is not at goal. Recommend pt to explore the [facility] website for further information about high cholesterol. Continue to gradually increase the amount and intensity of exercise routine.    Hypertension associated with diabetes [facility]) Assessment & Plan: Last 3 blood pressure readings in our office are as follows: BP Readings from Last 3 Encounters:  09/30/23 138/75  09/07/23 128/82  08/24/23 129/73   Lab Results  Component Value Date   CREATININE 0.56 (L) 09/07/2023   BUN 24 09/07/2023   NA 129 (L) 09/07/2023   K 4.1 09/07/2023   CL 92 (L) 09/07/2023   CO2 25 09/07/2023   Relevant medication: Was recently switched from Maxide to Amlodipine  per her PCP d/t her low sodium.   Her BP is stable today. Her most recent BP was 119/74 during a house call visit, but she is not checking her BP on her own.   Encouraged pt to purchase the OMRON Series 3 BP monitor and check her BP at least 2-3x wkly. Also reminded pt to obtain the CMP ordered by her PCP on 09/09/2023 ASAP. Continue same regimen. Losing 10% of body weight may improve blood pressure control. We will continue to monitor closely alongside PCP.   Follow up:   Return 11/15/2023 on 10:20 AM with Krista Minus, MD. She was informed of the importance of frequent follow up visits to maximize her success with intensive lifestyle modifications for her multiple health conditions.  Subjective:   Chief complaint: Obesity Krista Cooper is here to discuss her progress with her obesity treatment plan. She is  keeping a food journal and adhering to recommended goals of 1400-1500 calories and 95 or more grams protein daily with the Category 2 meal plan as guide. She states she is walking or doing aerobics or yoga 30 minutes 6 days per week.  Interval History:  Krista Cooper is here for a follow up office visit. Pt typically followed by Dr.Ukleja. Since last OV on 08/24/2023, she is down 1 #. States she is following her eating plan approximately  88% of the time. Did not follow her MP during her 5 day vacation. Her hunger and cravings are controlled. Has a upcoming cruise trip to [place] .   Pharmacotherapy that aid with weight loss: She is currently taking Mounjaro 5 mg wkly.   Review of Systems:  Pertinent positives were addressed with patient today.  Reviewed by clinician on day of visit: allergies, medications, problem list, medical history, surgical history, family history, social history, and previous encounter notes.  Weight Summary and Biometrics   Weight Lost Since Last Visit: 1lb  Weight Gained Since Last Visit: 0    Vitals Temp: 98.3 F (36.8 C) BP: 138/75 Pulse Rate: 84 SpO2: 99 %   Anthropometric Measurements Height: 5\' 6"  (1.676 m) Weight: 194 lb (88 kg) BMI (Calculated): 31.33 Weight at Last Visit: 195lb Weight Lost Since Last Visit: 1lb Weight Gained Since Last Visit: 0 Starting Weight: 202lb Total Weight Loss (lbs): 8 lb (3.629 kg)   Body Composition  Body Fat %:  43.3 % Fat Mass (lbs): 84.2 lbs Muscle Mass (lbs): 104.6 lbs Total Body Water (lbs): 77.6 lbs Visceral Fat Rating : 12   Other Clinical Data Fasting: no Labs: no Today's Visit #: 30 Starting Date: 06/24/21 Comments: Cat2   Objective:   PHYSICAL EXAM: Blood pressure 138/75, pulse 84, temperature 98.3 F (36.8 C), height 5\' 6"  (1.676 m), weight 194 lb (88 kg), SpO2 99%. Body mass index is 31.31 kg/m.  General: she is overweight, cooperative and in no acute distress. PSYCH: Has normal mood, affect and thought process.   HEENT: EOMI, sclerae are anicteric. Lungs: Normal breathing effort, no conversational dyspnea. Extremities: Moves * 4 Neurologic: A and O * 3, good insight  DIAGNOSTIC DATA REVIEWED: BMET    Component Value Date/Time   NA 129 (L) 09/07/2023 1336   K 4.1 09/07/2023 1336   CL 92 (L) 09/07/2023 1336   CO2 25 09/07/2023 1336   GLUCOSE 94 09/07/2023 1336   BUN 24 09/07/2023 1336   CREATININE 0.56 (L)  09/07/2023 1336   CALCIUM  9.8 09/07/2023 1336   GFRNONAA 98 10/13/2019 1003   GFRAA 113 10/13/2019 1003   Lab Results  Component Value Date   HGBA1C 6.1 (H) 09/07/2023   HGBA1C 6.3 (A) 08/22/2014   Lab Results  Component Value Date   INSULIN  9.0 10/14/2021   INSULIN  11.3 06/24/2021   Lab Results  Component Value Date   TSH 2.120 06/24/2021   CBC    Component Value Date/Time   WBC 4.9 06/24/2021 1011   RBC 4.71 06/24/2021 1011   HGB 13.6 06/24/2021 1011   HCT 41.5 06/24/2021 1011   PLT 373 06/24/2021 1011   MCV 88 06/24/2021 1011   MCH 28.9 06/24/2021 1011   MCHC 32.8 06/24/2021 1011   RDW 13.1 06/24/2021 1011   Iron Studies No results found for: "IRON", "TIBC", "FERRITIN", "IRONPCTSAT" Lipid Panel     Component Value Date/Time   CHOL 183 09/07/2023 1336   TRIG 107 09/07/2023 1336   HDL 64 09/07/2023 1336   CHOLHDL 2.9 09/07/2023 1336   LDLCALC 100 (H) 09/07/2023 1336   Hepatic Function Panel     Component Value Date/Time   PROT 6.4 09/07/2023 1336   ALBUMIN 4.4 09/07/2023 1336   AST 32 09/07/2023 1336   ALT 30 09/07/2023 1336   ALKPHOS 91 09/07/2023 1336   BILITOT 0.3 09/07/2023 1336      Component Value Date/Time   TSH 2.120 06/24/2021 1011   Nutritional Lab Results  Component Value Date   VD25OH 68.5 03/12/2023   VD25OH 48.6 03/04/2022   VD25OH 58.7 10/14/2021    Attestations:   I, Special Puri, acting as a Stage manager for [employer], DO., have compiled all relevant documentation for today's office visit on behalf of Krista Sensor, DO, while in the presence of [employer], DO.  I have reviewed the above documentation for accuracy and completeness, and I agree with the above. Krista Cooper, D.O.  The 21st Century Cures Act was signed into law in 2016 which includes the topic of electronic health records.  This provides immediate access to information in MyChart.  This includes consultation notes, operative notes, office notes,  lab results and pathology reports.  If you have any questions about what you read please let us  know at your next visit so we can discuss your concerns and take corrective action if need be.  We are right here with you.

## 2023-10-01 ENCOUNTER — Ambulatory Visit: Payer: Self-pay | Admitting: Family Medicine

## 2023-10-01 LAB — COMPREHENSIVE METABOLIC PANEL WITH GFR
ALT: 25 IU/L (ref 0–32)
AST: 26 IU/L (ref 0–40)
Albumin: 4.5 g/dL (ref 3.9–4.9)
Alkaline Phosphatase: 94 IU/L (ref 44–121)
BUN/Creatinine Ratio: 33 — ABNORMAL HIGH (ref 12–28)
BUN: 25 mg/dL (ref 8–27)
Bilirubin Total: 0.3 mg/dL (ref 0.0–1.2)
CO2: 23 mmol/L (ref 20–29)
Calcium: 9.6 mg/dL (ref 8.7–10.3)
Chloride: 100 mmol/L (ref 96–106)
Creatinine, Ser: 0.75 mg/dL (ref 0.57–1.00)
Globulin, Total: 1.9 g/dL (ref 1.5–4.5)
Glucose: 87 mg/dL (ref 70–99)
Potassium: 4.1 mmol/L (ref 3.5–5.2)
Sodium: 138 mmol/L (ref 134–144)
Total Protein: 6.4 g/dL (ref 6.0–8.5)
eGFR: 87 mL/min/{1.73_m2} (ref >59)

## 2023-10-01 NOTE — Telephone Encounter (Signed)
 Left voicemail advising per Dr.B Voicemail advised to disregard if labs have already been completed as it looks like orders were placed and patient has already come. Patient will receive results via mychart and call if needed

## 2023-10-17 DIAGNOSIS — E119 Type 2 diabetes mellitus without complications: Secondary | ICD-10-CM | POA: Diagnosis not present

## 2023-10-25 ENCOUNTER — Ambulatory Visit: Admitting: Family Medicine

## 2023-10-25 ENCOUNTER — Encounter: Payer: Self-pay | Admitting: Family Medicine

## 2023-10-25 VITALS — BP 129/76 | HR 84 | Ht 66.0 in | Wt 197.2 lb

## 2023-10-25 DIAGNOSIS — E1159 Type 2 diabetes mellitus with other circulatory complications: Secondary | ICD-10-CM | POA: Diagnosis not present

## 2023-10-25 DIAGNOSIS — J301 Allergic rhinitis due to pollen: Secondary | ICD-10-CM

## 2023-10-25 DIAGNOSIS — E1165 Type 2 diabetes mellitus with hyperglycemia: Secondary | ICD-10-CM | POA: Diagnosis not present

## 2023-10-25 DIAGNOSIS — I152 Hypertension secondary to endocrine disorders: Secondary | ICD-10-CM | POA: Diagnosis not present

## 2023-10-25 DIAGNOSIS — E785 Hyperlipidemia, unspecified: Secondary | ICD-10-CM

## 2023-10-25 DIAGNOSIS — E1169 Type 2 diabetes mellitus with other specified complication: Secondary | ICD-10-CM

## 2023-10-25 MED ORDER — AMLODIPINE BESYLATE 10 MG PO TABS: 10.0000 mg | ORAL_TABLET | Freq: Every day | ORAL | 1 refills | Status: DC

## 2023-10-25 MED ORDER — ROSUVASTATIN CALCIUM 40 MG PO TABS: 40.0000 mg | ORAL_TABLET | Freq: Every day | ORAL | 1 refills | Status: DC

## 2023-10-25 NOTE — Assessment & Plan Note (Signed)
 Patient has had exacerbation of her nasal congestion with some clear sputum production since going on an [place] cruise a few weeks ago. She has used mucinex to clear this up which has worked well for her. She doesn't have any fever and her lungs sound excellent today so I do not think she has any consolidation or other lung pathology. -Use flonase  PRN for allergies -inform office if feeling unwell/short of breath/febrile

## 2023-10-25 NOTE — Assessment & Plan Note (Signed)
 Patient reports no difficulty taking her blood pressure medication and has been well controlled on amlodipine  10mg . She has not noticed any issues with the medication and has been checking her BP at home. -continue to check BP at home -continue amlodipine 

## 2023-10-25 NOTE — Assessment & Plan Note (Signed)
 Patient's most recent LDL was 100 which is down from before but is not yet at goal. She has been on rosuvastatin  20 since October and is taking coenzyme q. She has had not had any myalgias in the past. Explained importance of keeping LDL below 70. Patient in agreement. -We will go up to rosuvastatin  40mg  to see if this will get her LDL below 70 -consider ezetimibe if not improved

## 2023-10-25 NOTE — Progress Notes (Signed)
 Established Patient Office Visit  Subjective   Patient ID: Krista Cooper, female    DOB: 10-05-1955  Age: 68 y.o. MRN: [id number]  Chief Complaint  Patient presents with   Medical Management of Chronic Issues    Patient is present for htn f/u and also reports wanting lungs checked due to returning from her trip to [place]    Hypertension    Pt reports taking medications as prescribed with no sie effects to report and tolerating new medication well. She reports no symptoms with home readings in the last 2 weeks ranging from 116-141/67-81. She does not smoke or take anything that could affect her blood pressure    Krista Cooper is a 68 y/o female with hypertension and T2DM presenting to the office today for blood pressure follow up. She reports good compliance with her medications and has not noticed any negative side effects. She has been monitoring her blood pressures at home which have been ranging from 116-141/67-81. She denies any chest pain or shortness of breath.  She also wanted to have us  take a listen to her lungs as she has been having some allergies and cough since returning from an [place] cruise and land excursion; a few friends of hers from the cruise have told her they did end up getting pneumonia. She states her allergies have been acting up as the cedar trees were blooming up there when she arrived. She had used mucinex which has been heplful for her congestion. She is still mildly congested but is doing better. She has had an occasional cough with scant clear sputum production but denies any fevers, chills, unintentional weight loss, or blood in her sputum.   Past Medical History:  Diagnosis Date   Allergy    Anxiety    Diabetes mellitus without complication (HCC)    Hyperlipidemia    Hypertension    Obesity    Prediabetes    Seasonal allergies    Past Surgical History:  Procedure Laterality Date   CESAREAN SECTION     COLONOSCOPY     COLONOSCOPY WITH PROPOFOL   N/A 12/22/2022   Procedure: COLONOSCOPY WITH PROPOFOL ;  Surgeon: Marnee Sink, MD;  Location: [facility] ENDOSCOPY;  Service: Endoscopy;  Laterality: N/A;   FRACTURE SURGERY Right 05/19/1995   PLATES AND PINS   MOUTH SURGERY  05/18/1974   WISDOM TEETH REMOVED   POLYPECTOMY  12/22/2022   Procedure: POLYPECTOMY;  Surgeon: Marnee Sink, MD;  Location: [facility] ENDOSCOPY;  Service: Endoscopy;;      Review of Systems  Constitutional:  Negative for chills, fever and weight loss.  HENT:  Positive for congestion.   Eyes: Negative.   Respiratory:  Positive for cough and sputum production. Negative for hemoptysis, shortness of breath and wheezing.   Cardiovascular:  Negative for chest pain.  Gastrointestinal: Negative.   Genitourinary: Negative.   Musculoskeletal: Negative.   Skin: Negative.   Neurological: Negative.   Psychiatric/Behavioral: Negative.        Objective:     BP 129/76   Pulse 84   Ht 5\' 6"  (1.676 m)   Wt 197 lb 3.2 oz (89.4 kg)   SpO2 98%   BMI 31.83 kg/m  BP Readings from Last 3 Encounters:  10/25/23 129/76  09/30/23 138/75  09/07/23 128/82   Wt Readings from Last 3 Encounters:  10/25/23 197 lb 3.2 oz (89.4 kg)  09/30/23 194 lb (88 kg)  09/07/23 198 lb (89.8 kg)      Physical Exam Constitutional:  General: She is not in acute distress.    Appearance: Normal appearance.  HENT:     Head: Normocephalic and atraumatic.     Right Ear: External ear normal.     Left Ear: External ear normal.     Nose: Nose normal.     Mouth/Throat:     Mouth: Mucous membranes are moist.     Pharynx: Oropharynx is clear.  Eyes:     General: No scleral icterus.    Extraocular Movements: Extraocular movements intact.     Pupils: Pupils are equal, round, and reactive to light.  Cardiovascular:     Rate and Rhythm: Normal rate and regular rhythm.     Pulses: Normal pulses.     Heart sounds: Normal heart sounds. No murmur heard.    No friction rub. No gallop.  Pulmonary:      Effort: Pulmonary effort is normal. No respiratory distress.     Breath sounds: Normal breath sounds. No stridor. No wheezing, rhonchi or rales.  Abdominal:     General: Abdomen is flat. There is no distension.     Palpations: Abdomen is soft.  Musculoskeletal:        General: No swelling. Normal range of motion.     Cervical back: Normal range of motion.  Skin:    General: Skin is warm and dry.     Coloration: Skin is not jaundiced or pale.     Findings: No bruising or erythema.  Neurological:     General: No focal deficit present.     Mental Status: She is alert and oriented to person, place, and time. Mental status is at baseline.     Cranial Nerves: No cranial nerve deficit.     Motor: No weakness.  Psychiatric:        Mood and Affect: Mood normal.        Behavior: Behavior normal.      No results found for any visits on 10/25/23.  Last CBC Lab Results  Component Value Date   WBC 4.9 06/24/2021   HGB 13.6 06/24/2021   HCT 41.5 06/24/2021   MCV 88 06/24/2021   MCH 28.9 06/24/2021   RDW 13.1 06/24/2021   PLT 373 06/24/2021   Last metabolic panel Lab Results  Component Value Date   GLUCOSE 87 09/30/2023   NA 138 09/30/2023   K 4.1 09/30/2023   CL 100 09/30/2023   CO2 23 09/30/2023   BUN 25 09/30/2023   CREATININE 0.75 09/30/2023   EGFR 87 09/30/2023   CALCIUM  9.6 09/30/2023   PROT 6.4 09/30/2023   ALBUMIN 4.5 09/30/2023   LABGLOB 1.9 09/30/2023   AGRATIO 2.0 09/04/2022   BILITOT 0.3 09/30/2023   ALKPHOS 94 09/30/2023   AST 26 09/30/2023   ALT 25 09/30/2023   Last lipids Lab Results  Component Value Date   CHOL 183 09/07/2023   HDL 64 09/07/2023   LDLCALC 100 (H) 09/07/2023   TRIG 107 09/07/2023   CHOLHDL 2.9 09/07/2023   Last hemoglobin A1c Lab Results  Component Value Date   HGBA1C 6.1 (H) 09/07/2023      The 10-year ASCVD risk score (Arnett DK, et al., 2019) is: 15.9%    Assessment & Plan:   Problem List Items Addressed This Visit        Cardiovascular and Mediastinum   Hypertension associated with diabetes (HCC) - Primary   Patient reports no difficulty taking her blood pressure medication and has been well controlled on amlodipine  10mg .  She has not noticed any issues with the medication and has been checking her BP at home. -continue to check BP at home -continue amlodipine       Relevant Medications   rosuvastatin  (CRESTOR ) 40 MG tablet   amLODipine  (NORVASC ) 10 MG tablet     Respiratory   Allergic rhinitis   Patient has had exacerbation of her nasal congestion with some clear sputum production since going on an [place] cruise a few weeks ago. She has used mucinex to clear this up which has worked well for her. She doesn't have any fever and her lungs sound excellent today so I do not think she has any consolidation or other lung pathology. -Use flonase  PRN for allergies -inform office if feeling unwell/short of breath/febrile         Endocrine   Hyperlipidemia associated with type 2 diabetes mellitus (HCC)   Patient's most recent LDL was 100 which is down from before but is not yet at goal. She has been on rosuvastatin  20 since October and is taking coenzyme q. She has had not had any myalgias in the past. Explained importance of keeping LDL below 70. Patient in agreement. -We will go up to rosuvastatin  40mg  to see if this will get her LDL below 70 -consider ezetimibe if not improved      Relevant Medications   rosuvastatin  (CRESTOR ) 40 MG tablet   amLODipine  (NORVASC ) 10 MG tablet   T2DM (type 2 diabetes mellitus) (HCC)   Diabetes management is doing well on Mounjaro 5 mg weekly.  Weight has decreased from 198 lbs to 195 lbs. Last A1c was 6.1 - Continue Mounjaro 5 mg weekly -continue to exercise and work with healthy weight and wellness clinic         Relevant Medications   rosuvastatin  (CRESTOR ) 40 MG tablet    Return in about 4 months (around 02/24/2024) for CPE, as scheduled.    Monda Angry, Medical Student   Patient seen along with MS3 student, Luther Saltness. I personally evaluated this patient along with the student, and verified all aspects of the history, physical exam, and medical decision making as documented by the student. I agree with the student's documentation and have made all necessary edits.  Bevin Mayall, Stan Eans, MD, MPH [facility]

## 2023-10-25 NOTE — Assessment & Plan Note (Signed)
 Diabetes management is doing well on Mounjaro 5 mg weekly.  Weight has decreased from 198 lbs to 195 lbs. Last A1c was 6.1 - Continue Mounjaro 5 mg weekly -continue to exercise and work with healthy weight and wellness clinic

## 2023-10-26 ENCOUNTER — Ambulatory Visit (INDEPENDENT_AMBULATORY_CARE_PROVIDER_SITE_OTHER): Admitting: Family Medicine

## 2023-10-26 ENCOUNTER — Encounter (INDEPENDENT_AMBULATORY_CARE_PROVIDER_SITE_OTHER): Payer: Self-pay | Admitting: Family Medicine

## 2023-10-26 VITALS — BP 120/74 | HR 88 | Temp 98.4°F | Ht 66.0 in | Wt 194.0 lb

## 2023-10-26 DIAGNOSIS — E1165 Type 2 diabetes mellitus with hyperglycemia: Secondary | ICD-10-CM

## 2023-10-26 DIAGNOSIS — E785 Hyperlipidemia, unspecified: Secondary | ICD-10-CM | POA: Diagnosis not present

## 2023-10-26 DIAGNOSIS — E1169 Type 2 diabetes mellitus with other specified complication: Secondary | ICD-10-CM | POA: Diagnosis not present

## 2023-10-26 DIAGNOSIS — Z7985 Long-term (current) use of injectable non-insulin antidiabetic drugs: Secondary | ICD-10-CM

## 2023-10-26 DIAGNOSIS — E669 Obesity, unspecified: Secondary | ICD-10-CM

## 2023-10-26 DIAGNOSIS — Z6831 Body mass index (BMI) 31.0-31.9, adult: Secondary | ICD-10-CM

## 2023-10-26 MED ORDER — TIRZEPATIDE 5 MG/0.5ML ~~LOC~~ SOAJ: 5.0000 mg | SUBCUTANEOUS | 1 refills | Status: DC

## 2023-10-26 NOTE — Progress Notes (Signed)
 Krista Cooper, D.O.  ABFM, ABOM Specializing in Clinical Bariatric Medicine  Office located at: [address] [place], [place]  [address]   Assessment and Plan:   Medications Discontinued During This Encounter  Medication Reason   tirzepatide  (MOUNJARO ) 5 MG/0.5ML Pen Reorder     Meds ordered this encounter  Medications   tirzepatide  (MOUNJARO ) 5 MG/0.5ML Pen    Sig: Inject 5 mg into the skin once a week.    Dispense:  2 mL    Refill:  1    FOR THE DISEASE OF OBESITY:  BMI 31.0-31.9,adult - current BMI 31.33 Generalized obesity- START BMI 32.6 Assessment & Plan: Since last office visit on 09/30/2023 patient's  Muscle mass has increased by 1.2 lb. Fat mass has decreased by 0.8 lb. Total body water has increased by 0.8 lb.  Counseling done on how various foods will affect these numbers and how to maximize success  Total lbs lost to date: 8 lbs Total weight loss percentage to date: 3.96%    Recommended Dietary Goals Krista Cooper is currently in the action stage of change. As such, her goal is to continue weight management plan.  She has agreed to: continue current plan   Behavioral Intervention We discussed the following today: work on tracking and journaling calories using tracking application  Additional resources provided today: None  Evidence-based interventions for health behavior change were utilized today including the discussion of self monitoring techniques, problem-solving barriers and SMART goal setting techniques.   Regarding patient's less desirable eating habits and patterns, we employed the technique of small changes.   Pt will specifically work on: n/a   Recommended Physical Activity Goals Krista Cooper has been advised to work up to 300-450 minutes of moderate intensity aerobic activity a week and strengthening exercises 2-3 times per week for cardiovascular health, weight loss maintenance and preservation of muscle mass.   She has agreed to walk 5  miles daily.    Pharmacotherapy We both agreed to continue same regimen.    ASSOCIATED CONDITIONS ADDRESSED TODAY:  Type 2 diabetes mellitus with hyperglycemia, without long-term current use of insulin  [facility]) Assessment & Plan: Lab Results  Component Value Date   HGBA1C 6.1 (H) 09/07/2023   HGBA1C 6.4 (H) 03/12/2023   HGBA1C 5.9 (A) 09/01/2022   INSULIN  9.0 10/14/2021   INSULIN  11.3 06/24/2021    HgbA1c is controlled for age and comorbid conditions. Denies symptoms of hypoglycemia or hyperglycemia. On Mounjaro  5 mg wkly with good adherence and no side effects. Has good control over her hunger and cravings. Blood pressure controled.   Continue GLP-1 therapy and balanced diet focusing on protein, fruits, and vegetables while limiting simple carbohydrates. Losing 10% or more of body weight may improve conditions. Encouraged adequate hydration.    Hyperlipidemia associated with type 2 diabetes mellitus [facility]) Assessment & Plan: Lab Results  Component Value Date   CHOL 183 09/07/2023   HDL 64 09/07/2023   LDLCALC 100 (H) 09/07/2023   TRIG 107 09/07/2023   CHOLHDL 2.9 09/07/2023   The 10-year ASCVD risk score (Arnett DK, et al., 2019) is: 13.9%   Values used to calculate the score:     Age: 68 years     Sex: Female     Is Non-Hispanic African American: No     Diabetic: Yes     Tobacco smoker: No     Systolic Blood Pressure: 120 mmHg     Is BP treated: Yes     HDL Cholesterol:  64 mg/dL     Total Cholesterol: 183 mg/dL  Her PCP increased her Rosuvastatin  to 40 mg daily since her LDL was not at goal. Continue statin therapy and low cholesterol meal plan.    Follow up:   Return 11/24/2023 at 10:00 AM with Jenean Minus, MD. She was informed of the importance of frequent follow up visits to maximize her success with intensive lifestyle modifications for her multiple health conditions.  Subjective:   Chief complaint: Obesity Krista Cooper is here to discuss her progress with  her obesity treatment plan. She is  keeping a food journal and adhering to recommended goals of 1400-1500 calories and 95 or more grams protein daily with the Category 2 meal plan as guide and states she is following her eating plan approximately 85% of the time. She states she is walking and or doing aerobics 30-45 minutes 5 days per week   Interval History:  Krista Cooper is here for a follow up office visit.   Since last OV on 09/30/2023, Ms.Pacer's wt has not changed.   Returned from a pleasant cruise trip to [place] ; was mindful of limiting her alcohol intake but did indulge on some off-plan foods.   Journaled a bit and is ready to be more consistent with it.   Pharmacotherapy that aid with weight loss: She is currently taking Mounjaro  5 mg wkly.   Review of Systems:  Pertinent positives were addressed with patient today.  Reviewed by clinician on day of visit: allergies, medications, problem list, medical history, surgical history, family history, social history, and previous encounter notes.  Weight Summary and Biometrics   Weight Lost Since Last Visit: 0  Weight Gained Since Last Visit: 0  Vitals Temp: 98.4 F (36.9 C) BP: 120/74 Pulse Rate: 88 SpO2: 98 %   Anthropometric Measurements Height: 5' 6 (1.676 m) Weight: 194 lb (88 kg) BMI (Calculated): 31.33 Weight at Last Visit: 194lb Weight Lost Since Last Visit: 0 Weight Gained Since Last Visit: 0 Starting Weight: 202lb Total Weight Loss (lbs): 8 lb (3.629 kg)   Body Composition  Body Fat %: 42.8 % Fat Mass (lbs): 83.4 lbs Muscle Mass (lbs): 105.8 lbs Total Body Water (lbs): 78.4 lbs Visceral Fat Rating : 12   Other Clinical Data Fasting: no Labs: no Today's Visit #: 31 Starting Date: 06/24/21    Objective:   PHYSICAL EXAM: Blood pressure 120/74, pulse 88, temperature 98.4 F (36.9 C), height 5' 6 (1.676 m), weight 194 lb (88 kg), SpO2 98%. Body mass index is 31.31 kg/m.  General: she is  overweight, cooperative and in no acute distress. PSYCH: Has normal mood, affect and thought process.   HEENT: EOMI, sclerae are anicteric. Lungs: Normal breathing effort, no conversational dyspnea. Extremities: Moves * 4 Neurologic: A and O * 3, good insight  DIAGNOSTIC DATA REVIEWED: BMET    Component Value Date/Time   NA 138 09/30/2023 1433   K 4.1 09/30/2023 1433   CL 100 09/30/2023 1433   CO2 23 09/30/2023 1433   GLUCOSE 87 09/30/2023 1433   BUN 25 09/30/2023 1433   CREATININE 0.75 09/30/2023 1433   CALCIUM  9.6 09/30/2023 1433   GFRNONAA 98 10/13/2019 1003   GFRAA 113 10/13/2019 1003   Lab Results  Component Value Date   HGBA1C 6.1 (H) 09/07/2023   HGBA1C 6.3 (A) 08/22/2014   Lab Results  Component Value Date   INSULIN  9.0 10/14/2021   INSULIN  11.3 06/24/2021   Lab Results  Component Value Date  TSH 2.120 06/24/2021   CBC    Component Value Date/Time   WBC 4.9 06/24/2021 1011   RBC 4.71 06/24/2021 1011   HGB 13.6 06/24/2021 1011   HCT 41.5 06/24/2021 1011   PLT 373 06/24/2021 1011   MCV 88 06/24/2021 1011   MCH 28.9 06/24/2021 1011   MCHC 32.8 06/24/2021 1011   RDW 13.1 06/24/2021 1011   Iron Studies No results found for: IRON, TIBC, FERRITIN, IRONPCTSAT Lipid Panel     Component Value Date/Time   CHOL 183 09/07/2023 1336   TRIG 107 09/07/2023 1336   HDL 64 09/07/2023 1336   CHOLHDL 2.9 09/07/2023 1336   LDLCALC 100 (H) 09/07/2023 1336   Hepatic Function Panel     Component Value Date/Time   PROT 6.4 09/30/2023 1433   ALBUMIN 4.5 09/30/2023 1433   AST 26 09/30/2023 1433   ALT 25 09/30/2023 1433   ALKPHOS 94 09/30/2023 1433   BILITOT 0.3 09/30/2023 1433      Component Value Date/Time   TSH 2.120 06/24/2021 1011   Nutritional Lab Results  Component Value Date   VD25OH 68.5 03/12/2023   VD25OH 48.6 03/04/2022   VD25OH 58.7 10/14/2021    Attestations:   I, Special Puri, acting as a Stage manager for [employer], DO.,  have compiled all relevant documentation for today's office visit on behalf of Marceil Sensor, DO, while in the presence of [employer], DO.  I have reviewed the above documentation for accuracy and completeness, and I agree with the above. Krista Cooper, D.O.  The 21st Century Cures Act was signed into law in 2016 which includes the topic of electronic health records.  This provides immediate access to information in MyChart.  This includes consultation notes, operative notes, office notes, lab results and pathology reports.  If you have any questions about what you read please let us  know at your next visit so we can discuss your concerns and take corrective action if need be.  We are right here with you.

## 2023-10-27 DIAGNOSIS — H903 Sensorineural hearing loss, bilateral: Secondary | ICD-10-CM | POA: Diagnosis not present

## 2023-10-28 ENCOUNTER — Other Ambulatory Visit: Payer: Self-pay | Admitting: Family Medicine

## 2023-10-28 DIAGNOSIS — Z1231 Encounter for screening mammogram for malignant neoplasm of breast: Secondary | ICD-10-CM

## 2023-10-28 DIAGNOSIS — F419 Anxiety disorder, unspecified: Secondary | ICD-10-CM | POA: Diagnosis not present

## 2023-11-01 ENCOUNTER — Other Ambulatory Visit: Payer: Self-pay | Admitting: Family Medicine

## 2023-11-09 ENCOUNTER — Ambulatory Visit (INDEPENDENT_AMBULATORY_CARE_PROVIDER_SITE_OTHER): Admitting: Family Medicine

## 2023-11-15 ENCOUNTER — Ambulatory Visit (INDEPENDENT_AMBULATORY_CARE_PROVIDER_SITE_OTHER): Admitting: Family Medicine

## 2023-11-16 DIAGNOSIS — E119 Type 2 diabetes mellitus without complications: Secondary | ICD-10-CM | POA: Diagnosis not present

## 2023-11-16 DIAGNOSIS — H903 Sensorineural hearing loss, bilateral: Secondary | ICD-10-CM | POA: Diagnosis not present

## 2023-11-24 ENCOUNTER — Encounter (INDEPENDENT_AMBULATORY_CARE_PROVIDER_SITE_OTHER): Payer: Self-pay | Admitting: Family Medicine

## 2023-11-24 ENCOUNTER — Ambulatory Visit (INDEPENDENT_AMBULATORY_CARE_PROVIDER_SITE_OTHER): Admitting: Family Medicine

## 2023-11-24 VITALS — BP 136/71 | HR 2 | Temp 98.1°F | Ht 66.0 in | Wt 194.0 lb

## 2023-11-24 DIAGNOSIS — E66811 Obesity, class 1: Secondary | ICD-10-CM | POA: Diagnosis not present

## 2023-11-24 DIAGNOSIS — E1165 Type 2 diabetes mellitus with hyperglycemia: Secondary | ICD-10-CM | POA: Diagnosis not present

## 2023-11-24 DIAGNOSIS — E785 Hyperlipidemia, unspecified: Secondary | ICD-10-CM | POA: Diagnosis not present

## 2023-11-24 DIAGNOSIS — E1169 Type 2 diabetes mellitus with other specified complication: Secondary | ICD-10-CM

## 2023-11-24 DIAGNOSIS — Z6831 Body mass index (BMI) 31.0-31.9, adult: Secondary | ICD-10-CM

## 2023-11-24 DIAGNOSIS — Z7985 Long-term (current) use of injectable non-insulin antidiabetic drugs: Secondary | ICD-10-CM

## 2023-11-24 NOTE — Progress Notes (Unsigned)
 SUBJECTIVE:  Chief Complaint: Obesity  Interim History: Patient voices since last time she was seen by me she went on a cruise to [place] - 38-44 degrees in May.  Then she went to the beach in [place] at the end of June.  Then she went to the [facility] one day for yoga and once for circuit training; enjoyed the classes. She is going to [facility] [place] July 22 to July 30th.  Wants to stick with her current plan for the next 5 weeks.   Krista Cooper is here to discuss her progress with her obesity treatment plan. She is on the Category 2 Plan and 1400-1500 calories and 95 grams of protein and states she is following her eating plan approximately 90 % of the time. She states she is exercising 200 minutes per week.   OBJECTIVE: Visit Diagnoses: Problem List Items Addressed This Visit       Endocrine   Hyperlipidemia associated with type 2 diabetes mellitus (HCC)   T2DM (type 2 diabetes mellitus) (HCC) - Primary     Other   Class 1 obesity with serious comorbidity and body mass index (BMI) of 32.0 to 32.9 in adult   Other Visit Diagnoses       BMI 31.0-31.9,adult           Vitals Temp: 98.1 F (36.7 C) BP: 136/71 Pulse Rate: (!) 2 SpO2: 98 %   Anthropometric Measurements Height: 5' 6 (1.676 m) Weight: 194 lb (88 kg) BMI (Calculated): 31.33 Weight at Last Visit: 194 lb Weight Lost Since Last Visit: 0 Weight Gained Since Last Visit: 0 Starting Weight: 202 lb Total Weight Loss (lbs): 8 lb (3.629 kg)   Body Composition  Body Fat %: 42.4 % Fat Mass (lbs): 82.4 lbs Muscle Mass (lbs): 106.4 lbs Total Body Water (lbs): 76.2 lbs Visceral Fat Rating : 12   Other Clinical Data Fasting: no Labs: no Today's Visit #: 32 Starting Date: 06/24/21 Comments: Cat 2, 1400-1500     ASSESSMENT AND PLAN: Assessment & Plan Type 2 diabetes mellitus with hyperglycemia, without long-term current use of insulin  (HCC) On Ozempic  0.75mg  weekly. She is not experiencing any GI side effects of  the medication.  She mentions she is having good intake control.  Will continue current medication dosage. Hyperlipidemia associated with type 2 diabetes mellitus (HCC) On crestor  20mg  daily.  Patient will have labs wit PCP at next appointment. BMI 31.0-31.9,adult  Class 1 obesity with serious comorbidity and body mass index (BMI) of 32.0 to 32.9 in adult, unspecified obesity type    Diet: Nyoka is currently in the action stage of change. As such, her goal is to continue with weight loss efforts and has agreed to the Category 2 Plan and keeping a food journal and adhering to recommended goals of 1400-1500 calories and 95 or more grams of protein daily.  Patient to start food log or journaling meal plan.  The initial goal will be to habitually log or journal for at least 4 days a week.  The expectation it that patient may not initially meet calorie or protein goals as the nturitional understanding of food intake is begun.  We discussed the 10:1 ratio when reading a food label.  Patient agrees to keep a food log either electronically or on paper and bring to the next appointment to be able to dissect and discuss it with provider.    Exercise:  Older adults should determine their level of effort for physical activity relative to their  level of fitness.  Behavior Modification:  We discussed the following Behavioral Modification Strategies today: increasing lean protein intake, decreasing simple carbohydrates, increasing vegetables, meal planning and cooking strategies, planning for success, and keep a strict food journal. .  Return in about 4 weeks (around 12/22/2023).   She was informed of the importance of frequent follow up visits to maximize her success with intensive lifestyle modifications for her multiple health conditions.  Attestation Statements:   Reviewed by clinician on day of visit: allergies, medications, problem list, medical history, surgical history, family history, social  history, and previous encounter notes.     Adelita Cho, MD

## 2023-11-25 NOTE — Assessment & Plan Note (Signed)
 On crestor  20mg  daily.  Patient will have labs wit PCP at next appointment.

## 2023-11-25 NOTE — Assessment & Plan Note (Signed)
 On Ozempic  0.75mg  weekly. She is not experiencing any GI side effects of the medication.  She mentions she is having good intake control.  Will continue current medication dosage.

## 2023-11-27 ENCOUNTER — Encounter (INDEPENDENT_AMBULATORY_CARE_PROVIDER_SITE_OTHER): Payer: Self-pay | Admitting: Family Medicine

## 2023-12-17 DIAGNOSIS — E119 Type 2 diabetes mellitus without complications: Secondary | ICD-10-CM | POA: Diagnosis not present

## 2023-12-20 ENCOUNTER — Ambulatory Visit (INDEPENDENT_AMBULATORY_CARE_PROVIDER_SITE_OTHER): Admitting: Family Medicine

## 2023-12-20 ENCOUNTER — Encounter (INDEPENDENT_AMBULATORY_CARE_PROVIDER_SITE_OTHER): Payer: Self-pay | Admitting: Family Medicine

## 2023-12-20 VITALS — BP 113/71 | HR 70 | Temp 97.6°F | Ht 66.0 in | Wt 193.0 lb

## 2023-12-20 DIAGNOSIS — I152 Hypertension secondary to endocrine disorders: Secondary | ICD-10-CM | POA: Diagnosis not present

## 2023-12-20 DIAGNOSIS — E1165 Type 2 diabetes mellitus with hyperglycemia: Secondary | ICD-10-CM

## 2023-12-20 DIAGNOSIS — E1159 Type 2 diabetes mellitus with other circulatory complications: Secondary | ICD-10-CM

## 2023-12-20 DIAGNOSIS — Z6831 Body mass index (BMI) 31.0-31.9, adult: Secondary | ICD-10-CM

## 2023-12-20 DIAGNOSIS — E669 Obesity, unspecified: Secondary | ICD-10-CM

## 2023-12-20 DIAGNOSIS — Z7985 Long-term (current) use of injectable non-insulin antidiabetic drugs: Secondary | ICD-10-CM

## 2023-12-20 MED ORDER — TIRZEPATIDE 7.5 MG/0.5ML ~~LOC~~ SOAJ: 7.5000 mg | SUBCUTANEOUS | 0 refills | Status: DC

## 2023-12-20 NOTE — Progress Notes (Signed)
 Krista DOROTHA Cooper, D.O.  ABFM, ABOM Specializing in Clinical Bariatric Medicine  Office located at: [address] [place], [place]  [address]   Assessment and Plan:   Medications Discontinued During This Encounter  Medication Reason   tirzepatide  (MOUNJARO ) 5 MG/0.5ML Pen Dose change     Meds ordered this encounter  Medications   tirzepatide  (MOUNJARO ) 7.5 MG/0.5ML Pen    Sig: Inject 7.5 mg into the skin once a week.    Dispense:  2 mL    Refill:  0     FOR THE DISEASE OF OBESITY:  BMI 31.0-31.9,adult - current BMI 31.15 Generalized obesity- START BMI 32.6 Assessment & Plan: Since last office visit on 11/24/2023 patient's muscle mass has decreased by 0.4 lbs. Fat mass has decreased by 0.2 lbs. Total body water has decreased by 1.8 lbs.  Body fat % is unchanged. Counseling done on how various foods will affect these numbers and how to maximize success  Total lbs lost to date: - 9 lbs Total weight loss percentage to date: -4.46 %   Recommended Dietary Goals [name] is currently in the action stage of change. As such, her goal is to continue weight management plan.  She has agreed to: continue current plan   Behavioral Intervention We discussed the following today: continue to work on tracking and journaling calories using tracking application, continue to work on implementation of reduced calorie nutritional plan, increasing water intake, focusing on food with a 10:1 ratio of calories: grams of protein, and discussed low calorie options for satisfying sweet tooth cravings;  ie dove chocolate, endangered species, CovoLove  Additional resources provided today: Handout on NEAT  Evidence-based interventions for health behavior change were utilized today including the discussion of self monitoring techniques, problem-solving barriers and SMART goal setting techniques.   Regarding patient's less desirable eating habits and patterns, we employed the technique of small  changes.   Goal: drinking 80+ ounces of water daily.   Recommended Physical Activity Goals [name] has been advised to work up to 300-450 minutes of moderate intensity aerobic activity a week and strengthening exercises 2-3 times per week for cardiovascular health, weight loss maintenance and preservation of muscle mass.   She will continue her cardio and stretching regimen and was encouraged to INCREASE NEAT.   Pharmacotherapy See T2DM note.   ASSOCIATED CONDITIONS ADDRESSED TODAY:   Type 2 diabetes mellitus with hyperglycemia, without long-term current use of insulin  [facility]) Assessment & Plan: Lab Results  Component Value Date   HGBA1C 6.1 (H) 09/07/2023   HGBA1C 6.4 (H) 03/12/2023   HGBA1C 5.9 (A) 09/01/2022   INSULIN  9.0 10/14/2021   INSULIN  11.3 06/24/2021    HgbA1c is controlled for age and comorbid conditions. Denies symptoms of hypoglycemia or hyperglycemia. On Mounjaro  5 mg weekly with good adherence and tolerance. Taking psyllium fiber husks 4 times daily and is having daily bowel movements. She does endorse having sweet cravings time to time.   - Discussed low calorie options for satisfying sweet tooth cravings - Reviewed normal fasting sugars for a diabetic.  - Shared decision making: INCREASE Mounjaro  to 7.5 mg weekly.  Reviewed risks/benefits. Encouraged adequate hydration.  - Continue to journal intake with an emphasis on decreasing simple carbs/ sugars; increasing fiber and proteins. - Advance exercise as able.     Hypertension associated with diabetes (HCC) Assessment & Plan: Last 3 blood pressure readings in our office are as follows: BP Readings from Last 3 Encounters:  12/20/23 113/71  11/24/23 136/71  10/26/23 120/74   The 10-year ASCVD risk score (Arnett DK, et al., 2019) is: 13.9%  Lab Results  Component Value Date   CREATININE 0.75 09/30/2023   Blood pressure is well-controlled on Amlodipine  10 mg daily. Pt asx. No acute concerns. Of note, she  was intolerant to Lisinopril in the past due to cough.   - Continue regimen - Continue with low sodium diet - Advance exercise as tolerated.  - Pt encouraged to discuss with PCP at next OV about potentially starting an ARB to protect her kidneys.     Follow up:   Return 01/20/2024 10:20 AM with [place] Adelita PENNER, MD.  She was informed of the importance of frequent follow up visits to maximize her success with intensive lifestyle modifications for her multiple health conditions.   Subjective:   Chief complaint: Obesity Krista Cooper is here to discuss her progress with her obesity treatment plan. She is on the Category 2 Plan and keeping a food journal and adhering to recommended goals of 1400-1500 calories and 95+ protein and states she is following her eating plan approximately 70-80% of the time. She states she is doing cardio and stretching 45 minutes 2-3 days per week.    Interval History:  Krista Cooper is here for a follow up office visit. Since last OV on 11/24/2023 , she is down 1 lb. She went on a  pleasant trip to [facility] from July 22 to July 30th. While there, she did have some alcoholic beverages and did not get in her lean proteins on some days.Prior to the trip and after returning, she has been following her meal plan and is journaling her intake.    Pharmacotherapy that aid with weight loss: She is currently taking Mounjaro  5 mg weekly     Review of Systems:  Pertinent positives were addressed with patient today.  Reviewed by clinician on day of visit: allergies, medications, problem list, medical history, surgical history, family history, social history, and previous encounter notes.  Weight Summary and Biometrics   Weight Lost Since Last Visit: 1lb  Weight Gained Since Last Visit: 0   Vitals Temp: 97.6 F (36.4 C) BP: 113/71 Pulse Rate: 70 SpO2: 99 %   Anthropometric Measurements Height: 5' 6 (1.676 m) Weight: 193 lb (87.5 kg) BMI (Calculated):  31.17 Weight at Last Visit: 194lb Weight Lost Since Last Visit: 1lb Weight Gained Since Last Visit: 0 Starting Weight: 202lb Total Weight Loss (lbs): 9 lb (4.082 kg)   Body Composition  Body Fat %: 42.4 % Fat Mass (lbs): 82.2 lbs Muscle Mass (lbs): 106 lbs Total Body Water (lbs): 74.4 lbs Visceral Fat Rating : 12   Other Clinical Data Fasting: no Labs: no Today's Visit #: 33 Starting Date: 06/24/21    Objective:   PHYSICAL EXAM: Blood pressure 113/71, pulse 70, temperature 97.6 F (36.4 C), height 5' 6 (1.676 m), weight 193 lb (87.5 kg), SpO2 99%. Body mass index is 31.15 kg/m.  General: she is overweight, cooperative and in no acute distress. PSYCH: Has normal mood, affect and thought process.   HEENT: EOMI, sclerae are anicteric. Lungs: Normal breathing effort, no conversational dyspnea. Extremities: Moves * 4 Neurologic: A and O * 3, good insight  DIAGNOSTIC DATA REVIEWED: BMET    Component Value Date/Time   NA 138 09/30/2023 1433   K 4.1 09/30/2023 1433   CL 100 09/30/2023 1433   CO2 23 09/30/2023 1433   GLUCOSE 87 09/30/2023 1433   BUN 25  09/30/2023 1433   CREATININE 0.75 09/30/2023 1433   CALCIUM  9.6 09/30/2023 1433   GFRNONAA 98 10/13/2019 1003   GFRAA 113 10/13/2019 1003   Lab Results  Component Value Date   HGBA1C 6.1 (H) 09/07/2023   HGBA1C 6.3 (A) 08/22/2014   Lab Results  Component Value Date   INSULIN  9.0 10/14/2021   INSULIN  11.3 06/24/2021   Lab Results  Component Value Date   TSH 2.120 06/24/2021   CBC    Component Value Date/Time   WBC 4.9 06/24/2021 1011   RBC 4.71 06/24/2021 1011   HGB 13.6 06/24/2021 1011   HCT 41.5 06/24/2021 1011   PLT 373 06/24/2021 1011   MCV 88 06/24/2021 1011   MCH 28.9 06/24/2021 1011   MCHC 32.8 06/24/2021 1011   RDW 13.1 06/24/2021 1011   Iron Studies No results found for: IRON, TIBC, FERRITIN, IRONPCTSAT Lipid Panel     Component Value Date/Time   CHOL 183 09/07/2023 1336    TRIG 107 09/07/2023 1336   HDL 64 09/07/2023 1336   CHOLHDL 2.9 09/07/2023 1336   LDLCALC 100 (H) 09/07/2023 1336   Hepatic Function Panel     Component Value Date/Time   PROT 6.4 09/30/2023 1433   ALBUMIN 4.5 09/30/2023 1433   AST 26 09/30/2023 1433   ALT 25 09/30/2023 1433   ALKPHOS 94 09/30/2023 1433   BILITOT 0.3 09/30/2023 1433      Component Value Date/Time   TSH 2.120 06/24/2021 1011   Nutritional Lab Results  Component Value Date   VD25OH 68.5 03/12/2023   VD25OH 48.6 03/04/2022   VD25OH 58.7 10/14/2021    Attestations:   I, Special Puri, acting as a Stage manager for [employer], DO., have compiled all relevant documentation for today's office visit on behalf of Krista Jenkins, DO, while in the presence of [employer], DO.  I have spent 46 minutes in the care of the patient today including 36 minutes face-to-face assessing and reviewing listed medical problems above as outlined in office visit note and providing nutritional and behavioral counseling as outlined in obesity care plan.   I have reviewed the above documentation for accuracy and completeness, and I agree with the above. Krista JINNY Cooper, D.O.  The 21st Century Cures Act was signed into law in 2016 which includes the topic of electronic health records.  This provides immediate access to information in MyChart.  This includes consultation notes, operative notes, office notes, lab results and pathology reports.  If you have any questions about what you read please let us  know at your next visit so we can discuss your concerns and take corrective action if need be.  We are right here with you.

## 2023-12-30 ENCOUNTER — Ambulatory Visit
Admission: RE | Admit: 2023-12-30 | Discharge: 2023-12-30 | Disposition: A | Discharge status: Home / Self Care | Source: Ambulatory Visit | Attending: Family Medicine | Admitting: Family Medicine

## 2023-12-30 DIAGNOSIS — Z1231 Encounter for screening mammogram for malignant neoplasm of breast: Secondary | ICD-10-CM | POA: Insufficient documentation

## 2024-01-03 ENCOUNTER — Ambulatory Visit: Payer: Self-pay | Admitting: Family Medicine

## 2024-01-12 ENCOUNTER — Ambulatory Visit: Payer: Self-pay

## 2024-01-12 ENCOUNTER — Ambulatory Visit (INDEPENDENT_AMBULATORY_CARE_PROVIDER_SITE_OTHER)

## 2024-01-12 VITALS — BP 130/60 | HR 86 | Resp 16 | Ht 66.0 in | Wt 197.7 lb

## 2024-01-12 DIAGNOSIS — R3915 Urgency of urination: Secondary | ICD-10-CM | POA: Diagnosis not present

## 2024-01-12 DIAGNOSIS — R300 Dysuria: Secondary | ICD-10-CM

## 2024-01-12 LAB — POCT URINALYSIS DIPSTICK
Bilirubin, UA: NEGATIVE
Blood, UA: NEGATIVE
Glucose, UA: NEGATIVE
Ketones, UA: NEGATIVE
Nitrite, UA: NEGATIVE
Protein, UA: POSITIVE — AB
Spec Grav, UA: 1.01 (ref 1.010–1.025)
Urobilinogen, UA: 0.2 U/dL
pH, UA: 6 (ref 5.0–8.0)

## 2024-01-12 MED ORDER — NITROFURANTOIN MONOHYD MACRO 100 MG PO CAPS: 100.0000 mg | ORAL_CAPSULE | Freq: Two times a day (BID) | ORAL | 0 refills | Status: AC

## 2024-01-12 NOTE — Telephone Encounter (Signed)
 Pt seen today

## 2024-01-12 NOTE — Telephone Encounter (Signed)
 FYI Only or Action Required?: FYI only for provider.  Patient was last seen in primary care on 12/20/2023 by Midge Sober, DO.  Called Nurse Triage reporting Urinary Urgency.  Symptoms began 2-3 days ago.  Interventions attempted: Rest, hydration, or home remedies.  Symptoms are: unchanged.  Triage Disposition: See Physician Within 24 Hours-patient asked for same day appointment.   Patient/caregiver understands and will follow disposition?: Yes  Copied from CRM [id number]. Topic: Clinical - Red Word Triage >> Jan 12, 2024 10:38 AM Jasmin G wrote: [facility] that prompted transfer to Nurse Triage: Possible UTI, pt is experiencing urgency to use the bathroom, cloudy urine accompanied by itchiness and discomfort and a little headache, symptoms started 2-3 days ago and is not getiing better. Reason for Disposition  Urinating more frequently than usual (i.e., frequency) OR new-onset of the feeling of an urgent need to urinate (i.e., urgency)  Answer Assessment - Initial Assessment Questions 1. SYMPTOM: What's the main symptom you're concerned about? (e.g., frequency, incontinence)     Urinary urgency 2. ONSET: When did the  urinary urgency  start?     2-3 days ago 3. PAIN: Is there any pain? If Yes, ask: How bad is it? (Scale: 1-10; mild, moderate, severe)     3 out of 10-patient increases when she feels like she has to go to the bathroom 4. CAUSE: What do you think is causing the symptoms?     Possible UTI 5. OTHER SYMPTOMS: Do you have any other symptoms? (e.g., blood in urine, fever, flank pain, pain with urination)     Pain with urination, doesn't feel like she finishes when she uses the bathroom.  Available appointment today in PCP office. Patient scheduled for 11 AM time slot for today as she was within 2 minutes of the office.  Protocols used: Urinary Symptoms-A-AH

## 2024-01-12 NOTE — Progress Notes (Signed)
 Acute visit   Patient: Krista Cooper   DOB: 1955/11/03   68 y.o. Female  MRN: [id number] PCP: Myrla Jon HERO, MD   Chief Complaint  Patient presents with   Urinary Frequency    pt is experiencing urgency to use the bathroom, cloudy urine accompanied by itchiness and discomfort and a little headache, symptoms started 2-3 days ago and is not getiing better. No otc medication/cranberry juice taken to help.   Subjective     HPI:  Dysuria: - Has been occurring for past 2-3 days - reports urgency, cloudy urine, urinary discomfort - recently increased mounjaro  - no blood in urine - denies fevers, chills, vaginal discharge, denies vaginal odor - hx of diabetes and vaginal dryness  Review of systems as noted in HPI.   Objective    BP 130/60 (BP Location: Left Arm, Patient Position: Sitting, Cuff Size: Large)   Pulse 86   Resp 16   Ht 5' 6 (1.676 m)   Wt 197 lb 11.2 oz (89.7 kg)   SpO2 100%   BMI 31.91 kg/m  Physical Exam Constitutional:      Appearance: Normal appearance.  HENT:     Head: Normocephalic and atraumatic.     Mouth/Throat:     Mouth: Mucous membranes are moist.  Eyes:     Pupils: Pupils are equal, round, and reactive to light.  Pulmonary:     Effort: Pulmonary effort is normal.  Abdominal:     General: Abdomen is flat. There is no distension.     Palpations: Abdomen is soft.     Tenderness: There is no abdominal tenderness. There is no right CVA tenderness or left CVA tenderness.  Skin:    General: Skin is warm.  Neurological:     General: No focal deficit present.     Mental Status: She is alert.       Results for orders placed or performed in visit on 01/12/24  POCT Urinalysis Dipstick  Result Value Ref Range   Color, UA Yellow    Clarity, UA Clear    Glucose, UA Negative Negative   Bilirubin, UA Negative    Ketones, UA Negative    Spec Grav, UA 1.010 1.010 - 1.025   Blood, UA Negative    pH, UA 6.0 5.0 - 8.0   Protein, UA  Positive (A) Negative   Urobilinogen, UA 0.2 0.2 or 1.0 E.U./dL   Nitrite, UA Negative    Leukocytes, UA Small (1+) (A) Negative   Appearance     Odor      Assessment & Plan     Problem List Items Addressed This Visit       Other   Dysuria - Primary   Patient with 3 day hx of dysuria and urinary urgency concerning for acute cystitis. POC urine + for small leukocytes. Denies fevers, chills, no CVA tenderness. Normal kidney function. Given convincing symptoms, will treat as UTI and send urine for culture. - Will start Macrobid  BID for 5 days - Follow up UCx - Return precautions given      Relevant Medications   nitrofurantoin , macrocrystal-monohydrate, (MACROBID ) 100 MG capsule   Other Relevant Orders   POCT Urinalysis Dipstick (Completed)   Urine Culture     Meds ordered this encounter  Medications   nitrofurantoin , macrocrystal-monohydrate, (MACROBID ) 100 MG capsule    Sig: Take 1 capsule (100 mg total) by mouth 2 (two) times daily for 5 days.    Dispense:  10 capsule    Refill:  0     No follow-ups on file.      Isaiah DELENA Pepper, MD  [facility] [phone] (phone) [phone] (fax)

## 2024-01-12 NOTE — Assessment & Plan Note (Signed)
 Patient with 3 day hx of dysuria and urinary urgency concerning for acute cystitis. POC urine + for small leukocytes. Denies fevers, chills, no CVA tenderness. Normal kidney function. Given convincing symptoms, will treat as UTI and send urine for culture. - Will start Macrobid  BID for 5 days - Follow up UCx - Return precautions given

## 2024-01-16 LAB — URINE CULTURE

## 2024-01-16 LAB — SPECIMEN STATUS REPORT

## 2024-01-17 DIAGNOSIS — E119 Type 2 diabetes mellitus without complications: Secondary | ICD-10-CM | POA: Diagnosis not present

## 2024-01-18 ENCOUNTER — Ambulatory Visit: Payer: Self-pay

## 2024-01-20 ENCOUNTER — Encounter (INDEPENDENT_AMBULATORY_CARE_PROVIDER_SITE_OTHER): Payer: Self-pay | Admitting: Family Medicine

## 2024-01-20 ENCOUNTER — Ambulatory Visit (INDEPENDENT_AMBULATORY_CARE_PROVIDER_SITE_OTHER): Admitting: Family Medicine

## 2024-01-20 VITALS — BP 130/70 | HR 77 | Temp 97.9°F | Ht 66.0 in | Wt 191.0 lb

## 2024-01-20 DIAGNOSIS — E66811 Obesity, class 1: Secondary | ICD-10-CM | POA: Diagnosis not present

## 2024-01-20 DIAGNOSIS — Z6830 Body mass index (BMI) 30.0-30.9, adult: Secondary | ICD-10-CM | POA: Diagnosis not present

## 2024-01-20 DIAGNOSIS — Z7985 Long-term (current) use of injectable non-insulin antidiabetic drugs: Secondary | ICD-10-CM | POA: Diagnosis not present

## 2024-01-20 DIAGNOSIS — E1165 Type 2 diabetes mellitus with hyperglycemia: Secondary | ICD-10-CM

## 2024-01-20 MED ORDER — TIRZEPATIDE 7.5 MG/0.5ML ~~LOC~~ SOAJ: 7.5000 mg | SUBCUTANEOUS | 0 refills | Status: DC

## 2024-01-20 NOTE — Progress Notes (Unsigned)
 SUBJECTIVE:  Chief Complaint: Obesity  Interim History: Patient had an urinary tract infection since our last appointment.  She mentions this was her first infection in 30+ years.  She is drinking more water consistently.  She is enjoying [employer]itness [employer] and staying consistent with her journaling.  She is normally under 1500 calories daily. She is getting her protein in daily.  She is being diligent with protein intake. She is going to [facility] mid September 16-29.   Krista Cooper is here to discuss her progress with her obesity treatment plan. She is on the Category 2 Plan and keeping a food journal and adhering to recommended goals of 1400-1500 calories and 95+ grams of protein and states she is following her eating plan approximately 85 % of the time. She states she is exercising 45 minutes 3-4 times per week and walking the dog.   OBJECTIVE: Visit Diagnoses: Problem List Items Addressed This Visit       Endocrine   T2DM (type 2 diabetes mellitus) (HCC) - Primary   Relevant Medications   tirzepatide  (MOUNJARO ) 7.5 MG/0.5ML Pen     Other   Class 1 obesity with serious comorbidity and body mass index (BMI) of 32.0 to 32.9 in adult   Relevant Medications   tirzepatide  (MOUNJARO ) 7.5 MG/0.5ML Pen   Other Visit Diagnoses       BMI 30.0-30.9,adult           Vitals Temp: 97.9 F (36.6 C) BP: 130/70 Pulse Rate: 77 SpO2: 99 %   Anthropometric Measurements Height: 5' 6 (1.676 m) Weight: 191 lb (86.6 kg) BMI (Calculated): 30.84 Weight at Last Visit: 193 lb Weight Lost Since Last Visit: 2 Weight Gained Since Last Visit: 0 Starting Weight: 202 lb Total Weight Loss (lbs): 11 lb (4.99 kg)   Body Composition  Body Fat %: 41.2 % Fat Mass (lbs): 79 lbs Muscle Mass (lbs): 107 lbs Total Body Water (lbs): 73.6 lbs Visceral Fat Rating : 12   Other Clinical Data Today's Visit #: 34 Starting Date: 06/24/21 Comments: Cat 2, 1400-1500/95     ASSESSMENT AND  PLAN: Assessment & Plan Type 2 diabetes mellitus with hyperglycemia, without long-term current use of insulin  (HCC) Patient has done well on Mounjaro  with only a few GI side effects.  She is on the 7.5mg  dose and isn't through her first month on this dose.  We discussed the option of increasing medication or staying at current dose and patient is getting good control without significant side effects so will stay at this dose for now.  Refill sent into pharmacy.  Will re-evaluate dosage at next appointment. Class 1 obesity with serious comorbidity and body mass index (BMI) of 32.0 to 32.9 in adult, unspecified obesity type  BMI 30.0-30.9,adult    Diet: Krista Cooper is currently in the action stage of change. As such, her goal is to continue with weight loss efforts and has agreed to keeping a food journal and adhering to recommended goals of 1400-1500 calories and 95 or more grams of protein daily.   Exercise:  Older adults should determine their level of effort for physical activity relative to their level of fitness.  Behavior Modification:  We discussed the following Behavioral Modification Strategies today: increasing lean protein intake, decreasing simple carbohydrates, increasing vegetables, meal planning and cooking strategies, planning for success, and keep a strict food journal.   Return in about 4 weeks (around 02/17/2024) for fasting labs.   She was informed of the importance of frequent  follow up visits to maximize her success with intensive lifestyle modifications for her multiple health conditions.  Attestation Statements:   Reviewed by clinician on day of visit: allergies, medications, problem list, medical history, surgical history, family history, social history, and previous encounter notes.     Krista Cho, MD

## 2024-01-20 NOTE — Assessment & Plan Note (Signed)
 Patient is on Mounjaro  7.5mg  weekly.  No GI side effects of medication and patient reports that her constipation type effects are improved with fiber tablets.  Needs a refill today.  No change in dose as patient is experiencing good satiety and control with minimal side effects on this dose.

## 2024-01-21 NOTE — Assessment & Plan Note (Signed)
 Patient has done well on Mounjaro  with only a few GI side effects.  She is on the 7.5mg  dose and isn't through her first month on this dose.  We discussed the option of increasing medication or staying at current dose and patient is getting good control without significant side effects so will stay at this dose for now.  Refill sent into pharmacy.  Will re-evaluate dosage at next appointment.

## 2024-01-25 ENCOUNTER — Ambulatory Visit (INDEPENDENT_AMBULATORY_CARE_PROVIDER_SITE_OTHER): Payer: Self-pay

## 2024-01-25 DIAGNOSIS — Z0000 Encounter for general adult medical examination without abnormal findings: Secondary | ICD-10-CM

## 2024-01-25 NOTE — Patient Instructions (Addendum)
 Ms. Carelli,  Thank you for taking the time for your Medicare Wellness Visit. I appreciate your continued commitment to your health goals. Please review the care plan we discussed, and feel free to reach out if I can assist you further.  Medicare recommends these wellness visits once per year to help you and your care team stay ahead of potential health issues. These visits are designed to focus on prevention, allowing your provider to concentrate on managing your acute and chronic conditions during your regular appointments.  Please note that Annual Wellness Visits do not include a physical exam. Some assessments may be limited, especially if the visit was conducted virtually. If needed, we may recommend a separate in-person follow-up with your provider.  Ongoing Care Seeing your primary care provider every 3 to 6 months helps us  monitor your health and provide consistent, personalized care.   Referrals If a referral was made during today's visit and you haven't received any updates within two weeks, please contact the referred provider directly to check on the status.  Recommended Screenings:  Health Maintenance  Topic Date Due   Flu Shot  12/17/2023   COVID-19 Vaccine (9 - 2024-25 season) 01/17/2024   Hemoglobin A1C  03/08/2024   Eye exam for diabetics  03/28/2024   Yearly kidney health urinalysis for diabetes  09/06/2024   Complete foot exam   09/06/2024   Yearly kidney function blood test for diabetes  09/29/2024   Mammogram  12/29/2024   Medicare Annual Wellness Visit  01/24/2025   DTaP/Tdap/Td vaccine (3 - Td or Tdap) 03/19/2026   DEXA scan (bone density measurement)  04/03/2026   Colon Cancer Screening  12/22/2027   Pneumococcal Vaccine for age over 3  Completed   Hepatitis C Screening  Completed   Zoster (Shingles) Vaccine  Completed   HPV Vaccine  Aged Out   Meningitis B Vaccine  Aged Out   Cologuard (Stool DNA test)  Discontinued       01/25/2024   11:00 AM  Advanced  Directives  Does Patient Have a Medical Advance Directive? No  Would patient like information on creating a medical advance directive? No - Patient declined   Advance Care Planning is important because it: Ensures you receive medical care that aligns with your values, goals, and preferences. Provides guidance to your family and loved ones, reducing the emotional burden of decision-making during critical moments.  Vision: Annual vision screenings are recommended for early detection of glaucoma, cataracts, and diabetic retinopathy. These exams can also reveal signs of chronic conditions such as diabetes and high blood pressure.  Dental: Annual dental screenings help detect early signs of oral cancer, gum disease, and other conditions linked to overall health, including heart disease and diabetes.  Please see the attached documents for additional preventive care recommendations.   NEXT AWV 01/30/25 @ 10:50 AM BY PHONE

## 2024-01-25 NOTE — Progress Notes (Signed)
 Subjective:   Krista Cooper is a 68 y.o. who presents for a Medicare Wellness preventive visit.  As a reminder, Annual Wellness Visits don't include a physical exam, and some assessments may be limited, especially if this visit is performed virtually. We may recommend an in-person follow-up visit with your provider if needed.  Visit Complete: Virtual I connected with  Elveria JAYSON Seats on 01/25/24 by a audio enabled telemedicine application and verified that I am speaking with the correct person using two identifiers.  Patient Location: Home  Provider Location: Office/Clinic  I discussed the limitations of evaluation and management by telemedicine. The patient expressed understanding and agreed to proceed.  Vital Signs: Because this visit was a virtual/telehealth visit, some criteria may be missing or patient reported. Any vitals not documented were not able to be obtained and vitals that have been documented are patient reported.  VideoDeclined- This patient declined Librarian, academic. Therefore the visit was completed with audio only.  Persons Participating in Visit: Patient.  AWV Questionnaire: No: Patient Medicare AWV questionnaire was not completed prior to this visit.  Cardiac Risk Factors include: advanced age (>20men, >59 women);diabetes mellitus;dyslipidemia;hypertension;obesity (BMI >30kg/m2)     Objective:    There were no vitals filed for this visit. There is no height or weight on file to calculate BMI.     01/25/2024   11:00 AM 01/20/2023   10:11 AM 12/22/2022    8:47 AM 08/05/2015    2:54 PM 02/15/2015    8:23 AM  Advanced Directives  Does Patient Have a Medical Advance Directive? No No No No  No   Type of Furniture conservator/restorer;Living will     Would patient like information on creating a medical advance directive? No - Patient declined  No - Patient declined       Data saved with a previous flowsheet row  definition    Current Medications (verified) Outpatient Encounter Medications as of 01/25/2024  Medication Sig   ALPRAZolam  (XANAX ) 0.25 MG tablet Take 1 tablet (0.25 mg total) by mouth 2 (two) times daily as needed for anxiety.   amLODipine  (NORVASC ) 10 MG tablet Take 1 tablet (10 mg total) by mouth daily.   Cholecalciferol (VITAMIN D3) 125 MCG (5000 UT) CAPS Take 1 capsule (5,000 Units total) by mouth daily.   Coenzyme Q10 (COQ10) 200 MG CAPS Take 200 mg by mouth daily with breakfast.   fluticasone  (FLONASE ) 50 MCG/ACT nasal spray Use 2 spray(s) in each nostril once daily   Insulin  Pen Needle (BD PEN NEEDLE NANO 2ND GEN) 32G X 4 MM MISC 1 Package by Does not apply route 2 (two) times daily.   levocetirizine (XYZAL ) 5 MG tablet TAKE 1 TABLET BY MOUTH ONCE DAILY IN THE EVENING   MAGNESIUM GLYCINATE PO Take 240 mg by mouth.   MULTIPLE VITAMIN PO Take 1 tablet by mouth daily.   Omega-3 Fatty Acids (FISH OIL ) 1200 MG CAPS Take 2 capsules (2,400 mg total) by mouth daily.   Psyllium Fiber 0.52 g CAPS Take by mouth.   rosuvastatin  (CRESTOR ) 40 MG tablet Take 1 tablet (40 mg total) by mouth daily.   sertraline  (ZOLOFT ) 50 MG tablet Take 1 tablet (50 mg total) by mouth daily.   tirzepatide  (MOUNJARO ) 7.5 MG/0.5ML Pen Inject 7.5 mg into the skin once a week.   No facility-administered encounter medications on file as of 01/25/2024.    Allergies (verified) Codeine, Lisinopril, and Morphine and codeine  History: Past Medical History:  Diagnosis Date   Allergy    Anxiety    Diabetes mellitus without complication (HCC)    Hyperlipidemia    Hypertension    Obesity    Prediabetes    Seasonal allergies    Past Surgical History:  Procedure Laterality Date   CESAREAN SECTION     COLONOSCOPY     COLONOSCOPY WITH PROPOFOL  N/A 12/22/2022   Procedure: COLONOSCOPY WITH PROPOFOL ;  Surgeon: Jinny Carmine, MD;  Location: [facility] ENDOSCOPY;  Service: Endoscopy;  Laterality: N/A;   FRACTURE SURGERY Right  05/19/1995   PLATES AND PINS   MOUTH SURGERY  05/18/1974   WISDOM TEETH REMOVED   POLYPECTOMY  12/22/2022   Procedure: POLYPECTOMY;  Surgeon: Jinny Carmine, MD;  Location: [facility] ENDOSCOPY;  Service: Endoscopy;;   Family History  Problem Relation Age of Onset   Diabetes Mother    Hyperlipidemia Mother    COPD Mother    Hypertension Mother    Obesity Mother    Cancer Father    Congestive Heart Failure Father    Hypertension Father    Obesity Father    Diabetes Paternal Grandfather    CVA Maternal Grandmother    Stroke Maternal Grandmother    Arthritis Paternal Aunt    Breast cancer Neg Hx    Social History   Socioeconomic History   Marital status: Widowed    Spouse name: Not on file   Number of children: Not on file   Years of education: Not on file   Highest education level: Some college, no degree  Occupational History   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   Drug use: Never   Sexual activity: Not Currently    Birth control/protection: None  Other Topics Concern   Not on file  Social History Narrative   Not on file   Social Drivers of Health   Financial Resource Strain: Low Risk  (01/25/2024)   Overall Financial Resource Strain (CARDIA)    Difficulty of Paying Living Expenses: Not hard at all  Food Insecurity: No Food Insecurity (01/25/2024)   Hunger Vital Sign    Worried About Running Out of Food in the Last Year: Never true    Ran Out of Food in the Last Year: Never true  Transportation Needs: No Transportation Needs (01/25/2024)   PRAPARE - Administrator, Civil Service (Medical): No    Lack of Transportation (Non-Medical): No  Physical Activity: Sufficiently Active (01/25/2024)    Exercise Vital Sign    Days of Exercise per Week: 4 days    Minutes of Exercise per Session: 50 min  Stress: No Stress Concern Present (01/25/2024)   [employer] of Occupational Health - Occupational Stress Questionnaire    Feeling of Stress: Not at all  Social Connections: Unknown (01/25/2024)   Social Connection and Isolation Panel    Frequency of Communication with Friends and Family: Not on file    Frequency of Social Gatherings with Friends and Family: Not on file    Attends Religious Services: Not on file    Active Member of Clubs or Organizations: Not on file    Attends Club or  Organization Meetings: More than 4 times per year    Marital Status: Not on file    Tobacco Counseling Counseling given: Not Answered    Clinical Intake:  Pre-visit preparation completed: Yes  Pain : No/denies pain     BMI - recorded: 30.8 Nutritional Status: BMI > 30  Obese Nutritional Risks: None Diabetes: Yes CBG done?: No Did pt. bring in CBG monitor from home?: No  Lab Results  Component Value Date   HGBA1C 6.1 (H) 09/07/2023   HGBA1C 6.4 (H) 03/12/2023   HGBA1C 5.9 (A) 09/01/2022     How often do you need to have someone help you when you read instructions, pamphlets, or other written materials from your doctor or pharmacy?: 1 - Never  Interpreter Needed?: No  Information entered by :: JHONNIE DAS, LPN   Activities of Daily Living     01/25/2024   11:03 AM 01/21/2024    8:59 AM  In your present state of health, do you have any difficulty performing the following activities:  Hearing? 1 0  Vision? 0 0  Difficulty concentrating or making decisions? 0 0  Walking or climbing stairs? 0 0  Dressing or bathing? 0 0  Doing errands, shopping? 0 0  Preparing Food and eating ? N N  Using the Toilet? N N  In the past six months, have you accidently leaked urine? N N  Do you have problems with loss of bowel control? N N  Managing your Medications? N N  Managing your  Finances? N N  Housekeeping or managing your Housekeeping? N N    Patient Care Team: Myrla Jon HERO, MD as PCP - General (Family Medicine) Jaye Fallow, MD as Referring Physician (Ophthalmology)  I have updated your Care Teams any recent Medical Services you may have received from other providers in the past year.     Assessment:   This is a routine wellness examination for Reinbeck.  Hearing/Vision screen Hearing Screening - Comments:: WEARS AIDS, BOTH EARS Vision Screening - Comments:: WEARS GLASSES ALL DAY- DR.PORFILIO- APPT IN NOVEMBER   Goals Addressed             This Visit's Progress    DIET - EAT MORE FRUITS AND VEGETABLES         Depression Screen     01/25/2024   10:58 AM 09/07/2023    1:04 PM 03/05/2023    9:54 AM 01/20/2023   10:10 AM 03/10/2022   10:26 AM 03/10/2022   10:25 AM 08/26/2021   11:05 AM  PHQ 2/9 Scores  PHQ - 2 Score 0 0 0 0 0 0 0  PHQ- 9 Score 0    0 0 0    Fall Risk     01/21/2024    8:59 AM 09/07/2023    1:04 PM 01/16/2023    9:24 AM 03/10/2022   10:24 AM 03/09/2022   10:45 AM  Fall Risk   Falls in the past year? 0 0 0 0 0  Number falls in past yr: 0 0  0   Injury with Fall? 0 0   0  Risk for fall due to : No Fall Risks No Fall Risks No Fall Risks No Fall Risks   Follow up Falls evaluation completed;Falls prevention discussed Falls evaluation completed Falls prevention discussed;Education provided Falls evaluation completed       Data saved with a previous flowsheet row definition    MEDICARE RISK AT HOME:  Medicare Risk at Home Any stairs  in or around the home?: Yes If so, are there any without handrails?: No Home free of loose throw rugs in walkways, pet beds, electrical cords, etc?: Yes Adequate lighting in your home to reduce risk of falls?: Yes Life alert?: No Use of a cane, walker or w/c?: No Grab bars in the bathroom?: Yes Shower chair or bench in shower?: Yes Elevated toilet seat or a handicapped toilet?:  No  TIMED UP AND GO:  Was the test performed?  No  Cognitive Function: 6CIT completed        01/25/2024   11:05 AM 01/20/2023   10:12 AM  6CIT Screen  What Year? 0 points 0 points  What month? 0 points 0 points  What time? 0 points 0 points  Count back from 20 0 points 0 points  Months in reverse 0 points 0 points  Repeat phrase 0 points 0 points  Total Score 0 points 0 points    Immunizations Immunization History  Administered Date(s) Administered   Fluad Quad(high Dose 65+) 03/07/2021, 03/10/2022   Fluad Trivalent(High Dose 65+) 03/05/2023   Influenza,inj,Quad PF,6+ Mos 02/02/2014, 02/15/2015, 03/19/2016, 03/28/2018, 04/03/2019, 04/03/2020   Influenza-Unspecified 03/16/2017   PFIZER Comirnaty(Gray Top)Covid-19 Tri-Sucrose Vaccine 05/27/2020, 09/27/2020, 02/10/2021, 02/16/2022   PFIZER(Purple Top)SARS-COV-2 Vaccination 11/06/2019, 11/27/2019   PNEUMOCOCCAL CONJUGATE-20 12/23/2020   Pfizer Covid-19 Vaccine Bivalent Booster 21yrs & up 02/16/2022, 10/14/2022   Pfizer(Comirnaty)Fall Seasonal Vaccine 12 years and older 03/26/2023   Rsv, Mab, Nirsevimab-alip, 1 Ml, Neonate To 24 Mos(Beyfortus) 04/08/2023   Td 03/19/2016   Tdap 10/15/2005   Zoster Recombinant(Shingrix ) 09/30/2018, 12/12/2018    Screening Tests Health Maintenance  Topic Date Due   Influenza Vaccine  12/17/2023   COVID-19 Vaccine (9 - 2024-25 season) 01/17/2024   HEMOGLOBIN A1C  03/08/2024   OPHTHALMOLOGY EXAM  03/28/2024   Diabetic kidney evaluation - Urine ACR  09/06/2024   FOOT EXAM  09/06/2024   Diabetic kidney evaluation - eGFR measurement  09/29/2024   MAMMOGRAM  12/29/2024   Medicare Annual Wellness (AWV)  01/24/2025   DTaP/Tdap/Td (3 - Td or Tdap) 03/19/2026   DEXA SCAN  04/03/2026   Colonoscopy  12/22/2027   Pneumococcal Vaccine: 50+ Years  Completed   Hepatitis C Screening  Completed   Zoster Vaccines- Shingrix   Completed   HPV VACCINES  Aged Out   Meningococcal B Vaccine  Aged Out   Fecal  DNA (Cologuard)  Discontinued    Health Maintenance Items Addressed: UP TO DATE ON SHOTS; UP TO DATE ON MAMMOGRAM, COLONOSCOPY & BDS  Additional Screening:  Vision Screening: Recommended annual ophthalmology exams for early detection of glaucoma and other disorders of the eye. Is the patient up to date with their annual eye exam?  Yes  Who is the provider or what is the name of the office in which the patient attends annual eye exams? DR.PORFILIO  Dental Screening: Recommended annual dental exams for proper oral hygiene  Community Resource Referral / Chronic Care Management: CRR required this visit?  No   CCM required this visit?  No   Plan:    I have personally reviewed and noted the following in the patient's chart:   Medical and social history Use of alcohol, tobacco or illicit drugs  Current medications and supplements including opioid prescriptions. Patient is not currently taking opioid prescriptions. Functional ability and status Nutritional status Physical activity Advanced directives List of other physicians Hospitalizations, surgeries, and ER visits in previous 12 months Vitals Screenings to include cognitive, depression, and falls  Referrals and appointments  In addition, I have reviewed and discussed with patient certain preventive protocols, quality metrics, and best practice recommendations. A written personalized care plan for preventive services as well as general preventive health recommendations were provided to patient.   Jhonnie GORMAN Das, LPN   [id number]   After Visit Summary: (MyChart) Due to this being a telephonic visit, the after visit summary with patients personalized plan was offered to patient via MyChart   Notes: Nothing significant to report at this time.

## 2024-01-28 ENCOUNTER — Other Ambulatory Visit: Payer: Self-pay | Admitting: Family Medicine

## 2024-02-16 DIAGNOSIS — E119 Type 2 diabetes mellitus without complications: Secondary | ICD-10-CM | POA: Diagnosis not present

## 2024-02-17 ENCOUNTER — Ambulatory Visit (INDEPENDENT_AMBULATORY_CARE_PROVIDER_SITE_OTHER): Admitting: Family Medicine

## 2024-02-17 ENCOUNTER — Encounter (INDEPENDENT_AMBULATORY_CARE_PROVIDER_SITE_OTHER): Payer: Self-pay | Admitting: Family Medicine

## 2024-02-17 VITALS — BP 127/72 | HR 85 | Temp 97.7°F | Ht 66.0 in | Wt 191.0 lb

## 2024-02-17 DIAGNOSIS — Z6832 Body mass index (BMI) 32.0-32.9, adult: Secondary | ICD-10-CM

## 2024-02-17 DIAGNOSIS — E1165 Type 2 diabetes mellitus with hyperglycemia: Secondary | ICD-10-CM

## 2024-02-17 DIAGNOSIS — E66811 Obesity, class 1: Secondary | ICD-10-CM | POA: Diagnosis not present

## 2024-02-17 DIAGNOSIS — E1169 Type 2 diabetes mellitus with other specified complication: Secondary | ICD-10-CM

## 2024-02-17 DIAGNOSIS — Z7985 Long-term (current) use of injectable non-insulin antidiabetic drugs: Secondary | ICD-10-CM

## 2024-02-17 DIAGNOSIS — E785 Hyperlipidemia, unspecified: Secondary | ICD-10-CM

## 2024-02-17 DIAGNOSIS — Z6830 Body mass index (BMI) 30.0-30.9, adult: Secondary | ICD-10-CM

## 2024-02-17 MED ORDER — TIRZEPATIDE 7.5 MG/0.5ML ~~LOC~~ SOAJ: 7.5000 mg | SUBCUTANEOUS | 0 refills | Status: DC

## 2024-02-17 NOTE — Assessment & Plan Note (Signed)
 Patient is on Crestor  40 mg tablet daily.  She reports no side effects of this medication.  Repeat fasting lipid panel today to discuss labs and risk stratify at next appointment.

## 2024-02-17 NOTE — Assessment & Plan Note (Signed)
 Patient is tolerating Mounjaro  at current dosage.  She does report very minimal if any side effects.  Will continue on Mounjaro  7.5 mg and refill was sent into pharmacy today.  Will repeat CMP, A1c, and insulin  level today to assess response to treatment.  Plan to discuss lab results at next appointment.

## 2024-02-17 NOTE — Progress Notes (Signed)
 SUBJECTIVE:  Chief Complaint: Obesity  Interim History: Last two weeks patient has been at [facility] enjoying herself in the warmth and sun.  She has really enjoyed herself while away.  She did occasionally eat slightly more indulgently and had a fruity drink once a day.  She has gotten back into her activity since returning home.  She is getting back to eating more consistently nutritious.  Want to really recommit to her meal plan and exercise routine to get to less than 190 by next appointment.  She has food long with her today. Sometimes she is short 100-200 calories and is aiming toward 95 grams of protein a day. Average calorie intake 1150/1200 and protein average at least 90 grams a day (most days over).   Krista Cooper is here to discuss her progress with her obesity treatment plan. She is on the keeping a food journal and adhering to recommended goals of 1400-1500 calories and 95 grams of protein and states she is following her eating plan approximately 90-95 % of the time. She states she is not exercising for past 2 weeks.  OBJECTIVE: Visit Diagnoses: Problem List Items Addressed This Visit       Endocrine   Hyperlipidemia associated with type 2 diabetes mellitus (HCC) - Primary   Relevant Medications   tirzepatide  (MOUNJARO ) 7.5 MG/0.5ML Pen   Other Relevant Orders   Lipid Panel With LDL/HDL Ratio   T2DM (type 2 diabetes mellitus) (HCC)   Relevant Medications   tirzepatide  (MOUNJARO ) 7.5 MG/0.5ML Pen   Other Relevant Orders   Comprehensive metabolic panel with GFR   Hemoglobin A1c   Insulin , random   Other Visit Diagnoses       BMI 30.0-30.9,adult         Class 1 obesity without serious comorbidity with body mass index (BMI) of 32.0 to 32.9 in adult, unspecified obesity type       Relevant Medications   tirzepatide  (MOUNJARO ) 7.5 MG/0.5ML Pen       Vitals Temp: 97.7 F (36.5 C) BP: 127/72 Pulse Rate: 85 SpO2: 98 %   Anthropometric Measurements Height: 5' 6  (1.676 m) Weight: 191 lb (86.6 kg) BMI (Calculated): 30.84 Weight at Last Visit: 191 lb Weight Lost Since Last Visit: 0 Weight Gained Since Last Visit: 0 Starting Weight: 202 lb Total Weight Loss (lbs): 11 lb (4.99 kg)   Body Composition  Body Fat %: 41.8 % Fat Mass (lbs): 80 lbs Muscle Mass (lbs): 105.6 lbs Total Body Water (lbs): 73.4 lbs Visceral Fat Rating : 12   Other Clinical Data Fasting: yes Labs: yes Today's Visit #: 35 Starting Date: 06/24/21 Comments: 1400-1500/95     ASSESSMENT AND PLAN: Assessment & Plan Type 2 diabetes mellitus with hyperglycemia, without long-term current use of insulin  (HCC) Patient is tolerating Mounjaro  at current dosage.  She does report very minimal if any side effects.  Will continue on Mounjaro  7.5 mg and refill was sent into pharmacy today.  Will repeat CMP, A1c, and insulin  level today to assess response to treatment.  Plan to discuss lab results at next appointment. Hyperlipidemia associated with type 2 diabetes mellitus (HCC) Patient is on Crestor  40 mg tablet daily.  She reports no side effects of this medication.  Repeat fasting lipid panel today to discuss labs and risk stratify at next appointment. BMI 30.0-30.9,adult  Class 1 obesity without serious comorbidity with body mass index (BMI) of 32.0 to 32.9 in adult, unspecified obesity type    Diet: Cris is  currently in the action stage of change. As such, her goal is to continue with weight loss efforts and has agreed to keeping a food journal and adhering to recommended goals of 1400-1500 calories and 95 or more grams of protein daily  Exercise:  Older adults should determine their level of effort for physical activity relative to their level of fitness.  Behavior Modification:  We discussed the following Behavioral Modification Strategies today: increasing lean protein intake, decreasing simple carbohydrates, increasing vegetables, meal planning and cooking strategies,  and planning for success.   Return in about 5 weeks (around 03/23/2024).   She was informed of the importance of frequent follow up visits to maximize her success with intensive lifestyle modifications for her multiple health conditions.  Attestation Statements:   Reviewed by clinician on day of visit: allergies, medications, problem list, medical history, surgical history, family history, social history, and previous encounter notes.   Adelita Cho, MD

## 2024-02-18 LAB — COMPREHENSIVE METABOLIC PANEL WITH GFR
ALT: 26 IU/L (ref 0–32)
AST: 29 IU/L (ref 0–40)
Albumin: 4.6 g/dL (ref 3.9–4.9)
Alkaline Phosphatase: 77 IU/L (ref 49–135)
BUN/Creatinine Ratio: 30 — ABNORMAL HIGH (ref 12–28)
BUN: 21 mg/dL (ref 8–27)
Bilirubin Total: 0.5 mg/dL (ref 0.0–1.2)
CO2: 21 mmol/L (ref 20–29)
Calcium: 9.7 mg/dL (ref 8.7–10.3)
Chloride: 100 mmol/L (ref 96–106)
Creatinine, Ser: 0.71 mg/dL (ref 0.57–1.00)
Globulin, Total: 2 g/dL (ref 1.5–4.5)
Glucose: 92 mg/dL (ref 70–99)
Potassium: 4.5 mmol/L (ref 3.5–5.2)
Sodium: 136 mmol/L (ref 134–144)
Total Protein: 6.6 g/dL (ref 6.0–8.5)
eGFR: 93 mL/min/1.73 (ref >59)

## 2024-02-18 LAB — LIPID PANEL WITH LDL/HDL RATIO
Cholesterol, Total: 171 mg/dL (ref 100–199)
HDL: 62 mg/dL (ref >39)
LDL Chol Calc (NIH): 95 mg/dL (ref 0–99)
LDL/HDL Ratio: 1.5 ratio (ref 0.0–3.2)
Triglycerides: 75 mg/dL (ref 0–149)
VLDL Cholesterol Cal: 14 mg/dL (ref 5–40)

## 2024-02-18 LAB — HEMOGLOBIN A1C
Est. average glucose Bld gHb Est-mCnc: 123 mg/dL
Hgb A1c MFr Bld: 5.9 % — ABNORMAL HIGH (ref 4.8–5.6)

## 2024-02-18 LAB — INSULIN, RANDOM: INSULIN: 8.3 u[IU]/mL (ref 2.6–24.9)

## 2024-03-06 ENCOUNTER — Ambulatory Visit: Admitting: Family Medicine

## 2024-03-06 ENCOUNTER — Encounter: Payer: Self-pay | Admitting: Family Medicine

## 2024-03-06 VITALS — BP 117/71 | HR 89 | Ht 66.0 in | Wt 197.5 lb

## 2024-03-06 DIAGNOSIS — Z7985 Long-term (current) use of injectable non-insulin antidiabetic drugs: Secondary | ICD-10-CM

## 2024-03-06 DIAGNOSIS — E785 Hyperlipidemia, unspecified: Secondary | ICD-10-CM

## 2024-03-06 DIAGNOSIS — Z23 Encounter for immunization: Secondary | ICD-10-CM | POA: Diagnosis not present

## 2024-03-06 DIAGNOSIS — I152 Hypertension secondary to endocrine disorders: Secondary | ICD-10-CM

## 2024-03-06 DIAGNOSIS — Z0001 Encounter for general adult medical examination with abnormal findings: Secondary | ICD-10-CM

## 2024-03-06 DIAGNOSIS — F419 Anxiety disorder, unspecified: Secondary | ICD-10-CM

## 2024-03-06 DIAGNOSIS — E1169 Type 2 diabetes mellitus with other specified complication: Secondary | ICD-10-CM | POA: Diagnosis not present

## 2024-03-06 DIAGNOSIS — E1159 Type 2 diabetes mellitus with other circulatory complications: Secondary | ICD-10-CM

## 2024-03-06 DIAGNOSIS — Z0000 Encounter for general adult medical examination without abnormal findings: Secondary | ICD-10-CM

## 2024-03-06 MED ORDER — LEVOCETIRIZINE DIHYDROCHLORIDE 5 MG PO TABS: 5.0000 mg | ORAL_TABLET | Freq: Every evening | ORAL | 1 refills | Status: AC

## 2024-03-06 MED ORDER — AMLODIPINE BESYLATE 10 MG PO TABS: 10.0000 mg | ORAL_TABLET | Freq: Every day | ORAL | 1 refills | Status: AC

## 2024-03-06 MED ORDER — ROSUVASTATIN CALCIUM 40 MG PO TABS: 40.0000 mg | ORAL_TABLET | Freq: Every day | ORAL | 1 refills | Status: AC

## 2024-03-06 NOTE — Assessment & Plan Note (Signed)
 Well-managed with current statin therapy. Recent labs show improved cholesterol levels. - Continue current statin therapy

## 2024-03-06 NOTE — Assessment & Plan Note (Signed)
 Managed with sertraline  50 mg daily. Xanax  is used sparingly during high-stress events such as church performances. - Continue sertraline  50 mg daily - Refill Xanax  as needed, but no immediate refill required

## 2024-03-06 NOTE — Progress Notes (Signed)
 Complete physical exam   Patient: Krista Cooper   DOB: 1955/11/20   67 y.o. Female  MRN: [id number] Visit Date: 03/06/2024  Today's healthcare provider: Jon Eva, MD   Chief Complaint  Patient presents with   Annual Exam    Last completed 03/05/23, AWV completed 01/25/24 Diet - high protein low carb Exercise - 5-6 days a week walking 30 minutes, yoga (60 mins) and aerobics (45 min) about 3 days a week  Feeling - well Sleeping - well Concerns -  none, provided home BP readings and avg glucose readings from her monitor   Subjective    Krista Cooper is a 68 y.o. female who presents today for a complete physical exam.     Discussed the use of AI scribe software for clinical note transcription with the patient, who gave verbal consent to proceed.  History of Present Illness   Krista Cooper is a 68 year old female who presents for an annual physical exam.  She monitors her blood pressure and blood sugar at home. Her 90-day blood sugar average is 107 mg/dL, with a highest reading of 125 mg/dL, and a 7-day average of 104 mg/dL. Home blood pressure readings are generally in the 110s to 120s, occasionally reaching the 130s.  She manages diabetes with Mounjaro  7.5 mg, achieving an A1c of 5.9%. Constipation occurs as a side effect, managed with fiber pills. Hypertension is controlled with amlodipine  10 mg daily. For hyperlipidemia, she takes atorvastatin 40 mg daily and is satisfied with her cholesterol levels.  Anxiety is managed with sertraline  50 mg daily and Xanax  as needed, with minimal use. She previously experienced low sodium levels, which resolved after stopping triamterene  and hydrochlorothiazide. She consumes about 90 ounces of water daily.  She recently started using hearing aids and is adjusting to them, experiencing some soreness. She had a UTI over the summer, attributed to dehydration, with no recent occurrences.        Last depression screening  scores    01/25/2024   10:58 AM 09/07/2023    1:04 PM 03/05/2023    9:54 AM  PHQ 2/9 Scores  PHQ - 2 Score 0 0 0  PHQ- 9 Score 0     Last fall risk screening    01/21/2024    8:59 AM  Fall Risk   Falls in the past year? 0  Number falls in past yr: 0  Injury with Fall? 0  Risk for fall due to : No Fall Risks  Follow up Falls evaluation completed;Falls prevention discussed        Medications: Outpatient Medications Prior to Visit  Medication Sig   ALPRAZolam  (XANAX ) 0.25 MG tablet Take 1 tablet (0.25 mg total) by mouth 2 (two) times daily as needed for anxiety.   Cholecalciferol (VITAMIN D3) 125 MCG (5000 UT) CAPS Take 1 capsule (5,000 Units total) by mouth daily.   Coenzyme Q10 (COQ10) 200 MG CAPS Take 200 mg by mouth daily with breakfast.   fluticasone  (FLONASE ) 50 MCG/ACT nasal spray Use 2 spray(s) in each nostril once daily   Insulin  Pen Needle (BD PEN NEEDLE NANO 2ND GEN) 32G X 4 MM MISC 1 Package by Does not apply route 2 (two) times daily.   MAGNESIUM GLYCINATE PO Take 240 mg by mouth.   MULTIPLE VITAMIN PO Take 1 tablet by mouth daily.   Omega-3 Fatty Acids (FISH OIL ) 1200 MG CAPS Take 2 capsules (2,400 mg total) by mouth daily.   Psyllium  Fiber 0.52 g CAPS Take by mouth.   sertraline  (ZOLOFT ) 50 MG tablet Take 1 tablet (50 mg total) by mouth daily.   tirzepatide  (MOUNJARO ) 7.5 MG/0.5ML Pen Inject 7.5 mg into the skin once a week.   [DISCONTINUED] amLODipine  (NORVASC ) 10 MG tablet Take 1 tablet (10 mg total) by mouth daily.   [DISCONTINUED] levocetirizine (XYZAL ) 5 MG tablet TAKE 1 TABLET BY MOUTH ONCE DAILY IN THE EVENING   [DISCONTINUED] rosuvastatin  (CRESTOR ) 40 MG tablet Take 1 tablet (40 mg total) by mouth daily.   No facility-administered medications prior to visit.    Review of Systems    Objective    BP 117/71 Comment: home reading  Pulse 89   Ht 5' 6 (1.676 m)   Wt 197 lb 8 oz (89.6 kg)   SpO2 100%   BMI 31.88 kg/m    Physical Exam Vitals  reviewed.  Constitutional:      General: She is not in acute distress.    Appearance: Normal appearance. She is well-developed. She is not diaphoretic.  HENT:     Head: Normocephalic and atraumatic.     Right Ear: Tympanic membrane, ear canal and external ear normal.     Left Ear: Tympanic membrane, ear canal and external ear normal.     Nose: Nose normal.     Mouth/Throat:     Mouth: Mucous membranes are moist.     Pharynx: Oropharynx is clear. No oropharyngeal exudate.  Eyes:     General: No scleral icterus.    Conjunctiva/sclera: Conjunctivae normal.     Pupils: Pupils are equal, round, and reactive to light.  Neck:     Thyroid : No thyromegaly.  Cardiovascular:     Rate and Rhythm: Normal rate and regular rhythm.     Heart sounds: Normal heart sounds. No murmur heard. Pulmonary:     Effort: Pulmonary effort is normal. No respiratory distress.     Breath sounds: Normal breath sounds. No wheezing or rales.  Abdominal:     General: There is no distension.     Palpations: Abdomen is soft.     Tenderness: There is no abdominal tenderness.  Musculoskeletal:        General: No deformity.     Cervical back: Neck supple.     Right lower leg: No edema.     Left lower leg: No edema.  Lymphadenopathy:     Cervical: No cervical adenopathy.  Skin:    General: Skin is warm and dry.     Findings: No rash.  Neurological:     Mental Status: She is alert and oriented to person, place, and time. Mental status is at baseline.     Gait: Gait normal.  Psychiatric:        Mood and Affect: Mood normal.        Behavior: Behavior normal.        Thought Content: Thought content normal.      No results found for any visits on 03/06/24.  Assessment & Plan    Routine Health Maintenance and Physical Exam  Exercise Activities and Dietary recommendations  Goals      DIET - EAT MORE FRUITS AND VEGETABLES     Patient Stated     I would like to lose 10lbs by next year         Immunization History  Administered Date(s) Administered   Fluad Quad(high Dose 65+) 03/07/2021, 03/10/2022   Fluad Trivalent(High Dose 65+) 03/05/2023   INFLUENZA, HIGH DOSE  SEASONAL PF 03/06/2024   Influenza,inj,Quad PF,6+ Mos 02/02/2014, 02/15/2015, 03/19/2016, 03/28/2018, 04/03/2019, 04/03/2020   Influenza-Unspecified 03/16/2017   PFIZER Comirnaty(Gray Top)Covid-19 Tri-Sucrose Vaccine 05/27/2020, 09/27/2020, 02/10/2021, 02/16/2022   PFIZER(Purple Top)SARS-COV-2 Vaccination 11/06/2019, 11/27/2019   PNEUMOCOCCAL CONJUGATE-20 12/23/2020   Pfizer Covid-19 Vaccine Bivalent Booster 54yrs & up 02/16/2022, 10/14/2022   Pfizer(Comirnaty)Fall Seasonal Vaccine 12 years and older 03/26/2023   Rsv, Mab, Nirsevimab-alip, 1 Ml, Neonate To 24 Mos(Beyfortus) 04/08/2023   Td 03/19/2016   Tdap 10/15/2005   Zoster Recombinant(Shingrix ) 09/30/2018, 12/12/2018    Health Maintenance  Topic Date Due   COVID-19 Vaccine (9 - 2025-26 season) 01/17/2024   OPHTHALMOLOGY EXAM  03/28/2024   HEMOGLOBIN A1C  08/17/2024   Diabetic kidney evaluation - Urine ACR  09/06/2024   FOOT EXAM  09/06/2024   Mammogram  12/29/2024   Medicare Annual Wellness (AWV)  01/24/2025   Diabetic kidney evaluation - eGFR measurement  02/16/2025   DTaP/Tdap/Td (3 - Td or Tdap) 03/19/2026   DEXA SCAN  04/03/2026   Colonoscopy  12/22/2027   Pneumococcal Vaccine: 50+ Years  Completed   Influenza Vaccine  Completed   Hepatitis C Screening  Completed   Zoster Vaccines- Shingrix   Completed   Meningococcal B Vaccine  Aged Out   Fecal DNA (Cologuard)  Discontinued    Discussed health benefits of physical activity, and encouraged her to engage in regular exercise appropriate for her age and condition.  Problem List Items Addressed This Visit       Cardiovascular and Mediastinum   Hypertension associated with diabetes (HCC)   Well-controlled with home blood pressure readings primarily in the 110s and 120s, occasional 130s.  Higher readings in clinical settings attributed to white coat syndrome. - Continue amlodipine  10 mg daily      Relevant Medications   amLODipine  (NORVASC ) 10 MG tablet   rosuvastatin  (CRESTOR ) 40 MG tablet     Endocrine   Hyperlipidemia associated with type 2 diabetes mellitus (HCC)   Well-managed with current statin therapy. Recent labs show improved cholesterol levels. - Continue current statin therapy      Relevant Medications   amLODipine  (NORVASC ) 10 MG tablet   rosuvastatin  (CRESTOR ) 40 MG tablet   T2DM (type 2 diabetes mellitus) (HCC)   Well-controlled with an A1c of 5.9%. Mounjaro  is effective in managing blood sugar levels. Constipation as a side effect is managed with fiber supplements. - Continue Mounjaro  7.5 mg - Consider increasing Mounjaro  to 10 mg for weight loss after discussing with endocrinologist in November - Continue fiber supplements as needed      Relevant Medications   rosuvastatin  (CRESTOR ) 40 MG tablet     Other   Anxiety   Managed with sertraline  50 mg daily. Xanax  is used sparingly during high-stress events such as church performances. - Continue sertraline  50 mg daily - Refill Xanax  as needed, but no immediate refill required      Other Visit Diagnoses       Immunization due    -  Primary   Relevant Orders   Flu vaccine HIGH DOSE PF(Fluzone Trivalent) (Completed)     Encounter for annual physical exam               Adult Wellness Visit Blood pressure and blood sugar are well-controlled. Recent labs show improved cholesterol levels and an A1c of 5.9%. - Continue current medications and lifestyle modifications - Schedule six-month follow-up  Hearing Loss with hearing aids Managed with hearing aids. Some discomfort due to size and interaction  with glasses. - Monitor for adaptation to hearing aids  General Health Maintenance Routine screenings and vaccinations are up to date. Mammogram in August, eye exam due in November, colonoscopy  due in 2029. Vaccinations including shingles, pneumonia, flu, and RSV are current. - Schedule eye exam in November - Receive COVID booster two weeks after flu shot        Return in about 6 months (around 09/04/2024) for chronic disease f/u.     Jon Eva, MD  [facility] [phone] (phone) [phone] (fax)  [facility]

## 2024-03-06 NOTE — Assessment & Plan Note (Signed)
 Well-controlled with an A1c of 5.9%. Mounjaro  is effective in managing blood sugar levels. Constipation as a side effect is managed with fiber supplements. - Continue Mounjaro  7.5 mg - Consider increasing Mounjaro  to 10 mg for weight loss after discussing with endocrinologist in November - Continue fiber supplements as needed

## 2024-03-06 NOTE — Assessment & Plan Note (Signed)
 Well-controlled with home blood pressure readings primarily in the 110s and 120s, occasional 130s. Higher readings in clinical settings attributed to white coat syndrome. - Continue amlodipine  10 mg daily

## 2024-03-18 DIAGNOSIS — E119 Type 2 diabetes mellitus without complications: Secondary | ICD-10-CM | POA: Diagnosis not present

## 2024-03-22 ENCOUNTER — Ambulatory Visit (INDEPENDENT_AMBULATORY_CARE_PROVIDER_SITE_OTHER): Payer: Self-pay | Admitting: Family Medicine

## 2024-03-22 ENCOUNTER — Encounter (INDEPENDENT_AMBULATORY_CARE_PROVIDER_SITE_OTHER): Payer: Self-pay | Admitting: Family Medicine

## 2024-03-22 VITALS — BP 144/70 | HR 84 | Temp 98.2°F | Ht 66.0 in | Wt 195.0 lb

## 2024-03-22 DIAGNOSIS — Z7985 Long-term (current) use of injectable non-insulin antidiabetic drugs: Secondary | ICD-10-CM | POA: Diagnosis not present

## 2024-03-22 DIAGNOSIS — E66811 Obesity, class 1: Secondary | ICD-10-CM

## 2024-03-22 DIAGNOSIS — I152 Hypertension secondary to endocrine disorders: Secondary | ICD-10-CM

## 2024-03-22 DIAGNOSIS — Z6831 Body mass index (BMI) 31.0-31.9, adult: Secondary | ICD-10-CM

## 2024-03-22 DIAGNOSIS — E1159 Type 2 diabetes mellitus with other circulatory complications: Secondary | ICD-10-CM | POA: Diagnosis not present

## 2024-03-22 DIAGNOSIS — E1165 Type 2 diabetes mellitus with hyperglycemia: Secondary | ICD-10-CM | POA: Diagnosis not present

## 2024-03-22 DIAGNOSIS — Z6832 Body mass index (BMI) 32.0-32.9, adult: Secondary | ICD-10-CM

## 2024-03-22 MED ORDER — TIRZEPATIDE 7.5 MG/0.5ML ~~LOC~~ SOAJ: 7.5000 mg | SUBCUTANEOUS | 1 refills | Status: DC

## 2024-03-22 NOTE — Progress Notes (Signed)
 SUBJECTIVE:  Chief Complaint: Obesity  Interim History: Patient had done very well in terms of consistency for journaling.  She has trying to get in 1400-1500 calories and 95 or more grams of protein daily. She is getting in 1150-1250 calories daily and is over 105 grams of protein daily. She is exercising frequently and is hungry when she wakes up and if she goes more than 5 hours without eating.  She is wondering if she can increase her injection.  No plans for Thanksgiving currently- if she goes to her cousin's house she is planning on staying mindful.  No plans for upcoming December holidays either.   Krista Cooper is here to discuss her progress with her obesity treatment plan. She is on the keeping a food journal and adhering to recommended goals of 1400-1500 calories and 95 grams of protein and states she is following her eating plan approximately 90 % of the time. She states she is walking 30 minutes 5 times per week.   OBJECTIVE: Visit Diagnoses: Problem List Items Addressed This Visit       Cardiovascular and Mediastinum   Hypertension associated with diabetes (HCC) - Primary   Relevant Medications   tirzepatide  (MOUNJARO ) 7.5 MG/0.5ML Pen     Endocrine   T2DM (type 2 diabetes mellitus) (HCC)   Relevant Medications   tirzepatide  (MOUNJARO ) 7.5 MG/0.5ML Pen     Other   Class 1 obesity with serious comorbidity and body mass index (BMI) of 32.0 to 32.9 in adult   Relevant Medications   tirzepatide  (MOUNJARO ) 7.5 MG/0.5ML Pen   Other Visit Diagnoses       BMI 31.0-31.9,adult           Vitals Temp: 98.2 F (36.8 C) BP: (!) 144/70 Pulse Rate: 84 SpO2: 99 %   Anthropometric Measurements Height: 5' 6 (1.676 m) Weight: 195 lb (88.5 kg) BMI (Calculated): 31.49 Weight at Last Visit: 191 lb Weight Lost Since Last Visit: 0 Weight Gained Since Last Visit: 4 Starting Weight: 202 lb Total Weight Loss (lbs): 7 lb (3.175 kg)   Body Composition  Body Fat %: 42.7 % Fat  Mass (lbs): 83.4 lbs Muscle Mass (lbs): 106.4 lbs Total Body Water (lbs): 75.2 lbs Visceral Fat Rating : 12   Other Clinical Data Today's Visit #: 36 Starting Date: 06/24/21 Comments: 1400-1500/95     ASSESSMENT AND PLAN: Assessment & Plan Type 2 diabetes mellitus with hyperglycemia, without long-term current use of insulin  (HCC) Still experiencing cravings and hunger on current medication dosage.  Will increase Mounjaro  to 7.5mg  and follow up in 1 month to see if patient Is feeling more in control of her indulgent eating.   Hypertension associated with diabetes (HCC) Blood pressure slightly elevated today.  No chest pain, chest pressure or headache.  Continue to monitor BP at upcoming appointments. Class 1 obesity with serious comorbidity and body mass index (BMI) of 32.0 to 32.9 in adult, unspecified obesity type  BMI 31.0-31.9,adult    Diet: Krista Cooper is currently in the action stage of change. As such, her goal is to continue with weight loss efforts and has agreed to keeping a food journal and adhering to recommended goals of 1450-1500 calories and 100 or more grams of protein.   Exercise:  Older adults should determine their level of effort for physical activity relative to their level of fitness.  Behavior Modification:  We discussed the following Behavioral Modification Strategies today: increasing lean protein intake, decreasing simple carbohydrates, meal planning and cooking strategies,  holiday eating strategies, and planning for success.   Return in about 5 weeks (around 04/26/2024) for repeat IC.   She was informed of the importance of frequent follow up visits to maximize her success with intensive lifestyle modifications for her multiple health conditions.  Attestation Statements:   Reviewed by clinician on day of visit: allergies, medications, problem list, medical history, surgical history, family history, social history, and previous encounter notes.      Krista Cho, MD

## 2024-03-29 NOTE — Assessment & Plan Note (Signed)
 Blood pressure slightly elevated today.  No chest pain, chest pressure or headache.  Continue to monitor BP at upcoming appointments.

## 2024-03-29 NOTE — Assessment & Plan Note (Signed)
 Still experiencing cravings and hunger on current medication dosage.  Will increase Mounjaro  to 7.5mg  and follow up in 1 month to see if patient Is feeling more in control of her indulgent eating.

## 2024-03-30 DIAGNOSIS — H3554 Dystrophies primarily involving the retinal pigment epithelium: Secondary | ICD-10-CM | POA: Diagnosis not present

## 2024-03-30 DIAGNOSIS — E119 Type 2 diabetes mellitus without complications: Secondary | ICD-10-CM | POA: Diagnosis not present

## 2024-03-30 DIAGNOSIS — H2513 Age-related nuclear cataract, bilateral: Secondary | ICD-10-CM | POA: Diagnosis not present

## 2024-03-30 LAB — OPHTHALMOLOGY REPORT-SCANNED

## 2024-04-26 ENCOUNTER — Ambulatory Visit (INDEPENDENT_AMBULATORY_CARE_PROVIDER_SITE_OTHER): Payer: Self-pay | Admitting: Family Medicine

## 2024-04-26 ENCOUNTER — Encounter (INDEPENDENT_AMBULATORY_CARE_PROVIDER_SITE_OTHER): Payer: Self-pay | Admitting: Family Medicine

## 2024-04-26 VITALS — BP 127/74 | HR 86 | Temp 98.1°F | Ht 66.0 in | Wt 193.0 lb

## 2024-04-26 DIAGNOSIS — E66811 Obesity, class 1: Secondary | ICD-10-CM

## 2024-04-26 DIAGNOSIS — E1165 Type 2 diabetes mellitus with hyperglycemia: Secondary | ICD-10-CM

## 2024-04-26 DIAGNOSIS — Z6831 Body mass index (BMI) 31.0-31.9, adult: Secondary | ICD-10-CM

## 2024-04-26 DIAGNOSIS — R0602 Shortness of breath: Secondary | ICD-10-CM

## 2024-04-26 DIAGNOSIS — Z6832 Body mass index (BMI) 32.0-32.9, adult: Secondary | ICD-10-CM

## 2024-04-26 DIAGNOSIS — Z7985 Long-term (current) use of injectable non-insulin antidiabetic drugs: Secondary | ICD-10-CM | POA: Diagnosis not present

## 2024-04-26 MED ORDER — TIRZEPATIDE 7.5 MG/0.5ML ~~LOC~~ SOAJ: 7.5000 mg | SUBCUTANEOUS | 1 refills | Status: AC

## 2024-04-26 NOTE — Assessment & Plan Note (Addendum)
 Continue mounjaro  at same dose.  No GI side effects and significant intake control

## 2024-04-26 NOTE — Progress Notes (Signed)
 "  SUBJECTIVE:  Chief Complaint: Obesity  Interim History: Patient here for follow up.  She had a great Thanksgiving and went to 3 separate meals.  Enjoyed herself. She is feeling proud of herself. She is hitting 1480 calories daily and is logging daily on [employer].  Anticipating being with friends and family for Christmastime.  She is spending quite a bit of time with her aunt that is moving soon.  Average protein intake of 115g.  Krista Cooper is here to discuss her progress with her obesity treatment plan. She is on the keeping a food journal and adhering to recommended goals of 1450-1500 calories and 100 grams of protein and states she is following her eating plan approximately 90 % of the time. She states she is exercising 30 minutes 6 times per week- mostly walking and occasionally aerobics.    OBJECTIVE: Visit Diagnoses: Problem List Items Addressed This Visit       Endocrine   T2DM (type 2 diabetes mellitus) (HCC)     Other   Class 1 obesity with serious comorbidity and body mass index (BMI) of 32.0 to 32.9 in adult   Other Visit Diagnoses       SOBOE (shortness of breath on exertion)    -  Primary     BMI 31.0-31.9,adult           Vitals Temp: 98.1 F (36.7 C) BP: 127/74 Pulse Rate: 86 SpO2: 98 %   Anthropometric Measurements Height: 5' 6 (1.676 m) Weight: 193 lb (87.5 kg) BMI (Calculated): 31.17 Weight at Last Visit: 195 lb Weight Lost Since Last Visit: 2 Weight Gained Since Last Visit: 0 Starting Weight: 202 lb Total Weight Loss (lbs): 9 lb (4.082 kg)   Body Composition  Body Fat %: 42.1 % Fat Mass (lbs): 81.4 lbs Muscle Mass (lbs): 106.2 lbs Total Body Water (lbs): 75.6 lbs Visceral Fat Rating : 12   Other Clinical Data RMR: [id number] Today's Visit #: 37 Starting Date: 06/24/21 Comments: 1450-1500/100     ASSESSMENT AND PLAN: Assessment & Plan SOBOE (shortness of breath on exertion) Resting metabolic rate on 11/17/74 of 2117.  Symptoms have  not improved since that time.  Resting metabolic rate by indirect calorimetry today of 1858.  Can continue current calorie and protein goals outlined before. Type 2 diabetes mellitus with hyperglycemia, without long-term current use of insulin  (HCC) Continue mounjaro  at same dose.  No GI side effects and significant intake control BMI 31.0-31.9,adult  Class 1 obesity with serious comorbidity and body mass index (BMI) of 32.0 to 32.9 in adult, unspecified obesity type    Diet: Krista Cooper is currently in the action stage of change. As such, her goal is to continue with weight loss efforts and has agreed to keeping a food journal and adhering to recommended goals of 1450-1550 calories and 100 or more grams of protein daily.   Exercise:  Older adults should determine their level of effort for physical activity relative to their level of fitness.  She will continue walking weather permitting.  Behavior Modification:  We discussed the following Behavioral Modification Strategies today: increasing lean protein intake, decreasing simple carbohydrates, holiday eating strategies, and planning for success.   Return in about 5 weeks (around 05/31/2024).   She was informed of the importance of frequent follow up visits to maximize her success with intensive lifestyle modifications for her multiple health conditions.  Attestation Statements:   Reviewed by clinician on day of visit: allergies, medications, problem list, medical history,  surgical history, family history, social history, and previous encounter notes.     Krista Cho, MD "

## 2024-05-02 ENCOUNTER — Telehealth (INDEPENDENT_AMBULATORY_CARE_PROVIDER_SITE_OTHER): Payer: Self-pay

## 2024-05-02 NOTE — Telephone Encounter (Signed)
 This is to inform you that your Prior Authorization request for the above members Mounjaro  7.5MG /0.5ML [place] [facility] has been approved. If you are changing the member's therapy the previously approved therapy will be canceled and replaced. The authorization is valid from 04/02/2024 through 05/02/2025. A letter of explanation will also be mailed to the patient.

## 2024-05-24 ENCOUNTER — Encounter (INDEPENDENT_AMBULATORY_CARE_PROVIDER_SITE_OTHER): Payer: Self-pay | Admitting: Family Medicine

## 2024-05-24 ENCOUNTER — Ambulatory Visit (INDEPENDENT_AMBULATORY_CARE_PROVIDER_SITE_OTHER): Admitting: Family Medicine

## 2024-05-24 VITALS — BP 131/71 | HR 86 | Temp 98.0°F | Ht 66.0 in | Wt 197.0 lb

## 2024-05-24 DIAGNOSIS — E785 Hyperlipidemia, unspecified: Secondary | ICD-10-CM

## 2024-05-24 DIAGNOSIS — Z6831 Body mass index (BMI) 31.0-31.9, adult: Secondary | ICD-10-CM | POA: Diagnosis not present

## 2024-05-24 DIAGNOSIS — Z7985 Long-term (current) use of injectable non-insulin antidiabetic drugs: Secondary | ICD-10-CM

## 2024-05-24 DIAGNOSIS — E1165 Type 2 diabetes mellitus with hyperglycemia: Secondary | ICD-10-CM

## 2024-05-24 DIAGNOSIS — Z6832 Body mass index (BMI) 32.0-32.9, adult: Secondary | ICD-10-CM

## 2024-05-24 DIAGNOSIS — E66811 Obesity, class 1: Secondary | ICD-10-CM | POA: Diagnosis not present

## 2024-05-24 DIAGNOSIS — E1169 Type 2 diabetes mellitus with other specified complication: Secondary | ICD-10-CM | POA: Diagnosis not present

## 2024-05-24 NOTE — Assessment & Plan Note (Addendum)
 Taking Crestor  without any side effects mentioned.  Will continue Crestor  as prescribed.

## 2024-05-24 NOTE — Progress Notes (Signed)
 "  SUBJECTIVE:  Chief Complaint: Obesity  Interim History: Patient here for follow up after the holidays.  Reports consistency of following meal plan and getting 100 grams of protein in within 1450-1550 calories. She had a great holiday season- got to be with friends and family.  She did have a few more indulgent food choices than she did prior.  She has been walking 30 minutes daily.  She went back to aerobics yesterday. Overall feels she is doing well on Mounjaro  but occasionally needs carbonated beverage to help get feeling of reflux under control. Food logs reviewed and patient is getting in 1400 calories at least and then 100 grams of protein on average.  Krista Cooper is here to discuss her progress with her obesity treatment plan. She is on the keeping a food journal and adhering to recommended goals of 1450-1550 calories and 100 grams of protein and states she is following her eating plan approximately 90 % of the time. She states she is walking 30 minutes 7 times per week.   OBJECTIVE: Visit Diagnoses: Problem List Items Addressed This Visit       Endocrine   Hyperlipidemia associated with type 2 diabetes mellitus (HCC)   T2DM (type 2 diabetes mellitus) (HCC) - Primary     Other   Class 1 obesity with serious comorbidity and body mass index (BMI) of 32.0 to 32.9 in adult   Other Visit Diagnoses       BMI 31.0-31.9,adult           Vitals Temp: 98 F (36.7 C) BP: 131/71 Pulse Rate: 86 SpO2: 99 %   Anthropometric Measurements Height: 5' 6 (1.676 m) Weight: 197 lb (89.4 kg) BMI (Calculated): 31.81 Weight at Last Visit: 193 lb Weight Lost Since Last Visit: 0 Weight Gained Since Last Visit: 4 Starting Weight: 202 lb Total Weight Loss (lbs): 5 lb (2.268 kg)   Body Composition  Body Fat %: 42.8 % Fat Mass (lbs): 84.4 lbs Muscle Mass (lbs): 107.2 lbs Total Body Water (lbs): 76 lbs Visceral Fat Rating : 12   Other Clinical Data Today's Visit #: 38 Starting Date:  06/24/21 Comments: 1450-1550     ASSESSMENT AND PLAN: Assessment & Plan Type 2 diabetes mellitus with hyperglycemia, without long-term current use of insulin  (HCC) Patient is doing well on Mounjaro  and is finding that she has to eat smaller amounts more frequently in order to get total goal intake.  Follow-up at next appointment.  No change in dosage at this time.  No refill needed. Hyperlipidemia associated with type 2 diabetes mellitus (HCC) Taking Crestor  without any side effects mentioned.  Will continue Crestor  as prescribed. BMI 31.0-31.9,adult  Class 1 obesity with serious comorbidity and body mass index (BMI) of 32.0 to 32.9 in adult, unspecified obesity type    Diet: Nataki is currently in the action stage of change. As such, her goal is to continue with weight loss efforts and has agreed to keeping a food journal and adhering to recommended goals of 1450-1500 calories and 100 or more grams of protein.   Exercise:  Older adults should determine their level of effort for physical activity relative to their level of fitness.  Behavior Modification:  We discussed the following Behavioral Modification Strategies today: increasing lean protein intake, meal planning and cooking strategies, planning for success, and keep a strict food journal.   Return in about 5 weeks (around 06/28/2024).   She was informed of the importance of frequent follow up visits to maximize  her success with intensive lifestyle modifications for her multiple health conditions.  Attestation Statements:   Reviewed by clinician on day of visit: allergies, medications, problem list, medical history, surgical history, family history, social history, and previous encounter notes.   Adelita Cho, MD "

## 2024-05-28 NOTE — Assessment & Plan Note (Signed)
 Patient is doing well on Mounjaro  and is finding that she has to eat smaller amounts more frequently in order to get total goal intake.  Follow-up at next appointment.  No change in dosage at this time.  No refill needed.

## 2024-06-02 NOTE — Progress Notes (Signed)
 Krista Cooper                                          MRN: [id number]   06/02/2024   The [facility] Quality Team Specialist reviewed this patient medical record for the purposes of chart review for care gap closure. The following were reviewed: abstraction for care gap closure-controlling blood pressure.    [facility] Quality Team

## 2024-06-28 ENCOUNTER — Ambulatory Visit (INDEPENDENT_AMBULATORY_CARE_PROVIDER_SITE_OTHER): Admitting: Family Medicine

## 2024-07-26 ENCOUNTER — Ambulatory Visit (INDEPENDENT_AMBULATORY_CARE_PROVIDER_SITE_OTHER): Admitting: Family Medicine

## 2024-08-28 ENCOUNTER — Ambulatory Visit: Admitting: Family Medicine

## 2025-01-30 ENCOUNTER — Ambulatory Visit
